# Patient Record
Sex: Female | Born: 1975 | Race: Black or African American | Hispanic: No | Marital: Married | Smoking: Never smoker
Health system: Southern US, Community
[De-identification: ages and names within clinical notes are randomized; demographics above are authoritative.]

## PROBLEM LIST (undated history)

## (undated) DIAGNOSIS — N289 Disorder of kidney and ureter, unspecified: Secondary | ICD-10-CM

## (undated) DIAGNOSIS — D649 Anemia, unspecified: Secondary | ICD-10-CM

## (undated) DIAGNOSIS — I10 Essential (primary) hypertension: Secondary | ICD-10-CM

## (undated) DIAGNOSIS — E785 Hyperlipidemia, unspecified: Secondary | ICD-10-CM

## (undated) DIAGNOSIS — N200 Calculus of kidney: Secondary | ICD-10-CM

## (undated) DIAGNOSIS — R7303 Prediabetes: Secondary | ICD-10-CM

## (undated) HISTORY — PX: EYE SURGERY: SHX253

## (undated) HISTORY — DX: Anemia, unspecified: D64.9

## (undated) HISTORY — PX: OTHER SURGICAL HISTORY: SHX169

## (undated) HISTORY — DX: Hyperlipidemia, unspecified: E78.5

---

## 1997-09-16 HISTORY — PX: BREAST REDUCTION SURGERY: SHX8

## 1998-06-12 ENCOUNTER — Ambulatory Visit (HOSPITAL_BASED_OUTPATIENT_CLINIC_OR_DEPARTMENT_OTHER): Admission: RE | Admit: 1998-06-12 | Discharge: 1998-06-12 | Payer: Self-pay | Admitting: Specialist

## 1998-10-30 ENCOUNTER — Emergency Department (HOSPITAL_COMMUNITY): Admission: EM | Admit: 1998-10-30 | Discharge: 1998-10-30 | Payer: Self-pay | Admitting: Emergency Medicine

## 2000-01-16 ENCOUNTER — Encounter: Admission: RE | Admit: 2000-01-16 | Discharge: 2000-01-16 | Payer: Self-pay | Admitting: Specialist

## 2000-01-16 ENCOUNTER — Encounter: Payer: Self-pay | Admitting: Specialist

## 2001-04-10 ENCOUNTER — Emergency Department (HOSPITAL_COMMUNITY): Admission: EM | Admit: 2001-04-10 | Discharge: 2001-04-10 | Payer: Self-pay | Admitting: *Deleted

## 2001-04-10 ENCOUNTER — Encounter: Payer: Self-pay | Admitting: Emergency Medicine

## 2002-12-14 ENCOUNTER — Other Ambulatory Visit: Admission: RE | Admit: 2002-12-14 | Discharge: 2002-12-14 | Payer: Self-pay | Admitting: *Deleted

## 2002-12-25 ENCOUNTER — Inpatient Hospital Stay (HOSPITAL_COMMUNITY): Admission: AD | Admit: 2002-12-25 | Discharge: 2002-12-25 | Payer: Self-pay | Admitting: *Deleted

## 2002-12-25 ENCOUNTER — Encounter: Payer: Self-pay | Admitting: *Deleted

## 2003-07-18 ENCOUNTER — Encounter: Admission: RE | Admit: 2003-07-18 | Discharge: 2003-07-18 | Payer: Self-pay | Admitting: Obstetrics and Gynecology

## 2003-07-18 ENCOUNTER — Inpatient Hospital Stay (HOSPITAL_COMMUNITY): Admission: AD | Admit: 2003-07-18 | Discharge: 2003-07-18 | Payer: Self-pay | Admitting: *Deleted

## 2003-08-05 ENCOUNTER — Encounter (INDEPENDENT_AMBULATORY_CARE_PROVIDER_SITE_OTHER): Payer: Self-pay | Admitting: Specialist

## 2003-08-05 ENCOUNTER — Inpatient Hospital Stay (HOSPITAL_COMMUNITY): Admission: RE | Admit: 2003-08-05 | Discharge: 2003-08-08 | Payer: Self-pay | Admitting: Obstetrics and Gynecology

## 2003-08-09 ENCOUNTER — Inpatient Hospital Stay (HOSPITAL_COMMUNITY): Admission: AD | Admit: 2003-08-09 | Discharge: 2003-08-09 | Payer: Self-pay | Admitting: Obstetrics and Gynecology

## 2003-08-13 ENCOUNTER — Inpatient Hospital Stay (HOSPITAL_COMMUNITY): Admission: AD | Admit: 2003-08-13 | Discharge: 2003-08-13 | Payer: Self-pay | Admitting: Obstetrics and Gynecology

## 2003-09-06 ENCOUNTER — Other Ambulatory Visit: Admission: RE | Admit: 2003-09-06 | Discharge: 2003-09-06 | Payer: Self-pay | Admitting: Obstetrics and Gynecology

## 2004-09-16 LAB — CONVERTED CEMR LAB: Pap Smear: NORMAL

## 2008-09-16 DIAGNOSIS — I10 Essential (primary) hypertension: Secondary | ICD-10-CM

## 2008-09-16 HISTORY — DX: Essential (primary) hypertension: I10

## 2009-09-04 ENCOUNTER — Emergency Department (HOSPITAL_BASED_OUTPATIENT_CLINIC_OR_DEPARTMENT_OTHER): Admission: EM | Admit: 2009-09-04 | Discharge: 2009-09-04 | Payer: Self-pay | Admitting: Emergency Medicine

## 2009-09-07 ENCOUNTER — Ambulatory Visit: Payer: Self-pay | Admitting: Internal Medicine

## 2009-09-07 DIAGNOSIS — I10 Essential (primary) hypertension: Secondary | ICD-10-CM

## 2009-09-07 HISTORY — DX: Essential (primary) hypertension: I10

## 2009-10-11 ENCOUNTER — Ambulatory Visit: Payer: Self-pay | Admitting: Family Medicine

## 2009-10-11 DIAGNOSIS — E785 Hyperlipidemia, unspecified: Secondary | ICD-10-CM | POA: Insufficient documentation

## 2009-10-11 DIAGNOSIS — E669 Obesity, unspecified: Secondary | ICD-10-CM

## 2009-10-11 HISTORY — DX: Obesity, unspecified: E66.9

## 2009-10-11 HISTORY — DX: Hyperlipidemia, unspecified: E78.5

## 2009-11-22 ENCOUNTER — Ambulatory Visit: Payer: Self-pay | Admitting: Family Medicine

## 2009-11-23 LAB — CONVERTED CEMR LAB
BUN: 12 mg/dL (ref 6–23)
CO2: 30 meq/L (ref 19–32)
Calcium: 9.2 mg/dL (ref 8.4–10.5)
Chloride: 106 meq/L (ref 96–112)
Cholesterol: 197 mg/dL (ref 0–200)
Creatinine, Ser: 0.8 mg/dL (ref 0.4–1.2)
GFR calc non Af Amer: 106.93 mL/min (ref >60)
Glucose, Bld: 88 mg/dL (ref 70–99)
HDL: 40.4 mg/dL (ref >39.00)
LDL Cholesterol: 146 mg/dL — ABNORMAL HIGH (ref 0–99)
Potassium: 4.2 meq/L (ref 3.5–5.1)
Sodium: 138 meq/L (ref 135–145)
Total CHOL/HDL Ratio: 5
Triglycerides: 51 mg/dL (ref 0.0–149.0)
VLDL: 10.2 mg/dL (ref 0.0–40.0)

## 2009-12-01 ENCOUNTER — Encounter (HOSPITAL_BASED_OUTPATIENT_CLINIC_OR_DEPARTMENT_OTHER): Admission: RE | Admit: 2009-12-01 | Discharge: 2010-01-10 | Payer: Self-pay | Admitting: Internal Medicine

## 2009-12-07 ENCOUNTER — Ambulatory Visit: Payer: Self-pay | Admitting: Family Medicine

## 2009-12-07 DIAGNOSIS — J019 Acute sinusitis, unspecified: Secondary | ICD-10-CM

## 2009-12-07 HISTORY — DX: Acute sinusitis, unspecified: J01.90

## 2010-01-02 ENCOUNTER — Encounter: Payer: Self-pay | Admitting: Family Medicine

## 2010-01-08 ENCOUNTER — Ambulatory Visit: Payer: Self-pay | Admitting: Family Medicine

## 2010-01-08 DIAGNOSIS — L03039 Cellulitis of unspecified toe: Secondary | ICD-10-CM

## 2010-01-08 DIAGNOSIS — L02619 Cutaneous abscess of unspecified foot: Secondary | ICD-10-CM

## 2010-01-08 HISTORY — DX: Cutaneous abscess of unspecified foot: L03.039

## 2010-01-08 HISTORY — DX: Cutaneous abscess of unspecified foot: L02.619

## 2010-01-18 ENCOUNTER — Ambulatory Visit: Payer: Self-pay | Admitting: Family Medicine

## 2010-01-18 LAB — CONVERTED CEMR LAB
Cholesterol, target level: 200 mg/dL
HDL goal, serum: 40 mg/dL
LDL Goal: 160 mg/dL

## 2010-02-08 ENCOUNTER — Ambulatory Visit: Payer: Self-pay | Admitting: Family Medicine

## 2010-02-08 DIAGNOSIS — L600 Ingrowing nail: Secondary | ICD-10-CM

## 2010-02-08 HISTORY — DX: Ingrowing nail: L60.0

## 2010-02-09 ENCOUNTER — Encounter: Payer: Self-pay | Admitting: Family Medicine

## 2010-02-14 ENCOUNTER — Ambulatory Visit: Payer: Self-pay | Admitting: Family Medicine

## 2010-02-14 DIAGNOSIS — J029 Acute pharyngitis, unspecified: Secondary | ICD-10-CM | POA: Insufficient documentation

## 2010-02-14 HISTORY — DX: Acute pharyngitis, unspecified: J02.9

## 2010-02-14 LAB — CONVERTED CEMR LAB: Rapid Strep: NEGATIVE

## 2010-07-16 ENCOUNTER — Ambulatory Visit: Payer: Self-pay | Admitting: Family Medicine

## 2010-07-16 DIAGNOSIS — J209 Acute bronchitis, unspecified: Secondary | ICD-10-CM

## 2010-07-16 HISTORY — DX: Acute bronchitis, unspecified: J20.9

## 2010-07-19 ENCOUNTER — Ambulatory Visit: Payer: Self-pay | Admitting: Family Medicine

## 2010-07-27 ENCOUNTER — Ambulatory Visit: Payer: Self-pay | Admitting: Family Medicine

## 2010-07-27 DIAGNOSIS — M549 Dorsalgia, unspecified: Secondary | ICD-10-CM

## 2010-07-27 HISTORY — DX: Dorsalgia, unspecified: M54.9

## 2010-08-03 ENCOUNTER — Ambulatory Visit: Payer: Self-pay | Admitting: Family Medicine

## 2010-08-03 DIAGNOSIS — R51 Headache: Secondary | ICD-10-CM

## 2010-08-03 DIAGNOSIS — R209 Unspecified disturbances of skin sensation: Secondary | ICD-10-CM

## 2010-08-03 DIAGNOSIS — R519 Headache, unspecified: Secondary | ICD-10-CM | POA: Insufficient documentation

## 2010-08-03 HISTORY — DX: Headache: R51

## 2010-08-03 HISTORY — DX: Unspecified disturbances of skin sensation: R20.9

## 2010-08-07 ENCOUNTER — Encounter: Admission: RE | Admit: 2010-08-07 | Discharge: 2010-08-07 | Payer: Self-pay | Admitting: Family Medicine

## 2010-08-08 ENCOUNTER — Telehealth: Payer: Self-pay | Admitting: Family Medicine

## 2010-08-27 ENCOUNTER — Encounter: Payer: Self-pay | Admitting: Family Medicine

## 2010-10-16 NOTE — Assessment & Plan Note (Signed)
Summary: consult re: head, neck and upper back pain for 1 wk/cjr   Vital Signs:  Patient profile:   35 year old female Menstrual status:  irregular Temp:     98.7 degrees F oral BP sitting:   110 / 80  (left arm) Cuff size:   large  Vitals Entered By: Sid Falcon LPN (July 27, 2010 11:02 AM)  History of Present Illness: Here with upper back and neck muscle tightness and soreness past few days. Started after falling asleep on couch. No headache.  No radiculopathy symptoms. Tried NSAIDs without relief.  Moist heat helps some. URI sxs have improved.  Allergies: 1)  Asa 2)  Santyl (Collagenase)  Past History:  Past Medical History: Last updated: 10/11/2009 Hypertension Frequent headaches Hyperlipidemia  Review of Systems  The patient denies fever, hoarseness, chest pain, syncope, dyspnea on exertion, headaches, and muscle weakness.    Physical Exam  General:  Well-developed,well-nourished,in no acute distress; alert,appropriate and cooperative throughout examination Head:  Normocephalic and atraumatic without obvious abnormalities. No apparent alopecia or balding. Mouth:  Oral mucosa and oropharynx without lesions or exudates.  Teeth in good repair. Neck:  full ROM.  Tender paracervical muscles bilateral and trapezius muscles. Lungs:  Normal respiratory effort, chest expands symmetrically. Lungs are clear to auscultation, no crackles or wheezes. Heart:  Normal rate and regular rhythm. S1 and S2 normal without gallop, murmur, click, rub or other extra sounds. Neurologic:  alert & oriented X3, cranial nerves II-XII intact, and strength normal in all extremities.     Impression & Recommendations:  Problem # 1:  BACK PAIN, UPPER (ICD-724.5) suspect muscular.  Muscle relaxer and she is scheduled for muscle massage. Her updated medication list for this problem includes:    Cyclobenzaprine Hcl 5 Mg Tabs (Cyclobenzaprine hcl) .Marland Kitchen... 1-2 by mouth q 8 hours as needed muscle  spasm  Complete Medication List: 1)  Bactroban 2 % Oint (Mupirocin) .... Apply to affected rash two times a day 2)  Allerx-d 120-2.5 Mg Xr12h-tab (Pseudoephedrine-methscopolamin) .... One by mouth two times a day 3)  Cyclobenzaprine Hcl 5 Mg Tabs (Cyclobenzaprine hcl) .Marland Kitchen.. 1-2 by mouth q 8 hours as needed muscle spasm  Patient Instructions: 1)  Continue with moist heat and muscle massage would likely be helpful. Prescriptions: CYCLOBENZAPRINE HCL 5 MG TABS (CYCLOBENZAPRINE HCL) 1-2 by mouth q 8 hours as needed muscle spasm  #30 x 1   Entered and Authorized by:   Evelena Peat MD   Signed by:   Evelena Peat MD on 07/27/2010   Method used:   Electronically to        [id number] ([facility]       [address]       [place], [place]  [address]       Ph: [phone] or [phone]       Fax: [phone]   RxID:   [id number]    Orders Added: 1)  Est. Patient Level III [[address]]

## 2010-10-16 NOTE — Assessment & Plan Note (Signed)
Summary: new to [facility] per dr Paublo Warshawsky//ccm   Vital Signs:  Patient profile:   35 year old female Menstrual status:  irregular LMP:     08/23/2009 Height:      64.75 inches Weight:      273 pounds Temp:     98.8 degrees F oral Pulse rate:   80 / minute Pulse rhythm:   regular Resp:     12 per minute BP sitting:   130 / 82  (left arm) Cuff size:   large  Vitals Entered By: Sid Falcon LPN (October 11, 2009 11:23 AM) CC: New pt to establish, BP concerns, Rt leg dog bite, Hypertension Management Is Patient Diabetic? No LMP (date): 08/23/2009     Menstrual Status irregular Enter LMP: 08/23/2009 Last PAP Result normal   History of Present Illness: Patient here to establish care.  Patient new to this clinic. Reported history of hypertension. At one point treated with Avapro but apparently still had high blood pressure readings. Currently on no medications. Not monitoring blood pressure regularly at home. No regular exercise but plans to start soon.  REported hx hyperlipidemia but  labs not checked in few years.  Patient has history of breast reduction surgery 1999 and C-section 2004. Reported history of hyperlipidemia. Also allergy to aspirin but can take Advil and Aleve without difficulty.  Family history signif for both parents with hyperlipidemia and hypertension. Mother with type 2 diabetes.  Pt reports possibly mildly elevated glucose in past but not dxed with diabetes.  No urine freq or thirst.  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 No history of alcohol use. Nonsmoker.  Hypertension History:      She denies headache, chest pain, palpitations, dyspnea with exertion, orthopnea, PND, peripheral edema, visual symptoms, and syncope.        Positive major cardiovascular risk factors include hyperlipidemia and hypertension.  Negative major cardiovascular risk factors include female age less than 39 years old, no history of diabetes, negative family history  for ischemic heart disease, and non-tobacco-user status.        Further assessment for target organ damage reveals no history of ASHD, cardiac end-organ damage (CHF/LVH), stroke/TIA, peripheral vascular disease, renal insufficiency, or hypertensive retinopathy.     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 Alcohol use-no Drug use-no Regular exercise-yes pregnancies 2 1 live birth 1 miscarriage  Risk Factors: Alcohol Use: 0 (09/07/2009) Exercise: yes (09/07/2009)  Risk Factors: Smoking Status: never (09/07/2009)  Past Medical History: Hypertension Frequent headaches Hyperlipidemia  Past Surgical History: Caesarean section 2004 Breast reduction 1999  Family History: Family History Diabetes 1st degree relative, mother, grandmother Family History High cholesterol, both parents Family History Hypertension, both parents, grandmother Family History of Arthritis, mother Colon cancer, uncle Stroke, grandmother  Social History: Occupation: Airline pilot Divorced Never Smoked Alcohol use-no Drug use-no Regular exercise-yes pregnancies 2 1 live birth 1 miscarriage  Review of Systems       The patient complains of weight gain.  The patient denies anorexia, fever, weight loss, chest pain, syncope, dyspnea on exertion, peripheral edema, headaches, and vision loss.    Physical Exam  General:  patient is alert pleasant obese in no distress Eyes:  No corneal or conjunctival  inflammation noted. EOMI. Perrla. Funduscopic exam benign, without hemorrhages, exudates or papilledema. Vision grossly  normal. Mouth:  Oral mucosa and oropharynx without lesions or exudates.  Teeth in good repair. Neck:  No deformities, masses, or tenderness noted. Lungs:  Normal respiratory effort, chest expands symmetrically. Lungs are clear to auscultation, no crackles or wheezes. Heart:  Normal rate and regular rhythm. S1 and S2 normal without gallop, murmur, click, rub or other extra sounds. Extremities:  no edema   Impression & Recommendations:  Problem # 1:  HYPERTENSION (ICD-401.9) BP actually stable today off meds. Discussed lifestyle interventions in detail.  Needs to lose weight and establish exercise. The following medications were removed from the medication list:    Azor 5-20 Mg Tabs (Amlodipine-olmesartan) ..... One by mouth once daily for high blood pressure  Problem # 2:  HYPERLIPIDEMIA (ICD-272.4) F/U within 2 months for fasting labs incl lipids.  Problem # 3:  OBESITY (ICD-278.00) At risk for type 2 dm and multiple other co-morbidities.  Pt to start exercise.  Extensive nutrition counseling in past.  Hypertension Assessment/Plan:      The patient's hypertensive risk group is category B: At least one risk factor (excluding diabetes) with no target organ damage.  Today's blood pressure is 130/82.  Her blood pressure goal is < 140/90.  Patient Instructions: 1)  Please schedule a follow-up appointment in 2 months.  2)  BMP prior to visit, ICD-9: 401.9 3)  Lipid panel prior to visit ICD-9 : 272.4 4)  Limit your Sodium(salt) .  5)  It is important that you exercise reguarly at least 20 minutes 5 times a week. If you develop chest pain, have severe difficulty breathing, or feel very tired, stop exercising immediately and seek medical attention.  6)  You need to lose weight. Consider a lower calorie diet and regular exercise.   Preventive Care Screening  Pap Smear:    Date:  09/16/2004    Results:  normal

## 2010-10-16 NOTE — Assessment & Plan Note (Signed)
Summary: SINUS ISSUES // RS   Vital Signs:  Patient profile:   35 year old female Menstrual status:  irregular Temp:     98.1 degrees F oral BP sitting:   130 / 94  (left arm) Cuff size:   large  Vitals Entered By: Sid Falcon LPN (July 16, 2010 1:53 PM)  History of Present Illness: Patient is seen with a one-week history of cough productive of brown sputum. No fever. Persistent postnasal sinus drainage. Ringing sensation in both ears but no hearing loss. Robitussin without relief. Nonsmoker.  No alleviating factors.  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  Otoscopic examination reveals clear canals, tympanic membranes are intact bilaterally without bulging, retraction, inflammation or discharge. Hearing is grossly normal bilaterally. Mouth:  Oral mucosa and oropharynx without lesions or exudates.   Teeth in good repair. Neck:  No deformities, masses, or tenderness noted. Lungs:  Normal respiratory effort, chest expands symmetrically. Lungs are clear to auscultation, no crackles or wheezes. Heart:  Normal rate and regular rhythm. S1 and S2 normal without gallop, murmur, click, rub or other extra sounds.   Impression & Recommendations:  Problem # 1:  BRONCHITIS, ACUTE (ICD-466.0)  The following medications were removed from the medication list:    Cephalexin 500 Mg Caps (Cephalexin) ..... One by mouth three times a day for 10 days Her updated medication list for this problem includes:    Azithromycin 250 Mg Tabs (Azithromycin) .Marland Kitchen... 2 by mouth today then one by mouth once daily for 4 days    Hydrocodone-homatropine 5-1.5 Mg/74ml Syrp (Hydrocodone-homatropine) ..... One tsp by mouth q 4-6 hours as needed cough  Complete Medication List: 1)  Bactroban 2 % Oint (Mupirocin) .... Apply to affected rash two times a day 2)  Azithromycin 250 Mg Tabs (Azithromycin) .... 2 by mouth today then one by mouth once daily for 4 days 3)  Hydrocodone-homatropine 5-1.5 Mg/40ml Syrp (Hydrocodone-homatropine) .... One tsp by mouth q 4-6 hours as needed cough  Patient Instructions: 1)  Acute Bronchitis symptoms for less then 10 days are not  helped by antibiotics. Take over the counter cough medications. Call if no improvement in 5-7 days, sooner if increasing cough, fever, or new symptoms ( shortness of breath, chest pain) .  Prescriptions: HYDROCODONE-HOMATROPINE 5-1.5 MG/5ML SYRP (HYDROCODONE-HOMATROPINE) one tsp by mouth q 4-6 hours as needed cough  #120 ml x 0   Entered and Authorized by:   Evelena Peat MD   Signed by:   Evelena Peat MD on 07/16/2010   Method used:   Print then Give to Patient   RxID:   [id number] AZITHROMYCIN 250 MG TABS (AZITHROMYCIN) 2  by mouth today then one by mouth once daily for 4 days  #6 x 0   Entered and Authorized by:   Evelena Peat MD   Signed by:   Evelena Peat MD on 07/16/2010   Method used:   Electronically to        [id number]       [address]       [place], [place]  [address]       Ph: [phone] or [phone]       Fax: [phone]   RxID:   [id number]    Orders Added: 1)  Est. Patient Level III [[address]]

## 2010-10-16 NOTE — Assessment & Plan Note (Signed)
Summary: neck,shoulder and head pain/cjr   Vital Signs:  Patient profile:   35 year old female Menstrual status:  irregular Temp:     98.3 degrees F oral BP sitting:   110 / 84  (left arm) Cuff size:   large  Vitals Entered By: Sid Falcon LPN (August 03, 2010 3:25 PM)  History of Present Illness: Patient seen with persistent neck shoulder upper back and occipital pain. She's had some progressive occipital headache. Pain seems to radiate occipital area toward the upper thoracic region. She's tried muscle relaxer without much improvement also massage. She denies nausea, vomiting, or visual changes.  Patient also relates some migratory intermittent numbness right upper extremity and bilateral lower extremities. No incontinence or weakness. Has also had neurologic symptoms of intermittent tinnitus without hearing changes.  over the past week she's had some intermittent episodes of involuntary movements involving clonus type contraction of the upper extremity mostly right upper extremity. Again no weakness. She denies any symptoms of ataxia. No visual changes.  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    Physical Exam  General:  Well-developed,well-nourished,in no acute distress; alert,appropriate and cooperative throughout examination Head:  Normocephalic and atraumatic without obvious abnormalities. No apparent alopecia or balding. Eyes:  No corneal or conjunctival inflammation noted. EOMI. Perrla. Funduscopic exam benign, without hemorrhages, exudates or papilledema. Vision grossly normal. Ears:  External ear exam shows no significant lesions or deformities.  Otoscopic examination reveals clear canals, tympanic membranes are intact bilaterally without bulging, retraction, inflammation or discharge. Hearing is grossly normal bilaterally. Mouth:  Oral mucosa and oropharynx without lesions or exudates.  Teeth in good repair. Neck:  No deformities, masses, or tenderness noted. Lungs:  Normal respiratory effort, chest expands symmetrically. Lungs are clear to auscultation, no crackles or wheezes. Heart:  Normal rate and regular rhythm. S1 and S2 normal without gallop, murmur, click, rub or other extra sounds. Neurologic:  alert & oriented X3, cranial nerves II-XII intact, strength normal in all extremities, gait normal, DTRs symmetrical and normal, and finger-to-nose normal.   Psych:  normally interactive, good eye contact, not anxious appearing, and not depressed appearing.     Impression & Recommendations:  Problem # 1:  HEADACHE (ICD-784.0) progressive atypical type headache. Orders: Radiology Referral (Radiology)  Her updated medication list for this problem includes:    Naproxen 500 Mg Tabs (Naproxen) ..... One by mouth two times a day with food as needed headache and back pain  Problem # 2:   PARESTHESIA (ICD-782.0) Assessment: New She has some migratory, intermittnent numbness.  In combination with above, will check MRI to further investigate. Orders: Radiology Referral (Radiology)  Problem # 3:  BACK  PAIN, UPPER (ICD-724.5)  Her updated medication list for this problem includes:    Cyclobenzaprine Hcl 5 Mg Tabs (Cyclobenzaprine hcl) .Marland Kitchen... 1-2 by mouth q 8 hours as needed muscle spasm    Naproxen 500 Mg Tabs (Naproxen) ..... One by mouth two times a day with food as needed headache and back pain  Complete Medication List: 1)  Bactroban 2 % Oint (Mupirocin) .... Apply to affected rash two times a day 2)  Allerx-d 120-2.5 Mg Xr12h-tab (Pseudoephedrine-methscopolamin) .... One by mouth two times a day 3)  Cyclobenzaprine Hcl 5 Mg Tabs (Cyclobenzaprine hcl) .Marland Kitchen.. 1-2 by mouth q 8 hours as needed muscle spasm 4)  Naproxen 500 Mg Tabs (Naproxen) .... One by mouth two times a day with food as needed headache and back pain  Patient Instructions: 1)  We will call you regarding MRI appt. Prescriptions: NAPROXEN 500 MG TABS (NAPROXEN) one by mouth two times a day with food as needed headache and back pain  #40 x 0   Entered and Authorized by:   Evelena Peat MD   Signed by:   Evelena Peat MD on 08/03/2010   Method used:   Electronically to        [id number]       [address]       [place], [place]  [address]       Ph: [phone] or [phone]       Fax: [phone]   RxID:   [id number]    Orders Added: 1)  Radiology Referral [Radiology] 2)  Est. Patient Level IV [14431]

## 2010-10-16 NOTE — Assessment & Plan Note (Signed)
Summary: not felling well//ccm   Vital Signs:  Patient profile:   35 year old female Menstrual status:  irregular Temp:     98.3 degrees F oral BP sitting:   110 / 80  (left arm) Cuff size:   large  Vitals Entered By: Sid Falcon LPN (July 19, 2010 12:03 PM)  History of Present Illness: Just seen with acute bronchitis. Cough is somewhat better but in today b/o some tinnitus which is intermittent.  No hearing loss, ear pain, or vertigo. Does have signif nasal cong and some difficulty sleeping sec to  ringing in ears.  No ASA use.  Allergies: 1)  Asa 2)  Santyl (Collagenase)  Past History:  Past Medical History: Last updated: 10/11/2009 Hypertension Frequent headaches Hyperlipidemia  Review of Systems  The patient denies fever, vision loss, decreased hearing, hoarseness, and chest pain.    Physical Exam  General:  Well-developed,well-nourished,in no acute distress; alert,appropriate and cooperative throughout examination Eyes:  No corneal or conjunctival inflammation noted. EOMI. Perrla. Funduscopic exam benign, without hemorrhages, exudates or papilledema. Vision grossly normal. Ears:  External ear exam shows no significant lesions or deformities.  Otoscopic examination reveals clear canals, tympanic membranes are intact bilaterally without bulging, retraction, inflammation or discharge. Hearing is grossly normal bilaterally. Nose:  External nasal examination shows no deformity or inflammation. Nasal mucosa are pink and moist without lesions or exudates. Mouth:  Oral mucosa and oropharynx without lesions or exudates.  Teeth in good repair. Lungs:  Normal respiratory effort, chest expands symmetrically. Lungs are clear to auscultation, no crackles or wheezes. Heart:  Normal rate and regular rhythm. S1 and S2 normal without gallop, murmur, click, rub or other extra sounds.   Impression & Recommendations:  Problem # 1:  TINNITUS (ICD-388.30) ?related to recent URI.   No hearing loss.  Try Allerx D for congestion  Complete Medication List: 1)  Bactroban 2 % Oint (Mupirocin) .... Apply to affected rash two times a day 2)  Azithromycin 250 Mg Tabs (Azithromycin) .... 2 by mouth today then one by mouth once daily for 4 days 3)  Hydrocodone-homatropine 5-1.5 Mg/64ml Syrp (Hydrocodone-homatropine) .... One tsp by mouth q 4-6 hours as needed cough 4)  Allerx-d 120-2.5 Mg Xr12h-tab (Pseudoephedrine-methscopolamin) .... One by mouth two times a day  Patient Instructions: 1)  Call if any hearing changes or any persistent tinnitus. Prescriptions: ALLERX-D 120-2.5 MG XR12H-TAB (PSEUDOEPHEDRINE-METHSCOPOLAMIN) one by mouth two times a day  #20 x 0   Entered and Authorized by:   Evelena Peat MD   Signed by:   Evelena Peat MD on 07/19/2010   Method used:   [phone][facility]ail[facility]       [address]       [place], [place]  [address]       Ph: [phone] or [phone]       Fax: [phone]   RxID:   [id number]    Orders Added: 1)  Est. Patient Level III [[address]644]

## 2010-10-16 NOTE — Assessment & Plan Note (Signed)
Summary: NEW [facility] PT--ER/MON--DOG BITE-R THIGH/FINGER WOUND-# PKG/OFF-STC   Vital Signs:  Patient profile:   35 year old female Height:      64 inches Weight:      265.50 pounds BMI:     45.74 O2 Sat:      97 % on Room air Temp:     98.6 degrees F oral Pulse rate:   80 / minute Pulse rhythm:   regular Resp:     16 per minute BP sitting:   140 / 100  (left arm)  Nutrition Counseling: Patient's BMI is greater than 25 and therefore counseled on weight management options.  O2 Flow:  Room air CC: NP est care/examine dog bite on right hand and right thigh. Pt was bitten on 09-04-09. Has hx of HTN/denies HTN symptoms./kb, Hypertension Management   Primary Care Provider:  Etta Grandchild MD  CC:  NP est care/examine dog bite on right hand and right thigh. Pt was bitten on 09-04-09. Has hx of HTN/denies HTN symptoms./kb and Hypertension Management.  History of Present Illness: New to me for treatment of Htn. and to check dog bite on right anerior thigh she sustained 3 days ago at a client's house. She was seen in the ER and treated. The area is healing well with no pain, redness, swelling, or drainage.  Hypertension History:      She denies headache, chest pain, palpitations, dyspnea with exertion, orthopnea, PND, peripheral edema, visual symptoms, neurologic problems, and syncope.        Positive major cardiovascular risk factors include hypertension.  Negative major cardiovascular risk factors include female age less than 61 years old, no history of diabetes or hyperlipidemia, negative family history for ischemic heart disease, and non-tobacco-user status.        Further assessment for target organ damage reveals no history of ASHD, cardiac end-organ damage (CHF/LVH), stroke/TIA, peripheral vascular disease, renal insufficiency, or hypertensive retinopathy.     Preventive Screening-Counseling & Management  Alcohol-Tobacco     Alcohol drinks/day: 0     Smoking Status: never   Caffeine-Diet-Exercise     Does Patient Exercise: yes  Hep-HIV-STD-Contraception     Hepatitis Risk: no risk noted     HIV Risk: no risk noted     STD Risk: no risk noted      Sexual History:  not sexually active.        Drug Use:  no.        Blood Transfusions:  no.    Current Medications (verified): 1)  Augmentin 875-125 Mg Tabs (Amoxicillin-Pot Clavulanate) .Marland Kitchen.. 1 By Mouth Bid  Allergies (verified): 1)  ! Asa  Past History:  Past Medical History: Hypertension  Past Surgical History: Caesarean section Breast reduction  Family History: Family History Diabetes 1st degree relative Family History High cholesterol Family History Hypertension  Social History: Occupation: Airline pilot Divorced Never Smoked Alcohol use-no Drug use-no Regular exercise-yes Smoking Status:  never Hepatitis Risk:  no risk noted HIV Risk:  no risk noted STD Risk:  no risk noted Sexual History:  not sexually active Blood Transfusions:  no Drug Use:  no Does Patient Exercise:  yes  Review of Systems  The patient denies anorexia, fever, abdominal pain, hematuria, difficulty walking, and enlarged lymph nodes.    Physical Exam  General:  alert, well-developed, well-nourished, well-hydrated, appropriate dress, cooperative to examination, good hygiene, and overweight-appearing.   Head:  normocephalic and atraumatic.   Eyes:  vision grossly intact, pupils equal, pupils round, and  a-v nicking.   Ears:  R ear normal and L ear normal.   Mouth:  Oral mucosa and oropharynx without lesions or exudates.  Teeth in good repair. Neck:  supple, full ROM, no masses, no thyromegaly, no thyroid nodules or tenderness, no JVD, no cervical lymphadenopathy, and no neck tenderness.   Lungs:  normal respiratory effort, no intercostal retractions, no accessory muscle use, normal breath sounds, no dullness, no fremitus, no crackles, and no wheezes.   Heart:  normal rate, regular rhythm, no murmur, no  gallop, no rub, and no JVD.   Abdomen:  soft, non-tender, normal bowel sounds, no hepatomegaly, and no splenomegaly.   Msk:  right anterior thigh has an area that shows abrasion and puncture woulds that are healing well with faint scab but no exudate, warmth, erythema, ttp, fluctuance, fb Pulses:  R and L carotid,radial,femoral,dorsalis pedis and posterior tibial pulses are full and equal bilaterally Extremities:  No clubbing, cyanosis, edema, or deformity noted with normal full range of motion of all joints.   Neurologic:  No cranial nerve deficits noted. Station and gait are normal. Plantar reflexes are down-going bilaterally. DTRs are symmetrical throughout. Sensory, motor and coordinative functions appear intact. Skin:  turgor normal, color normal, no rashes, no suspicious lesions, no ecchymoses, no ulcerations, and no edema.   Axillary Nodes:  no R axillary adenopathy and no L axillary adenopathy.   Psych:  Cognition and judgment appear intact. Alert and cooperative with normal attention span and concentration. No apparent delusions, illusions, hallucinations   Impression & Recommendations:  Problem # 1:  ANIMAL BITE (ICD-919.8) continue augmentin and neosporin and keep area clean  Problem # 2:  ESSENTIAL HYPERTENSION, BENIGN (ICD-401.1) Assessment: Deteriorated  Her updated medication list for this problem includes:    Azor 5-20 Mg Tabs (Amlodipine-olmesartan) ..... One by mouth once daily for high blood pressure  BP today: 140/100  10 Yr Risk Heart Disease: Not enough information  Complete Medication List: 1)  Augmentin 875-125 Mg Tabs (Amoxicillin-pot clavulanate) .Marland Kitchen.. 1 by mouth bid 2)  Azor 5-20 Mg Tabs (Amlodipine-olmesartan) .... One by mouth once daily for high blood pressure  Hypertension Assessment/Plan:      The patient's hypertensive risk group is category A: No risk factors and no target organ damage.  Today's blood pressure is 140/100.  Her blood pressure goal is <  140/90.  Patient Instructions: 1)  Please schedule a follow-up appointment in 2 months. 2)  It is important that you exercise regularly at least 20 minutes 5 times a week. If you develop chest pain, have severe difficulty breathing, or feel very tired , stop exercising immediately and seek medical attention. 3)  You need to lose weight. Consider a lower calorie diet and regular exercise.  4)  If you could be exposed to sexually transmitted diseases, you should use a condom. 5)  If you are having sex and you or your partner don't want a child, use contraception. 6)  Check your Blood Pressure regularly. If it is above 140/90: you should make an appointment. Prescriptions: AZOR 5-20 MG TABS (AMLODIPINE-OLMESARTAN) One by mouth once daily for high blood pressure  #70 x 0   Entered and Authorized by:   Etta Grandchild MD   Signed by:   Etta Grandchild MD on 09/07/2009   Method used:   Samples Given   RxID:   [id number]    Immunization History:  Tetanus/Td Immunization History:    Tetanus/Td:  historical (09/04/2009)

## 2010-10-16 NOTE — Assessment & Plan Note (Signed)
Summary: 2 month rov/njr/pt rescd//ccm/pt rescd//ccm/pt rsc from bmp/c...   Vital Signs:  Patient profile:   35 year old female Menstrual status:  irregular Weight:      270 pounds Temp:     98.0 degrees F oral BP sitting:   110 / 84  (left arm) Cuff size:   large  Vitals Entered By: Sid Falcon LPN (Jan 18, 1609 9:10 AM) CC: 2 month follow-up, Lipid Management   History of Present Illness: Patient here to followup recent elevated blood pressure and hyperlipidemia. HDL was 40 with total cholesterol 197 and LDL 146. No family history premature CAD. Patient not exercising regularly.  never treated for hypertension. Denies any headaches or dizziness.  Strong FH of hypertension and diabetes.  Lipid Management History:      Positive NCEP/ATP III risk factors include hypertension.  Negative NCEP/ATP III risk factors include female age less than 29 years old, non-diabetic, no family history for ischemic heart disease, non-tobacco-user status, no ASHD (atherosclerotic heart disease), no prior stroke/TIA, no peripheral vascular disease, and no history of aortic aneurysm.     Allergies: 1)  Asa 2)  Santyl (Collagenase)  Past History:  Past Medical History: Last updated: 10/11/2009 Hypertension Frequent headaches Hyperlipidemia  Past Surgical History: Last updated: 10/11/2009 Caesarean section 2004 Breast reduction 1999  Family History: Last updated: 10/11/2009 Family History Diabetes 1st degree relative, mother, grandmother Family History High cholesterol, both parents Family History Hypertension, both parents, grandmother Family History of Arthritis, mother Colon cancer, uncle Stroke, grandmother  Social History: Last updated: 10/11/2009 Occupation: Psychotherapist Divorced Never Smoked Alcohol use-no Drug use-no Regular exercise-yes pregnancies 2 1 live birth 1 miscarriage  Risk Factors: Alcohol Use: 0 (01/08/2010) Exercise: yes (09/07/2009)  Risk  Factors: Smoking Status: never (01/08/2010) PMH-FH-SH reviewed for relevance  Review of Systems  The patient denies weight loss, weight gain, chest pain, syncope, dyspnea on exertion, peripheral edema, prolonged cough, headaches, hemoptysis, abdominal pain, and muscle weakness.    Physical Exam  General:  Well-developed,well-nourished,in no acute distress; alert,appropriate and cooperative throughout examination Neck:  No deformities, masses, or tenderness noted. Lungs:  Normal respiratory effort, chest expands symmetrically. Lungs are clear to auscultation, no crackles or wheezes. Heart:  Normal rate and regular rhythm. S1 and S2 normal without gallop, murmur, click, rub or other extra sounds. Extremities:  No clubbing, cyanosis, edema, or deformity noted with normal full range of motion of all joints.   Cervical Nodes:  No lymphadenopathy noted Psych:  normally interactive, good eye contact, not anxious appearing, and not depressed appearing.     Impression & Recommendations:  Problem # 1:  HYPERLIPIDEMIA (ICD-272.4) information given regarding reduction of saturated/trans fats. Work on weight loss and exercise. Consider repeat one year.  Problem # 2:  HYPERTENSION (ICD-401.9) Assessment: Improved continue to monitor. Discussed sodium reduction.  Establish regular aerobic exercise.  Complete Medication List: 1)  Amoxicillin-pot Clavulanate 875-125 Mg Tabs (Amoxicillin-pot clavulanate) .... One by mouth two times a day for 10 days  Lipid Assessment/Plan:      Based on NCEP/ATP III, the patient's risk factor category is "0-1 risk factors".  The patient's lipid goals are as follows: Total cholesterol goal is 200; LDL cholesterol goal is 160; HDL cholesterol goal is 40; Triglyceride goal is 150.    Patient Instructions: 1)  It is important that you exercise reguarly at least 20 minutes 5 times a week. If you develop chest pain, have severe difficulty breathing, or feel very tired,  stop exercising  immediately and seek medical attention.  2)  You need to lose weight. Consider a lower calorie diet and regular exercise.  3)  Please schedule a follow-up appointment in 1 year.

## 2010-10-16 NOTE — Assessment & Plan Note (Signed)
Summary: SINUS DRAINAGE, COUGH, CONGESTION // RS   Vital Signs:  Patient profile:   35 year old female Menstrual status:  irregular Temp:     98.7 degrees F oral BP sitting:   160 / 110  (left arm) Cuff size:   large  Vitals Entered By: Sid Falcon LPN (December 07, 2009 12:19 PM)  Serial Vital Signs/Assessments:  Time      Position  BP       Pulse  Resp  Temp     By                     142/100                        Evelena Peat MD  CC: cough, congestion, laryngitis, URI symptoms, Hypertension Management   History of Present Illness:       This is a 35 year old woman who presents with URI symptoms.  The patient complains of nasal congestion, purulent nasal discharge, and productive cough, but denies sore throat and earache.  The patient denies fever, dyspnea, and wheezing.  The patient also reports sneezing.  The patient denies the following risk factors for Strep sinusitis: unilateral facial pain, unilateral nasal discharge, and tooth pain.    Hypertension History:      She denies headache, chest pain, palpitations, dyspnea with exertion, orthopnea, PND, peripheral edema, visual symptoms, neurologic problems, syncope, and side effects from treatment.  Further comments include: currently not on any BP meds.        Positive major cardiovascular risk factors include hyperlipidemia and hypertension.  Negative major cardiovascular risk factors include female age less than 62 years old, no history of diabetes, negative family history for ischemic heart disease, and non-tobacco-user status.        Further assessment for target organ damage reveals no history of ASHD, cardiac end-organ damage (CHF/LVH), stroke/TIA, peripheral vascular disease, renal insufficiency, or hypertensive retinopathy.     Allergies: 1)  Asa  Past History:  Past Medical History: Last updated: 10/11/2009 Hypertension Frequent headaches Hyperlipidemia PMH reviewed for relevance  Review of Systems  The  patient denies anorexia, fever, weight loss, chest pain, syncope, dyspnea on exertion, peripheral edema, prolonged cough, headaches, and hemoptysis.    Physical Exam  General:  Well-developed,well-nourished,in no acute distress; alert,appropriate and cooperative throughout examination Head:  Normocephalic and atraumatic without obvious abnormalities. No apparent alopecia or balding. Ears:  External ear exam shows no significant lesions or deformities.  Otoscopic examination reveals clear canals, tympanic membranes are intact bilaterally without bulging, retraction, inflammation or discharge. Hearing is grossly normal bilaterally. Nose:  External nasal examination shows no deformity or inflammation. Nasal mucosa are pink and moist without lesions or exudates. Mouth:  Oral mucosa and oropharynx without lesions or exudates.  Teeth in good repair. Neck:  No deformities, masses, or tenderness noted. Lungs:  Normal respiratory effort, chest expands symmetrically. Lungs are clear to auscultation, no crackles or wheezes. Heart:  normal rate, regular rhythm, and no murmur.   Extremities:  no edema. Cervical Nodes:  No lymphadenopathy noted Psych:  normally interactive, good eye contact, not anxious appearing, and not depressed appearing.     Impression & Recommendations:  Problem # 1:  SINUSITIS, ACUTE (ICD-461.9) Assessment New  Her updated medication list for this problem includes:    Amoxicillin 875 Mg Tabs (Amoxicillin) ..... One by mouth two times a day for 10 days  Problem # 2:  HYPERTENSION (ICD-401.9) Assessment: Deteriorated need to get back on treatment.  Start Benicar 20 mg by mouth once daily with samples given and reassess one month.  Complete Medication List: 1)  Amoxicillin 875 Mg Tabs (Amoxicillin) .... One by mouth two times a day for 10 days  Hypertension Assessment/Plan:      The patient's hypertensive risk group is category B: At least one risk factor (excluding diabetes)  with no target organ damage.  Her calculated 10 year risk of coronary heart disease is 1 %.  Today's blood pressure is 160/110.  Her blood pressure goal is < 140/90.  Patient Instructions: 1)  Start Benicar 20 mg one by mouth daily 2)  Please schedule a follow-up appointment in 1 month.  3)  It is important that you exercise reguarly at least 20 minutes 5 times a week. If you develop chest pain, have severe difficulty breathing, or feel very tired, stop exercising immediately and seek medical attention.  4)  You need to lose weight. Consider a lower calorie diet and regular exercise.  Prescriptions: AMOXICILLIN 875 MG TABS (AMOXICILLIN) one by mouth two times a day for 10 days  #20 x 0   Entered and Authorized by:   Evelena Peat MD   Signed by:   Evelena Peat MD on 12/07/2009   Method used:   Electronically to        [id number] (retail)       [address]       [place], [place]  [address]       Ph: [phone] or [phone]       Fax: [phone]   RxID:   [id number]

## 2010-10-16 NOTE — Progress Notes (Signed)
Summary: REQUEST FOR RESULTS  Phone Note Call from Patient   Caller: Patient Summary of Call: Pt called to obtain results of MRI........ Pt can be reached at  [phone].  Initial call taken by: Debbra Riding,  August 08, 2010 2:28 PM  Follow-up for Phone Call        pt notified.  Normal MRI.  If symptoms persist, consider neurology consult. Follow-up by: Evelena Peat MD,  August 08, 2010 4:44 PM

## 2010-10-16 NOTE — Assessment & Plan Note (Signed)
Summary: R FOOT (TOE) PAIN (POSSIBLE INFECTION) // RS---PT [facility] // RS   Vital Signs:  Patient profile:   35 year old female Menstrual status:  irregular Height:      64.75 inches Weight:      268 pounds BMI:     45.11 Temp:     98.6 degrees F oral Pulse rate:   76 / minute Resp:     14 per minute BP sitting:   138 / 80  (left arm) Cuff size:   large  Vitals Entered By: Willy Eddy, LPN (January 08, 2010 4:08 PM) CC: c/o rt great toe infection- has been soaking in epsom salt without relief   History of Present Illness: Patient seen with right great toe infection.  Ingrown nail and about 9 days ago tried to dig out nail herself. Few days ago developed some redness, warmth, and purulent drainage which now has spread to the dorsum of the toe as well. Denies any systemic fever. Has tried hydrogen peroxide and soaking in Epsom salts without much improvement.  No prior history of MRSA. Allergy to aspirin  Preventive Screening-Counseling & Management  Alcohol-Tobacco     Alcohol drinks/day: 0     Smoking Status: never  Current Problems (verified): 1)  Sinusitis, Acute  (ICD-461.9) 2)  Obesity  (ICD-278.00) 3)  Hyperlipidemia  (ICD-272.4) 4)  Family History Diabetes 1st Degree Relative  (ICD-V18.0) 5)  Hypertension  (ICD-401.9)  Current Medications (verified): 1)  None  Allergies (verified): 1)  Asa  Past History:  Past Medical History: Last updated: 10/11/2009 Hypertension Frequent headaches Hyperlipidemia  Review of Systems      See HPI  Physical Exam  General:  Well-developed,well-nourished,in no acute distress; alert,appropriate and cooperative throughout examination Extremities:  right great toe reveals some diffuse swelling. She has some purulent drainage along the border secondary to the second toe. There some redness and slight swelling but nonfluctuant over the dorsum of the toe near the base of the nail.   Impression & Recommendations:  Problem #  1:  CELLULITIS, GREAT TOE (ICD-681.10) ingrown nail with subsequent development of cellulitis great toe.  Start Abs and continue soaks and reassess one week and sooner as needed. The following medications were removed from the medication list:    Amoxicillin 875 Mg Tabs (Amoxicillin) ..... One by mouth two times a day for 10 days Her updated medication list for this problem includes:    Amoxicillin-pot Clavulanate 875-125 Mg Tabs (Amoxicillin-pot clavulanate) ..... One by mouth two times a day for 10 days  Complete Medication List: 1)  Amoxicillin-pot Clavulanate 875-125 Mg Tabs (Amoxicillin-pot clavulanate) .... One by mouth two times a day for 10 days  Patient Instructions: 1)  Elevate toe/foot frequently 2)  Continue warm salt soaks several times daily 3)  Follow up immediately if you develop any systemic fever or worsening redness or swelling Prescriptions: AMOXICILLIN-POT CLAVULANATE 875-125 MG TABS (AMOXICILLIN-POT CLAVULANATE) one by mouth two times a day for 10 days  #20 x 0   Entered and Authorized by:   Evelena Peat MD   Signed by:   Evelena Peat MD on 01/08/2010   Method used:   Electronically to        [id number] (retail)       [address]       [place], [place]  [address]       Ph: [phone] or [phone]       Fax: [phone]   RxID:  [id number]  

## 2010-10-16 NOTE — Assessment & Plan Note (Signed)
Summary: R TOE PAIN (F/U) // RS   Vital Signs:  Patient profile:   35 year old female Menstrual status:  irregular Temp:     98.0 degrees F oral BP sitting:   122 / 88  (left arm) Cuff size:   large  Vitals Entered By: Sid Falcon LPN (Feb 08, 2010 11:01 AM)  History of Present Illness: Patient seen with drainage and swelling right great toe.  initially seen here late April with ingrown nail and drainage. We placed on antibiotics. She subsequently went to podiatrist and had partial excision of nail with lateral borders removed. She's had some persistent drainage and swelling since then. Has been using Silvadene topically without much improvement. Has some pain with ambulation. No fevers or chills. Also placed on her current round of Augmentin per her podiatrist. No history of diabetes.  Allergies: 1)  Asa 2)  Santyl (Collagenase)  Past History:  Past Medical History: Last updated: 10/11/2009 Hypertension Frequent headaches Hyperlipidemia  Review of Systems      See HPI  Physical Exam  General:  Well-developed,well-nourished,in no acute distress; alert,appropriate and cooperative throughout examination Extremities:  right great toe reveals inflammation mostly on the base of the toe near the nail base. She has some purulent drainage with applying pressure near the base of the nail. Mild to moderate tenderness. Minimal erythema.   Impression & Recommendations:  Problem # 1:  INGROWN NAIL (ICD-703.0) She has some persistent cellulitis changes of toe and purulent secretions cultured.  I have expressed my concern that her entire nail might need to be removed to facilitate healing.  Pt prefers to try and avoid that.  Start Keflex pending cx results and f/u 1 week. The following medications were removed from the medication list:    Amoxicillin-pot Clavulanate 875-125 Mg Tabs (Amoxicillin-pot clavulanate) ..... One by mouth two times a day for 10 days Her updated medication list  for this problem includes:    Cephalexin 500 Mg Caps (Cephalexin) ..... One by mouth three times a day for 10 days  Complete Medication List: 1)  Bactroban 2 % Oint (Mupirocin) .... Apply to affected rash two times a day 2)  Cephalexin 500 Mg Caps (Cephalexin) .... One by mouth three times a day for 10 days  Other Orders: T-Culture, Wound ([id number])  Patient Instructions: 1)  continue soaks with Epsom salts twice daily 2)  Schedule followup appointment in one week Prescriptions: CEPHALEXIN 500 MG CAPS (CEPHALEXIN) one by mouth three times a day for 10 days  #30 x 0   Entered and Authorized by:   Evelena Peat MD   Signed by:   Evelena Peat MD on 02/08/2010   Method used:   Electronically to        [id number] (retail)       [address]       [place], [place]  [address]       Ph: [phone] or [phone]       Fax: [phone]   RxID:   [id number] BACTROBAN 2 % OINT (MUPIROCIN) apply to affected rash two times a day  #22 gms x 1   Entered and Authorized by:   Evelena Peat MD   Signed by:   Evelena Peat MD on 02/08/2010   Method used:   Electronically to        [id number] (retail)       [address]       [place], [place]  [address]  Ph: [phone] or [phone]       Fax: [phone]   RxID:   [id number]

## 2010-10-16 NOTE — Letter (Signed)
Summary: [facility]  [facility]   Imported By: Maryln Gottron 02/13/2010 13:27:47  _____________________________________________________________________  External Attachment:    Type:   Image     Comment:   External Document

## 2010-10-16 NOTE — Assessment & Plan Note (Signed)
Summary: SORE THROAT, EAR PAIN // RS   Vital Signs:  Patient profile:   35 year old female Menstrual status:  irregular Temp:     98.4 degrees F oral BP sitting:   120 / 88  (left arm) Cuff size:   large  Vitals Entered By: Sid Falcon LPN (February 14, 453 2:30 PM) CC: sore throat, ear pain X 5 days   History of Present Illness: here for sore throat past few days with some assoc laryngitis and bil ear pain. No fever.  Mild PND.  No cough.  Sore throat not relieved with Tylenol or Alleve.  R great toe infection following partial nail excision by podiatrist.  Pos cx staph aureus.  Pt on Keflex with bactroban and soaks and greatly improved.  Less pain and less drainage.  Allergies: 1)  Asa 2)  Santyl (Collagenase)  Past History:  Past Medical History: Last updated: 10/11/2009 Hypertension Frequent headaches Hyperlipidemia  Review of Systems  The patient denies fever, hoarseness, prolonged cough, and headaches.    Physical Exam  General:  Well-developed,well-nourished,in no acute distress; alert,appropriate and cooperative throughout examination Ears:  External ear exam shows no significant lesions or deformities.  Otoscopic examination reveals clear canals, tympanic membranes are intact bilaterally without bulging, retraction, inflammation or discharge. Hearing is grossly normal bilaterally. Nose:  External nasal examination shows no deformity or inflammation. Nasal mucosa are pink and moist without lesions or exudates. Mouth:  Oral mucosa and oropharynx without lesions or exudates.  Teeth in good repair. Neck:  No deformities, masses, or tenderness noted. Lungs:  Normal respiratory effort, chest expands symmetrically. Lungs are clear to auscultation, no crackles or wheezes. Heart:  Normal rate and regular rhythm. S1 and S2 normal without gallop, murmur, click, rub or other extra sounds. Msk:  R great toe reveals less edema and less drainage.  less tender.   Impression &  Recommendations:  Problem # 1:  SORE THROAT (ICD-462) Assessment New rapid strep neg.  Suspect viral. Vicodin as needed severe pain. Her updated medication list for this problem includes:    Cephalexin 500 Mg Caps (Cephalexin) ..... One by mouth three times a day for 10 days  Orders: Rapid Strep (09811)  Problem # 2:  CELLULITIS, GREAT TOE (ICD-681.10) Assessment: Improved finish out keflex and refill if needed. Her updated medication list for this problem includes:    Cephalexin 500 Mg Caps (Cephalexin) ..... One by mouth three times a day for 10 days  Complete Medication List: 1)  Bactroban 2 % Oint (Mupirocin) .... Apply to affected rash two times a day 2)  Cephalexin 500 Mg Caps (Cephalexin) .... One by mouth three times a day for 10 days 3)  Hydrocodone-acetaminophen 5-325 Mg Tabs (Hydrocodone-acetaminophen) .Marland Kitchen.. 1-2 by mouth q4-6 hours as needed pain.  Patient Instructions: 1)  Continue salt water soaks to foot twice daily. 2)  Get plenty of rest, drink lots of clear liquids, and use Tylenol or Ibuprofen for fever and comfort. Return in 7-10 days if you're not better: sooner if you'er feeling worse.  Prescriptions: HYDROCODONE-ACETAMINOPHEN 5-325 MG TABS (HYDROCODONE-ACETAMINOPHEN) 1-2 by mouth q4-6 hours as needed pain.  #20 x 0   Entered and Authorized by:   Evelena Peat MD   Signed by:   Evelena Peat MD on 02/14/2010   Method used:   Print then Give to Patient   RxID:   [id number] CEPHALEXIN 500 MG CAPS (CEPHALEXIN) one by mouth three times a day for 10 days  #30  x 0   Entered and Authorized by:   Evelena Peat MD   Signed by:   Evelena Peat MD on 02/14/2010   Method used:   Print then Give to Patient   RxID:   [id number]   Laboratory Results    Other Tests  Rapid Strep: negative Comments: Rita Ohara  February 14, 2010 2:34 PM   Kit Test Internal QC: Negative   (Normal Range: Negative)

## 2010-10-18 NOTE — Consult Note (Signed)
Summary: [facility]  [facility]   Imported By: Maryln Gottron 09/12/2010 13:15:28  _____________________________________________________________________  External Attachment:    Type:   Image     Comment:   External Document

## 2010-11-01 ENCOUNTER — Encounter: Payer: Self-pay | Admitting: Family Medicine

## 2010-11-01 ENCOUNTER — Ambulatory Visit (INDEPENDENT_AMBULATORY_CARE_PROVIDER_SITE_OTHER): Payer: 59 | Admitting: Family Medicine

## 2010-11-01 VITALS — BP 130/90 | Temp 98.4°F | Ht 64.0 in | Wt 350.0 lb

## 2010-11-01 DIAGNOSIS — J209 Acute bronchitis, unspecified: Secondary | ICD-10-CM

## 2010-11-01 MED ORDER — AZITHROMYCIN 250 MG PO TABS: ORAL_TABLET | ORAL | Status: AC

## 2010-11-01 MED ORDER — CHLORPHENIRAMINE-PSEUDOEPH 8-120 MG PO CPCR: 1.0000 | ORAL_CAPSULE | Freq: Two times a day (BID) | ORAL | Status: AC

## 2010-11-01 NOTE — Progress Notes (Signed)
  Subjective:    Patient ID: Ashley Henry, female    DOB: [birth year], 35 y.o.   MRN: [id number]  HPI   Patient is seen with one week history of illness. She has frequent sneezing, cough productive of brown sputum, maxillary sinus pressure. Denies sore throat. No chills but possible low-grade fever. She's tried decongestant medication without much improvement. Nonsmoker. No history of asthma.  Review of Systems As per HPI    Objective:   Physical Exam  patient is alert nontoxic appearance  Oropharynx is moist and clear Eardrums are normal Neck is supple no adenopathy Chest clear to auscultation Heart regular rhythm and rate       Assessment & Plan:   acute bronchitis. Continue with decongestant and chlorpheniramine Sudafed combination given. Cautioned about potential side effects. Push plenty of fluids. Start Zithromax if productive cough persist or she develops a fever

## 2010-11-01 NOTE — Patient Instructions (Signed)
Acute Bronchitis You have acute bronchitis. This means you have a chest cold. The airways in your lungs are inflamed (red and sore). Acute means it is sudden onset. Bronchitis is most often caused by a virus. In smokers, people with chronic lung problems, and elderly patients, treatment with antibiotics for bacterial infection may be needed. Exposure to cigarette smoke or irritating chemicals will make bronchitis worse. Allergies and asthma can also make bronchitis worse. Repeated episodes of bronchitis may cause long standing lung problems. Acute bronchitis is usually treated with rest, fluids, and medicines for relief of fever or cough. Bronchodilator medicines from metered inhalers or a nebulizer may be used to help open up the small airways. This reduces shortness of breath and helps control cough. Antibiotics can be prescribed if you are more seriously ill or at risk. A cool air vaporizer may help thin bronchial secretions and make it easier to clear your chest. Increased fluids may also help. You must avoid smoking, even second hand exposure. If you are a cigarette smoker, consider using nicotine gum or skin patches to help control withdrawal symptoms. Recovery from bronchitis is often slow, but you should start feeling better after 2-3 days. Cough from bronchitis frequently lasts for 3-4 weeks.  SEEK IMMEDIATE MEDICAL CARE IF YOU DEVELOP:  Increased fever, chills, or chest pain.   Severe shortness of breath or bloody sputum.   Dehydration, fainting, repeated vomiting, severe headache.   No improvement after one week of proper treatment.  MAKE SURE YOU:   Understand these instructions.   Will watch your condition.   Will get help right away if you are not doing well or get worse.  Document Released: 10/10/2004 Document Re-Released: 08/15/2008 [employer] Patient Information 2011 [employer]. 

## 2010-11-02 ENCOUNTER — Telehealth: Payer: Self-pay | Admitting: Family Medicine

## 2010-11-02 NOTE — Telephone Encounter (Signed)
Med we prescribed is equivalent to prescription.

## 2010-11-02 NOTE — Telephone Encounter (Signed)
Pt call back concerning sinus med. Pt is aware doc has not responded to message yet

## 2010-11-02 NOTE — Telephone Encounter (Signed)
Pt went to [facility] on [address] to pick up sinus med that was prescribed and pharmacist told pt that this was an otc med. Pt is req the Dr Caryl Never prescribed a sinus med that requires a prescription, because pt has been suffering with her sinuses over a weak and needs something stronger.

## 2010-11-05 NOTE — Telephone Encounter (Signed)
patient  Stated that the pharmacist said the medication was over the counter strength.  If she does not feel better by the end of the week she will schedule an office visit. FYI.

## 2010-11-28 ENCOUNTER — Ambulatory Visit (INDEPENDENT_AMBULATORY_CARE_PROVIDER_SITE_OTHER): Payer: 59 | Admitting: Family Medicine

## 2010-11-28 ENCOUNTER — Encounter: Payer: Self-pay | Admitting: Family Medicine

## 2010-11-28 VITALS — BP 130/88 | Temp 100.5°F

## 2010-11-28 DIAGNOSIS — R05 Cough: Secondary | ICD-10-CM

## 2010-11-28 DIAGNOSIS — R059 Cough, unspecified: Secondary | ICD-10-CM

## 2010-11-28 MED ORDER — AZITHROMYCIN 250 MG PO TABS: ORAL_TABLET | ORAL | Status: DC

## 2010-11-28 MED ORDER — HYDROCOD POLST-CHLORPHEN POLST 10-8 MG/5ML PO LQCR: 5.0000 mL | Freq: Two times a day (BID) | ORAL | Status: DC | PRN

## 2010-11-28 NOTE — Progress Notes (Signed)
  Subjective:    Patient ID: Ashley Henry, female    DOB: [birth year], 35 y.o.   MRN: [id number]  HPI  patient seen with acute illness. Onset last Thursday. Sinus drainage and cough. Symptoms have worsened since then with development last night a fever of 101 which is a new finding. Some nausea and vomiting yesterday but none today. No diarrhea. No earache or sore throat. Patient is a nonsmoker. No relief of cough with over-the-counter medications.   Review of Systems  Constitutional: Positive for fever, chills and fatigue.  HENT: Positive for congestion, postnasal drip and sinus pressure. Negative for ear pain, sore throat and neck stiffness.   Respiratory: Positive for cough. Negative for shortness of breath.   Cardiovascular: Negative for chest pain.  Skin: Negative for rash.       Objective:   Physical Exam  patient is alert and coughing off and on during exam. Temperature 100.5 Oropharynx moist and clear Air drums no acute changes Neck supple no adenopathy Chest few scattered rhonchi. No rales. No retractions Heart regular rhythm and rate  Skin exam no rash       Assessment & Plan:   Cough. No clear indicators of pneumonia a but concerning is the fact that she has developed fever several days into illness. Start Zithromax. Tussionex as needed for nighttime cough. Followup as needed

## 2010-11-30 ENCOUNTER — Ambulatory Visit (INDEPENDENT_AMBULATORY_CARE_PROVIDER_SITE_OTHER): Payer: 59 | Admitting: Family Medicine

## 2010-11-30 ENCOUNTER — Encounter: Payer: Self-pay | Admitting: Family Medicine

## 2010-11-30 VITALS — BP 120/90 | Temp 99.5°F

## 2010-11-30 DIAGNOSIS — R509 Fever, unspecified: Secondary | ICD-10-CM

## 2010-11-30 MED ORDER — CEFDINIR 300 MG PO CAPS: 300.0000 mg | ORAL_CAPSULE | Freq: Two times a day (BID) | ORAL | Status: AC

## 2010-11-30 NOTE — Patient Instructions (Signed)
Stop Zithromax.  Start Cefdinir one twice daily. Follow up next week if fever no better.

## 2010-11-30 NOTE — Progress Notes (Signed)
  Subjective:    Patient ID: Ashley Henry, female    DOB: 04/12/1976, 35 y.o.   MRN: [id number]  HPI  Patient seen for followup. Refer to last note. Respiratory illness and started on Zithromax. Has had some nausea and abdominal cramps about one hour after taking medication. Fever up to 102 last night. No vomiting or diarrhea. Cough productive of brown sputum. No dyspnea and no pleuritic pain.   Review of Systems  Constitutional: Positive for fever, chills and fatigue.  HENT: Negative for neck stiffness.   Respiratory: Positive for cough. Negative for shortness of breath and wheezing.   Cardiovascular: Negative for chest pain.  Genitourinary: Negative for dysuria.       Objective:   Physical Exam  patient is alert and nontoxic in appearance. Temperature 99.5 Oropharynx is moist and clear  eardrums normal appearance Neck supple no adenopathy Chest no wheezes rales or rhonchi. Symmetric breath sounds Heart regular rate       Assessment & Plan:   cough with associated fever. Discontinue Zithromax. Start Cefdinir 300 mg twice a day. Touch base on Monday if fever persists at that time consider chest x-ray

## 2010-12-02 ENCOUNTER — Encounter: Payer: Self-pay | Admitting: Family Medicine

## 2011-02-01 NOTE — Op Note (Signed)
NAME:  DEIRDRA, HEUMANN                            ACCOUNT NO.:  [id number]   MEDICAL RECORD NO.:  [id number]                   PATIENT TYPE:  INP   LOCATION:  9125                                 FACILITY:  [facility]   PHYSICIAN:  Juan H. Lily Peer, M.D.             DATE OF BIRTH:  Jun 02, 1976   DATE OF PROCEDURE:  08/05/2003  DATE OF DISCHARGE:                                 OPERATIVE REPORT   PREOPERATIVE DIAGNOSES:  1. Estimated gestational age 33-1/7 weeks.  2. Questionable prolonged rupture of membranes.  3. Oligohydramnios.  4. Request for elective primary cesarean section.  5. Narrow pelvis.   POSTOPERATIVE DIAGNOSES:  1. Estimated gestational age 33-1/7 weeks.  2. Questionable prolonged rupture of membranes.  3. Oligohydramnios.  4. Request for elective primary cesarean section.  5. Narrow pelvis.   ANESTHESIA:  Epidural.   PROCEDURE PERFORMED:  Primary lower uterine segment transverse cesarean  section.   SURGEON:  Juan H. Lily Peer, M.D.   INDICATION FOR OPERATION:  A 89 year old gravida 2, para 0, AB 1, at 40-1/7  weeks' gestation with question of prolonged rupture of membranes.  The  patient stated she had been leaking fluid since Tuesday.  Nitrazine was  negative in the emergency room.  Ultrasound had demonstrated evidence of  oligohydramnios and an estimated fetal weight of approximately 7 pounds.  Patient with a narrow pelvis.  She had opted to proceed with an elective  primary cesarean section when offered induction.  Reassuring fetal heart  rate tracing and was afebrile and was started on Unasyn 3 g IV.   FINDINGS:  Clear amniotic fluid.  A viable female infant, Apgars of 8 and 9,  with a weight of 6 pounds 10 ounces.  Arterial cord pH of 7.26 and normal  maternal pelvic anatomy.   DESCRIPTION OF OPERATION:  After the patient was adequately counseled, she  was taken to the operating room where she underwent a successful spinal  anesthesia.  Her abdomen was  prepped and draped in the usual sterile  fashion.  A Foley catheter had been inserted in an effort to monitor urine  output.  A Pfannenstiel skin incision was made 2 cm above the symphysis  pubis.  The incision was carried down through the skin and subcutaneous  tissue down to the rectus fascia, whereby a midline nick was made, the  fascia was incised in a transverse fashion.  The peritoneal cavity was  entered cautiously.  The bladder flap was established.  The lower uterine  segment was incised in a transverse fashion.  The newborn was delivered  immediately after having to penetrate through the placenta since there was  an anterior placenta present.  The newborn's nasopharyngeal area was bulb-  suctioned.  The newborn was delivered completely.  The cord was doubly  clamped and excised.  The baby gave an immediate cry, was shown off to the  parents and passed  off to the pediatricians who were in attendance, who gave  the above-mentioned parameters.  After cord blood was obtained, the placenta  was delivered from the intrauterine cavity and submitted for histologic  evaluation.  The uterus was then exteriorized, a Pitocin drip was started.  The patient had received 3 g of Unasyn prior to commencement of the cesarean  section.  The uterine cavity was swept clear of remaining products of  conception.  The uterus was closed in a single locking stitch manner with 0  Vicryl suture.  The uterus was placed back in the pelvic cavity.  The pelvic  cavity was copiously irrigated with normal saline solution.  After  ascertaining hemostasis and sponge count and needle count was correct,  closure was commenced.  The visceral peritoneum was not closed.  The rectus  fascia was closed with a 0 Vicryl suture.  The subcutaneous bleeders were  Bovie cauterized.  The skin was reapproximated by skin clips, followed by  placement of Xeroform gauze and 4 x 8 dressing.  The patient was transferred  to the  recovery room with stable vital signs.  Blood loss from the procedure  was 800 mL and IV fluids consisted of 2000 mL of lactated Ringer's, and  urine output was 200 mL and clear.                                               Juan H. Lily Peer, M.D.    JHF/MEDQ  D:  08/05/2003  T:  08/06/2003  Job:  [id number]   cc:   Fayrene Fearing A. Ashley Royalty, M.D.  [address]  [place], [place] [address]  Fax: [phone]

## 2011-02-01 NOTE — H&P (Signed)
NAME:  Ashley Henry, Ashley Henry                            ACCOUNT NO.:  [id number]   MEDICAL RECORD NO.:  [id number]                   PATIENT TYPE:  INP   LOCATION:  9198                                 [facility]ILITY:  [facility]   PHYSICIAN:  Juan H. Lily Peer, M.D.             DATE OF BIRTH:  Apr 21, 1976   DATE OF ADMISSION:  08/05/2003  DATE OF DISCHARGE:                                HISTORY & PHYSICAL   CHIEF COMPLAINT:  1. Rupture of membranes.  2. Estimated gestational age of 40-1/2 weeks.  3. Oligohydramnios.  4. Request for elective cesarean section.   HISTORY:  The patient is a 35 year old gravida 2, para 0, AB 1, with an  estimated date of confinement of August 04, 2003, the patient currently 51-  1/7 weeks' gestation.  Had been sent to [facility] for an ultrasound  per Fayrene Fearing A. Ashley Royalty, M.D., for estimated fetal weight and it was found at  that time that the estimated fetal weight was 3170 g in the 25th-50th  percentile but low AFI of 5.3 cm, less than the 5th percentile for 40 weeks'  gestation.  Anterior grade 2 placenta and on further questioning, the  patient stated that she has been leaking fluid on and off since Tuesday. On  examination in maternity admissions today Nitrazine was negative but before  we were starting to proceed with doing a fern test, she had stated that she  had talked to Dr. Ashley Royalty and that next week they were contemplating  inducing her if the baby was not large, if the baby had an average estimated  fetal weight, but that he was concerned because of her narrow pelvis, of  which I concur by my examination.  Her cervix was long and closed and I have  given her the option of bringing her into the hospital now and plan on  inducing her, and she had opted to proceed with a primary elective cesarean  section, for which the risks, benefits and pros and cons were discussed.  While waiting for a room in the operating room suite, she will be started on  penicillin  G 5,000,000 units IV.   A review of her prenatal history indicated that she has sickle cell trait  and that she has suffered from anemia during her pregnancy.  She was on iron  supplementation.  She had also seen a chiropractor for low back pains, and  he took her off of work and was doing manipulative therapy to her back.  Otherwise benign prenatal course with the exception of sometimes labile  elevated blood pressures.   PAST MEDICAL HISTORY:  She had a spontaneous AB in 1995.  She denies any  allergies.  She had a breast reduction in 1999.   FAMILY HISTORY:  See [facility] form and additional information on the  [facility] form as well.   PHYSICAL EXAMINATION:  VITAL SIGNS:  Blood pressure 158/82,  temperature  99.8, respirations 20, pulse 99.  HEENT:  Unremarkable.  NECK:  Supple, trachea midline.  No carotid bruits.  No thyromegaly.  CHEST:  Lungs clear to auscultation without rhonchi or wheezes.  CARDIAC:  Regular rate and rhythm, no murmurs or gallops.  BREASTS:  Exam not done.  ABDOMEN:  Gravid uterus, fundal height approximately 40+ week centimeters.  Vertex presentation by [facility] maneuver, confirmed by ultrasound today.  PELVIC:  Cervix long, closed, posterior, ballottable presentation.  EXTREMITIES:  Trace edema, DTR 1+, negative clonus.   PRENATAL LABORATORY DATA:  A positive blood type, negative antibody screen.  Sickle cell trait positive.  VDRL was nonreactive.  Hepatitis B surface  antigen was negative.  Rubella titer immune.  HIV result not in chart.  Pap  smear was normal.  GC and Chlamydia cultures have been normal.  Alpha-  fetoprotein has been reported to be normal.  GBS culture was positive.  Ultrasound today estimated fetal weight 3170 g in the 25th-50th percentile,  AFI 5.3, in approximately the 5th percentile for 40 weeks' gestation.  Grade  2 placenta, anterior, cephalic presentation.   ASSESSMENT:  A 35 year old gravida 2, para 0, AB 1, at 40-1/7 weeks'   gestation with apparent prolonged rupture of membranes for the past three to  four days.  Fern negative on examination but ultrasound demonstrating  oligohydramnios.  Patient with very narrow pelvis, had been given the option  to proceed with induction and she had been in communication with her primary  physician and stated that more than likely she would end up with a cesarean  section but if the ultrasound did not show any evidence of fetal macrosomia  it was worth a trial for a vaginal delivery.  She has electively requested  to proceed with a primary cesarean section.  The risks, benefits, and pros  and cons were discussed, including infection, bleeding, trauma to internal  organs, trauma to the fetus, the risk for deep venous thrombosis and  pulmonary embolism, the risk for hemorrhage, and risk for blood transfusion  and its potential risks, such as anaphylactic reaction, hepatitis, and AIDS.  All these issues were discussed with the patient and her husband and they  would like to proceed with an elective cesarean section.  Due to the fact  that she is GBS-positive, as we wait for a room to be available in the  operating room suite, she will be given 5,000,000 units of penicillin G  intravenously.   PLAN:  As per assessment above.                                               Juan H. Lily Peer, M.D.    JHF/MEDQ  D:  08/05/2003  T:  08/05/2003  Job:  [id number]   cc:   Fayrene Fearing A. Ashley Royalty, M.D.  [address]  [place], [place] [address]  Fax: [phone]

## 2011-02-01 NOTE — Discharge Summary (Signed)
NAME:  Ashley Henry, HELDER                            ACCOUNT NO.:  [id number]   MEDICAL RECORD NO.:  [id number]                   PATIENT TYPE:  INP   LOCATION:  9125                                 [facility]ILITY:  [facility]   PHYSICIAN:  Rudy Jew. Ashley Royalty, M.D.             DATE OF BIRTH:  [birth year]   DATE OF ADMISSION:  08/05/2003  DATE OF DISCHARGE:  08/08/2003                                 DISCHARGE SUMMARY   ADMISSION DIAGNOSES:  1. Rupture of membranes.  2. Gestational age of 40-1/2 weeks.  3. Oligohydramnios.  4. Request for elective cesarean section.   HOSPITAL COURSE:  For history and physical, please review the chart. On  August 05, 2003, a primary cesarean section was performed with delivery of  a viable infant female with Apgars of 8 and 9, weight was 6 pounds 10  ounces. The patient tolerated surgery well. The patient was in satisfactory  condition and was taken to the recovery room. On August 06, 2003, bowel  sounds were stable, the patient was afebrile, incision was dry and intact,  fundus was U-2 and firm. The patient was considered stable. On August 08, 2003, the patient requested discharge home. At this time she was in stable  condition and vital signs were stable. Incision was clean, dry, and intact.  Staples were removed and Steri-Strips were placed. A prescription for  Percocet, Motrin, and Chromagen was given with instructions for the patient  to return to the office in four to six weeks for her postpartum care.     Velora Mediate, NP                           Rudy Jew. Ashley Royalty, M.D.    TC/MEDQ  D:  09/26/2003  T:  09/26/2003  Job:  [id number]

## 2011-03-26 ENCOUNTER — Emergency Department (HOSPITAL_BASED_OUTPATIENT_CLINIC_OR_DEPARTMENT_OTHER)
Admission: EM | Admit: 2011-03-26 | Discharge: 2011-03-26 | Disposition: A | Discharge status: Home / Self Care | Payer: 59 | Attending: Emergency Medicine | Admitting: Emergency Medicine

## 2011-03-26 ENCOUNTER — Encounter (HOSPITAL_BASED_OUTPATIENT_CLINIC_OR_DEPARTMENT_OTHER): Payer: Self-pay | Admitting: *Deleted

## 2011-03-26 DIAGNOSIS — B3731 Acute candidiasis of vulva and vagina: Secondary | ICD-10-CM | POA: Insufficient documentation

## 2011-03-26 DIAGNOSIS — I10 Essential (primary) hypertension: Secondary | ICD-10-CM | POA: Insufficient documentation

## 2011-03-26 DIAGNOSIS — E785 Hyperlipidemia, unspecified: Secondary | ICD-10-CM | POA: Insufficient documentation

## 2011-03-26 DIAGNOSIS — B373 Candidiasis of vulva and vagina: Secondary | ICD-10-CM

## 2011-03-26 DIAGNOSIS — N898 Other specified noninflammatory disorders of vagina: Secondary | ICD-10-CM | POA: Insufficient documentation

## 2011-03-26 LAB — URINALYSIS, ROUTINE W REFLEX MICROSCOPIC
Bilirubin Urine: NEGATIVE
Glucose, UA: NEGATIVE mg/dL
Ketones, ur: NEGATIVE mg/dL
Nitrite: NEGATIVE
Protein, ur: NEGATIVE mg/dL
Specific Gravity, Urine: 1.025 (ref 1.005–1.030)
Urobilinogen, UA: 1 mg/dL (ref 0.0–1.0)
pH: 6.5 (ref 5.0–8.0)

## 2011-03-26 LAB — URINE MICROSCOPIC-ADD ON

## 2011-03-26 LAB — WET PREP, GENITAL: Trich, Wet Prep: NONE SEEN

## 2011-03-26 LAB — PREGNANCY, URINE: Preg Test, Ur: NEGATIVE

## 2011-03-26 MED ORDER — FLUCONAZOLE 50 MG PO TABS
ORAL_TABLET | ORAL | Status: AC
  Filled 2011-03-26: qty 1

## 2011-03-26 MED ORDER — FLUCONAZOLE 150 MG PO TABS
150.0000 mg | ORAL_TABLET | Freq: Once | ORAL | Status: AC
  Administered 2011-03-26: 150 mg via ORAL

## 2011-03-26 MED ORDER — FLUCONAZOLE 100 MG PO TABS
ORAL_TABLET | ORAL | Status: AC
  Administered 2011-03-26: 150 mg via ORAL
  Filled 2011-03-26: qty 1

## 2011-03-26 NOTE — ED Provider Notes (Signed)
History     Chief Complaint  Patient presents with  . Abdominal Pain   Patient is a 35 y.o. female presenting with vaginal discharge.  Vaginal Discharge This is a new problem. Episode onset: 2 weeks ago  The problem occurs constantly. The problem has been gradually worsening. Associated symptoms include abdominal pain. Associated symptoms comments: Vaginal itching but no dysuria.  New sexual partner over the last 2 months but always uses protection.. The symptoms are aggravated by nothing. The symptoms are relieved by nothing. Treatments tried: tried OTC itch creams but did not help. The treatment provided no relief.    Past Medical History  Diagnosis Date  . HYPERTENSION 09/07/2009  . Headache 08/03/2010  . HYPERLIPIDEMIA 10/11/2009    Past Surgical History  Procedure Date  . Cesarean section 2004  . Breast surgery 1999    No family history on file.  History  Substance Use Topics  . Smoking status: Never Smoker   . Smokeless tobacco: Never Used  . Alcohol Use: No    OB History    Grav Para Term Preterm Abortions TAB SAB Ect Mult Living                  Review of Systems  Gastrointestinal: Positive for abdominal pain.  Genitourinary: Positive for decreased urine volume, vaginal discharge and pelvic pain. Negative for urgency, vaginal bleeding and menstrual problem.  All other systems reviewed and are negative.    Physical Exam  BP 167/80  Pulse 92  Temp(Src) 98.6 F (37 C) (Oral)  Resp 22  SpO2 100%  LMP 02/24/2011  Physical Exam  Constitutional: She appears well-developed and well-nourished. No distress.  HENT:  Head: Normocephalic and atraumatic.  Eyes: EOM are normal. Pupils are equal, round, and reactive to light.  Cardiovascular: Normal rate, regular rhythm and normal heart sounds.   Pulmonary/Chest: Effort normal and breath sounds normal. No respiratory distress. She has no wheezes. She has no rales.  Abdominal: Soft. There is tenderness in the  suprapubic area. There is no rebound and no guarding.  Genitourinary: Cervix exhibits discharge and friability. Cervix exhibits no motion tenderness. Right adnexum displays no tenderness. Left adnexum displays no tenderness. Vaginal discharge found.       Curd like discharge in the entire vaginal vault and over the cervix    ED Course  Procedures  MDM Pt with vaginal itching, discharge adn abd pain worsening for the last 2 weeks.  Denies dysuria or systemic sx.  States always uses protection and on exam most consistent with yeast infection.  Wet prep/GC/chlamydia sent.  UA/UPT pending.  Wet prep showed yeast as suspected.  Will d/c home.   Gwyneth Sprout, MD 03/26/11 2132

## 2011-03-26 NOTE — ED Notes (Signed)
Lower abd. today.  Vaginal itching and burning x 2 weeks.

## 2011-03-28 LAB — GC/CHLAMYDIA PROBE AMP, GENITAL
Chlamydia, DNA Probe: NEGATIVE
GC Probe Amp, Genital: NEGATIVE

## 2011-05-07 ENCOUNTER — Other Ambulatory Visit (HOSPITAL_COMMUNITY)
Admission: RE | Admit: 2011-05-07 | Discharge: 2011-05-07 | Disposition: A | Discharge status: Home / Self Care | Payer: 59 | Source: Ambulatory Visit | Attending: Gynecology | Admitting: Gynecology

## 2011-05-07 ENCOUNTER — Telehealth: Payer: Self-pay | Admitting: *Deleted

## 2011-05-07 ENCOUNTER — Encounter: Payer: Self-pay | Admitting: Gynecology

## 2011-05-07 ENCOUNTER — Ambulatory Visit (INDEPENDENT_AMBULATORY_CARE_PROVIDER_SITE_OTHER): Payer: 59 | Admitting: Gynecology

## 2011-05-07 VITALS — BP 118/78 | Ht 64.0 in | Wt 258.0 lb

## 2011-05-07 DIAGNOSIS — N852 Hypertrophy of uterus: Secondary | ICD-10-CM

## 2011-05-07 DIAGNOSIS — Z01419 Encounter for gynecological examination (general) (routine) without abnormal findings: Secondary | ICD-10-CM

## 2011-05-07 DIAGNOSIS — N898 Other specified noninflammatory disorders of vagina: Secondary | ICD-10-CM

## 2011-05-07 DIAGNOSIS — Z3009 Encounter for other general counseling and advice on contraception: Secondary | ICD-10-CM

## 2011-05-07 DIAGNOSIS — B379 Candidiasis, unspecified: Secondary | ICD-10-CM

## 2011-05-07 DIAGNOSIS — Z30431 Encounter for routine checking of intrauterine contraceptive device: Secondary | ICD-10-CM

## 2011-05-07 NOTE — Telephone Encounter (Signed)
Message copied by Libby Maw on Tue May 07, 2011  3:42 PM ------      Message from: Ok Edwards      Created: Tue May 07, 2011  2:51 PM       Loa Idler, please check insurance coverage/verification for [employer] T380A IUD for this patient. Also would like to schedule the same day before insertion of the IUD an ultrasound. Reason for ultrasound enlarged uterus, vaginismus, overweight and complete exam. Patient expecting call to schedule the above. Thank you

## 2011-05-07 NOTE — Progress Notes (Signed)
Ashley Henry 03/06/1976 [id number]   History:    35 y.o.  for annual exam with questions on contraception. She is a gravida 2 para 1 AB 1 (one spontaneous AB) 1 prior cesarean section. Patient had been receiving her gynecological care at another facility does state that her last gynecological examination was normal in 2011. She states she does her monthly self breast examination. She is using condoms for contraception but also Plan B emergency contraception several times a week after intercourse because of fears of conceiving. Patient otherwise his been having normal menstrual cycles and is overweight with a BMI with a BMI of 44.29. Her primary physician is Dr.Burchette for which she is scheduled to see next month and labs will be drawn at that time. Patient has had trouble with different oral contraceptive pills in the past as well as a Depo-Provera shot which caused to get nauseated.  Past medical history,surgical history, family history and social history were all reviewed and documented in the EPIC chart. ROS:  Was performed and pertinent positives and negatives are included in the history.  Exam: A 44.29 chaperone present Filed Vitals:   05/07/11 1403  BP: 118/78   Body mass index is 44.29 kg/(m^2). Patient been receiving her gynecological care General appearance : Well developed well nourished female. Skin grossly normal HEENT: Neck supple, trachea midline Lungs: Clear to auscultation, no rhonchi or wheezes Heart: Regular rate and rhythm, no murmurs or gallops Breast:Examined in sitting and supine position were symmetrical in appearance, no palpable masses, to skin retraction, no nipple inversion, no nipple discharge and no axillary or supraclavicular lymphadenopathy Abdomen: no palpable masses or tenderness Pelvic  Ext/BUS/vagina  normal   Cervix  normal   Uterus  limited due to patient's abdominal girth,  Adnexa limited due to patient's abdominal girth Anus and perineum  normal    Rectovaginal  normal sphincter tone without palpated masses or tenderness             Hemoccult not done     Assessment/Plan:  35 y.o. female for annual exam we discussed different contraceptive options such as injectable contraception subdermal implants intravaginal rings as well as IUDs and permanent laparoscopic sterilization. She has decided to proceed with a nonhormonal IUD Para[employer]ard T380A we did do a wet prep today which demonstrated moniliasis and a prescription for Diflucan 150 mg was prescribed. Some menstrual blood was still present and Pap smear was done. Due to the limited pelvic examination due to patient's abdominal girth and a questionable fibroid uterus we'll have the patient return to the office next week for an ultrasound for better assessment of the pelvis and to insert the IUD at the same time. Literature information was provided risks benefits and pros and cons were discussed all questions were answered and we'll follow accordingly.    Ok Edwards MD, 2:53 PM 05/07/2011

## 2011-05-07 NOTE — Progress Notes (Signed)
Addended byCammie Mcgee T on: 05/07/2011 03:18 PM   Modules accepted: Orders

## 2011-05-07 NOTE — Telephone Encounter (Signed)
Called patient with benefits on IUD and will proceed. $20 copay. Appointments to set up u/s prior to insert.

## 2011-05-07 NOTE — Patient Instructions (Signed)
Please call Ashley Henry at  [phone] to check insurance coverage for [employer] IUD and to schedule you to come in to insert. I have e-prescribed to your pharmacy Diflucam to take one tablet today for a mild yeast infection detected today.

## 2011-05-08 ENCOUNTER — Telehealth: Payer: Self-pay | Admitting: *Deleted

## 2011-05-08 DIAGNOSIS — B373 Candidiasis of vulva and vagina: Secondary | ICD-10-CM

## 2011-05-08 MED ORDER — FLUCONAZOLE 150 MG PO TABS: 150.0000 mg | ORAL_TABLET | Freq: Once | ORAL | Status: AC

## 2011-05-08 NOTE — Telephone Encounter (Signed)
PT CALLED STATING THAT PHARMACY NEVER GOT THE RX FOR DIFLUCAN 150MG . RX SENT TO PHARMACY PER OFFICE VISIT NOTE 05/07/11.

## 2011-05-13 ENCOUNTER — Other Ambulatory Visit: Payer: 59

## 2011-05-13 ENCOUNTER — Ambulatory Visit (INDEPENDENT_AMBULATORY_CARE_PROVIDER_SITE_OTHER): Payer: 59 | Admitting: Gynecology

## 2011-05-13 DIAGNOSIS — L919 Hypertrophic disorder of the skin, unspecified: Secondary | ICD-10-CM

## 2011-05-13 DIAGNOSIS — N852 Hypertrophy of uterus: Secondary | ICD-10-CM

## 2011-05-13 DIAGNOSIS — E669 Obesity, unspecified: Secondary | ICD-10-CM

## 2011-05-13 DIAGNOSIS — L909 Atrophic disorder of skin, unspecified: Secondary | ICD-10-CM

## 2011-05-13 DIAGNOSIS — Z309 Encounter for contraceptive management, unspecified: Secondary | ICD-10-CM

## 2011-05-13 HISTORY — DX: Hypertrophy of uterus: N85.2

## 2011-05-13 NOTE — Progress Notes (Signed)
Patient is a 35 year old who was seen in the office for her annual gynecological examination on August 21. She stated that she was using condoms and Plan B. We had discussed different contraception options and she is here to discuss those options as well as an ultrasound that had been ordered due to the fact that she is morbidly obese with a BMI of 44.29. Her recent Pap smear was normal her GC and Chlamydia culture were negative her wet prep demonstrated moniliasis for which she is taking Diflucan 150 mg.  The ultrasound demonstrated a uterus that measured 8.9 x 6.8 x 4.7 cm with an endometrial stripe of 3.6 mm left ovary was normal with several follicles right ovary was normal with several follicles of all her was normal pelvic ultrasound.  Patient stated that she wanted to go on Ortho Tri-Cyclen low 28 day oral contraceptive pill. The risks benefits and pros and cons were discussed in detail. She denies any family history of any bleeding or clotting disorders or in herself personally. She will be prescribed Ortho Tri-Cyclen Lo 28 day oral contraceptive pill today to start. She was encouraged to do her monthly self breast examination. To exercise regularly and eat a well-balanced meals. She will followup with her primary physician next week for lab work. We will see her back in one year or when necessary.

## 2011-07-31 ENCOUNTER — Emergency Department (HOSPITAL_BASED_OUTPATIENT_CLINIC_OR_DEPARTMENT_OTHER)
Admission: EM | Admit: 2011-07-31 | Discharge: 2011-07-31 | Disposition: A | Discharge status: Home / Self Care | Payer: 59 | Attending: Emergency Medicine | Admitting: Emergency Medicine

## 2011-07-31 ENCOUNTER — Encounter (HOSPITAL_BASED_OUTPATIENT_CLINIC_OR_DEPARTMENT_OTHER): Payer: Self-pay

## 2011-07-31 DIAGNOSIS — J40 Bronchitis, not specified as acute or chronic: Secondary | ICD-10-CM | POA: Insufficient documentation

## 2011-07-31 DIAGNOSIS — J069 Acute upper respiratory infection, unspecified: Secondary | ICD-10-CM | POA: Insufficient documentation

## 2011-07-31 DIAGNOSIS — I10 Essential (primary) hypertension: Secondary | ICD-10-CM | POA: Insufficient documentation

## 2011-07-31 DIAGNOSIS — E785 Hyperlipidemia, unspecified: Secondary | ICD-10-CM | POA: Insufficient documentation

## 2011-07-31 DIAGNOSIS — Z79899 Other long term (current) drug therapy: Secondary | ICD-10-CM | POA: Insufficient documentation

## 2011-07-31 MED ORDER — HYDROCOD POLST-CHLORPHEN POLST 10-8 MG/5ML PO LQCR: 5.0000 mL | Freq: Every evening | ORAL | Status: DC | PRN

## 2011-07-31 MED ORDER — CEPHALEXIN 500 MG PO CAPS: 500.0000 mg | ORAL_CAPSULE | Freq: Four times a day (QID) | ORAL | Status: AC

## 2011-07-31 MED ORDER — CEPHALEXIN 250 MG PO CAPS: 500.0000 mg | ORAL_CAPSULE | Freq: Once | ORAL | Status: DC

## 2011-07-31 NOTE — ED Notes (Signed)
Pt reports cough, fever and chest congestion unrelieved after taking Benadryl and Robitussin.  Onset 2 weeks ago.

## 2011-08-01 NOTE — ED Provider Notes (Signed)
History     CSN: [id number] Arrival date & time: 07/31/2011 11:16 AM   First MD Initiated Contact with Patient 07/31/11 1123      Chief Complaint  Patient presents with  . URI  . Cough    (Consider location/radiation/quality/duration/timing/severity/associated sxs/prior treatment) HPI The patient is a 35 yo F who presents with 14 days of cough and congestion.  She gets this every fall and normally sees her PCP who gives her an antibiotic and cough medicine.  They are out of the office so she came here.  Pain is only moderate and associated with cough which is productive of yellow sputum.  Patient has no fevers.  She has no health problems and no other associated or modifying factors. Past Medical History  Diagnosis Date  . HYPERTENSION 09/07/2009  . Headache 08/03/2010  . HYPERLIPIDEMIA 10/11/2009    Past Surgical History  Procedure Date  . Cesarean section 2004  . Breast surgery 1999    BREAST REDUCTION    Family History  Problem Relation Age of Onset  . Hypertension Mother   . Diabetes Mother   . Hypertension Father   . Cancer Maternal Grandmother     LUNG  . Cancer Paternal Grandmother     STOMACH    History  Substance Use Topics  . Smoking status: Never Smoker   . Smokeless tobacco: Never Used  . Alcohol Use: No    OB History    Grav Para Term Preterm Abortions TAB SAB Ect Mult Living   2 1   1  1   1       Review of Systems  Constitutional: Negative.   HENT: Positive for congestion and sore throat.   Eyes: Negative.   Respiratory: Positive for cough.   Cardiovascular: Negative.   Gastrointestinal: Negative.   Genitourinary: Negative.   Musculoskeletal: Negative.   Skin: Negative.   Neurological: Negative.   Hematological: Negative.   Psychiatric/Behavioral: Negative.   All other systems reviewed and are negative.    Allergies  Aspirin and Collagenase  Home Medications   Current Outpatient Rx  Name Route Sig Dispense Refill  .  GUAIFENESIN 100 MG/5ML PO LIQD Oral Take 200 mg by mouth 3 (three) times daily as needed. For cough     . ORTHO TRI-CYCLEN LO PO Oral Take 1 tablet by mouth daily.      . CEPHALEXIN 500 MG PO CAPS Oral Take 1 capsule (500 mg total) by mouth 4 (four) times daily. 40 capsule 0  . HYDROCOD POLST-CHLORPHEN POLST 10-8 MG/5ML PO LQCR Oral Take 5 mLs by mouth at bedtime as needed. 140 mL 0  . HYDROCOD POLST-CHLORPHEN POLST 10-8 MG/5ML PO LQCR Oral Take 5 mLs by mouth every 12 (twelve) hours as needed. 120 mL 0  . CYCLOBENZAPRINE HCL 5 MG PO TABS Oral Take 5 mg by mouth 3 (three) times daily as needed. For muscle spasm     . MUPIROCIN 2 % EX OINT Topical Apply topically 2 (two) times daily.      Marland Kitchen NAPROXEN 500 MG PO TABS Oral Take 500 mg by mouth 2 (two) times daily with meals. As needed for headache and back pain     . PSEUDOEPHEDRINE-METHSCOPOLAMIN 120-2.5 MG PO TB12 Oral Take by mouth 2 (two) times daily.       BP 124/84  Pulse 89  Temp(Src) 98.5 F (36.9 C) (Oral)  Resp 16  SpO2 99%  LMP 06/30/2011  Physical Exam  Nursing note and vitals reviewed.  Constitutional: She is oriented to person, place, and time. She appears well-developed and well-nourished. No distress.  HENT:  Head: Normocephalic and atraumatic.  Nose: Mucosal edema present.  Mouth/Throat: Posterior oropharyngeal edema and posterior oropharyngeal erythema present. No oropharyngeal exudate.  Eyes: Conjunctivae and EOM are normal. Pupils are equal, round, and reactive to light.  Neck: Normal range of motion.  Cardiovascular: Normal rate, regular rhythm, normal heart sounds and intact distal pulses.  Exam reveals no gallop and no friction rub.   No murmur heard. Pulmonary/Chest: Effort normal and breath sounds normal. She has no wheezes. She has no rales.  Abdominal: Soft. Bowel sounds are normal. She exhibits no distension. There is no tenderness. There is no rebound.  Neurological: She is alert and oriented to person, place,  and time. No cranial nerve deficit. She exhibits normal muscle tone. Coordination normal.  Skin: Skin is warm and dry. No rash noted.  Psychiatric: She has a normal mood and affect.    ED Course  Procedures (including critical care time)  Labs Reviewed - No data to display No results found.   1. Bronchitis       MDM  Patient was evaluated by myself and was hemodynamically stable.  She has had symptoms for some time.  No CXR or other studies were warranted based on vitals and exam.  Patient and I discussed that the way her PCP normally manages her symptoms.  Given patient's history I was comfortable prescribing keflex for this as she has had this previously.  She was also given tussionex prescription.  Patient was discharged home in good condition and can follow-up with her regular doctor.  Patient was discharged in good condition.        Cyndra Numbers, MD 08/01/11 2101

## 2011-09-08 ENCOUNTER — Encounter (HOSPITAL_BASED_OUTPATIENT_CLINIC_OR_DEPARTMENT_OTHER): Payer: Self-pay | Admitting: *Deleted

## 2011-09-08 DIAGNOSIS — I10 Essential (primary) hypertension: Secondary | ICD-10-CM | POA: Insufficient documentation

## 2011-09-08 DIAGNOSIS — B3731 Acute candidiasis of vulva and vagina: Secondary | ICD-10-CM | POA: Insufficient documentation

## 2011-09-08 DIAGNOSIS — B373 Candidiasis of vulva and vagina: Secondary | ICD-10-CM | POA: Insufficient documentation

## 2011-09-08 DIAGNOSIS — E785 Hyperlipidemia, unspecified: Secondary | ICD-10-CM | POA: Insufficient documentation

## 2011-09-08 NOTE — ED Notes (Signed)
Pt states she has had vaginal burning and pain for 2 weeks. Tonight had "clr, slimy discharge"

## 2011-09-09 ENCOUNTER — Encounter (HOSPITAL_BASED_OUTPATIENT_CLINIC_OR_DEPARTMENT_OTHER): Payer: Self-pay

## 2011-09-09 ENCOUNTER — Emergency Department (HOSPITAL_BASED_OUTPATIENT_CLINIC_OR_DEPARTMENT_OTHER)
Admission: EM | Admit: 2011-09-09 | Discharge: 2011-09-09 | Disposition: A | Discharge status: Home / Self Care | Payer: 59 | Attending: Emergency Medicine | Admitting: Emergency Medicine

## 2011-09-09 DIAGNOSIS — B373 Candidiasis of vulva and vagina: Secondary | ICD-10-CM

## 2011-09-09 LAB — URINALYSIS, ROUTINE W REFLEX MICROSCOPIC
Bilirubin Urine: NEGATIVE
Glucose, UA: NEGATIVE mg/dL
Hgb urine dipstick: NEGATIVE
Ketones, ur: NEGATIVE mg/dL
Leukocytes, UA: NEGATIVE
Nitrite: NEGATIVE
Protein, ur: NEGATIVE mg/dL
Specific Gravity, Urine: 1.027 (ref 1.005–1.030)
Urobilinogen, UA: 1 mg/dL (ref 0.0–1.0)
pH: 6.5 (ref 5.0–8.0)

## 2011-09-09 LAB — WET PREP, GENITAL
Clue Cells Wet Prep HPF POC: NONE SEEN
Trich, Wet Prep: NONE SEEN
Yeast Wet Prep HPF POC: NONE SEEN

## 2011-09-09 LAB — PREGNANCY, URINE: Preg Test, Ur: NEGATIVE

## 2011-09-09 MED ORDER — METRONIDAZOLE 500 MG PO TABS: 500.0000 mg | ORAL_TABLET | Freq: Two times a day (BID) | ORAL | Status: AC

## 2011-09-09 MED ORDER — FLUCONAZOLE 150 MG PO TABS: 150.0000 mg | ORAL_TABLET | Freq: Once | ORAL | Status: AC

## 2011-09-09 NOTE — ED Provider Notes (Addendum)
History     CSN: [id number]  Arrival date & time 09/08/11  2330   First MD Initiated Contact with Patient 09/09/11 [phone]      Chief Complaint  Patient presents with  . Vaginal Discharge    (Consider location/radiation/quality/duration/timing/severity/associated sxs/prior treatment) HPI Comments: Patient complains of 2 weeks of itchy vaginal discharge.  She notes that is been a white discharge.  She's not sexually active even though she is married.  Patient denies any fevers, abdominal pain, nausea or vomiting.  No dysuria.  Patient's last menstrual period was normal for her the last month.  Patient states she did try an over-the-counter Monistat but feels that the discharge is actually worse and not improved. and that it is a more slimy discharge now.  Patient is a 35 y.o. female presenting with vaginal discharge. The history is provided by the patient. No language interpreter was used.  Vaginal Discharge This is a new problem. The current episode started more than 1 week ago. The problem occurs constantly. The problem has been gradually worsening. Pertinent negatives include no chest pain, no abdominal pain, no headaches and no shortness of breath.    Past Medical History  Diagnosis Date  . HYPERTENSION 09/07/2009  . Headache 08/03/2010  . HYPERLIPIDEMIA 10/11/2009    Past Surgical History  Procedure Date  . Cesarean section 2004  . Breast surgery 1999    BREAST REDUCTION    Family History  Problem Relation Age of Onset  . Hypertension Mother   . Diabetes Mother   . Hypertension Father   . Cancer Maternal Grandmother     LUNG  . Cancer Paternal Grandmother     STOMACH    History  Substance Use Topics  . Smoking status: Never Smoker   . Smokeless tobacco: Never Used  . Alcohol Use: No    OB History    Grav Para Term Preterm Abortions TAB SAB Ect Mult Living   2 1   1  1   1       Review of Systems  Constitutional: Negative.  Negative for fever and chills.    HENT: Negative.   Eyes: Negative.  Negative for discharge and redness.  Respiratory: Negative.  Negative for cough and shortness of breath.   Cardiovascular: Negative.  Negative for chest pain.  Gastrointestinal: Negative.  Negative for nausea, vomiting, abdominal pain and diarrhea.  Genitourinary: Positive for vaginal discharge. Negative for dysuria.  Musculoskeletal: Negative.  Negative for back pain.  Skin: Negative.  Negative for color change and rash.  Neurological: Negative.  Negative for syncope and headaches.  Hematological: Negative.  Negative for adenopathy.  Psychiatric/Behavioral: Negative.  Negative for confusion.  All other systems reviewed and are negative.    Allergies  Aspirin and Collagenase  Home Medications   Current Outpatient Rx  Name Route Sig Dispense Refill  . HYDROCOD POLST-CHLORPHEN POLST 10-8 MG/5ML PO LQCR Oral Take 5 mLs by mouth at bedtime as needed. 140 mL 0  . HYDROCOD POLST-CHLORPHEN POLST 10-8 MG/5ML PO LQCR Oral Take 5 mLs by mouth every 12 (twelve) hours as needed. 120 mL 0  . CYCLOBENZAPRINE HCL 5 MG PO TABS Oral Take 5 mg by mouth 3 (three) times daily as needed. For muscle spasm     . GUAIFENESIN 100 MG/5ML PO LIQD Oral Take 200 mg by mouth 3 (three) times daily as needed. For cough     . MUPIROCIN 2 % EX OINT Topical Apply topically 2 (two) times daily.      Marland Kitchen  NAPROXEN 500 MG PO TABS Oral Take 500 mg by mouth 2 (two) times daily with meals. As needed for headache and back pain     . ORTHO TRI-CYCLEN LO PO Oral Take 1 tablet by mouth daily.      Marland Kitchen PSEUDOEPHEDRINE-METHSCOPOLAMIN 120-2.5 MG PO TB12 Oral Take by mouth 2 (two) times daily.       BP 129/100  Pulse 84  Temp(Src) 98.7 F (37.1 C) (Oral)  Resp 20  Ht 5\' 4"  (1.626 m)  Wt 240 lb (108.863 kg)  BMI 41.20 kg/m2  SpO2 100%  LMP 09/01/2011  Physical Exam  Constitutional: She is oriented to person, place, and time. She appears well-developed and well-nourished.  HENT:  Head:  Normocephalic and atraumatic.  Eyes: Conjunctivae and EOM are normal. Pupils are equal, round, and reactive to light.  Neck: Normal range of motion. Neck supple.  Pulmonary/Chest: Effort normal.  Abdominal: Soft. There is no tenderness. There is no rebound and no guarding.  Genitourinary: Vaginal discharge found.       Examination chaperoned by Nettie Elm, RN.  Patient has moderate amount of white discharge present in the vaginal vault.  No blood present.  No cervical motion tenderness.  Musculoskeletal: Normal range of motion.  Neurological: She is alert and oriented to person, place, and time.  Skin: Skin is warm and dry. No erythema.  Psychiatric: She has a normal mood and affect. Her behavior is normal. Judgment and thought content normal.    ED Course  Procedures (including critical care time)  Results for orders placed during the hospital encounter of 09/09/11  PREGNANCY, URINE      Component Value Range   Preg Test, Ur NEGATIVE    URINALYSIS, ROUTINE W REFLEX MICROSCOPIC      Component Value Range   Color, Urine YELLOW  YELLOW    APPearance CLEAR  CLEAR    Specific Gravity, Urine 1.027  1.005 - 1.030    pH 6.5  5.0 - 8.0    Glucose, UA NEGATIVE  NEGATIVE (mg/dL)   Hgb urine dipstick NEGATIVE  NEGATIVE    Bilirubin Urine NEGATIVE  NEGATIVE    Ketones, ur NEGATIVE  NEGATIVE (mg/dL)   Protein, ur NEGATIVE  NEGATIVE (mg/dL)   Urobilinogen, UA 1.0  0.0 - 1.0 (mg/dL)   Nitrite NEGATIVE  NEGATIVE    Leukocytes, UA NEGATIVE  NEGATIVE   WET PREP, GENITAL      Component Value Range   Yeast, Wet Prep NONE SEEN  NONE SEEN    Trich, Wet Prep NONE SEEN  NONE SEEN    Clue Cells, Wet Prep NONE SEEN  NONE SEEN    WBC, Wet Prep HPF POC MANY (*) NONE SEEN        MDM  Patient's vaginal discharge did have a slight cottage cheesy appearance despite the wet prep results I will place the patient on Diflucan and have her followup with her primary care physician.        Nat Christen, MD 09/09/11 1610  Nat Christen, MD 09/09/11 [phone]

## 2011-09-11 LAB — GC/CHLAMYDIA PROBE AMP, GENITAL
Chlamydia, DNA Probe: NEGATIVE
GC Probe Amp, Genital: NEGATIVE

## 2011-11-04 ENCOUNTER — Encounter: Payer: Self-pay | Admitting: Family Medicine

## 2011-11-04 ENCOUNTER — Ambulatory Visit (INDEPENDENT_AMBULATORY_CARE_PROVIDER_SITE_OTHER): Payer: 59 | Admitting: Family Medicine

## 2011-11-04 DIAGNOSIS — J019 Acute sinusitis, unspecified: Secondary | ICD-10-CM

## 2011-11-04 MED ORDER — AMOXICILLIN 875 MG PO TABS: 875.0000 mg | ORAL_TABLET | Freq: Two times a day (BID) | ORAL | Status: AC

## 2011-11-04 NOTE — Progress Notes (Signed)
  Subjective:    Patient ID: Ashley Henry, female    DOB: 02-01-1976, 36 y.o.   MRN: [id number]  HPI  6 day history of left earache. Intermittent sore throat. Left maxillofacial pain. Nasal congestion. Pain with swallowing. Denies productive cough past few days. Intermittent chills but no documented fever. No relief with over-the-counter medications.   Review of Systems  Constitutional: Positive for chills. Negative for fever.  HENT: Positive for ear pain and sore throat.   Respiratory: Positive for cough.   Neurological: Negative for headaches.       Objective:   Physical Exam  Constitutional: She appears well-developed and well-nourished.  HENT:  Right Ear: External ear normal.  Left Ear: External ear normal.       Mild posterior pharynx erythema. No exudate  Cardiovascular: Normal rate and regular rhythm.   Pulmonary/Chest: Effort normal and breath sounds normal. No respiratory distress. She has no wheezes. She has no rales.  Lymphadenopathy:    She has no cervical adenopathy.          Assessment & Plan:  URI. Possible left maxillary sinusitis. Amoxicillin 875 mg twice a day for 10 days

## 2011-11-04 NOTE — Patient Instructions (Signed)

## 2012-04-20 ENCOUNTER — Encounter: Payer: Self-pay | Admitting: Family Medicine

## 2012-04-20 ENCOUNTER — Ambulatory Visit (INDEPENDENT_AMBULATORY_CARE_PROVIDER_SITE_OTHER): Payer: Managed Care, Other (non HMO) | Admitting: Family Medicine

## 2012-04-20 VITALS — BP 110/80 | Temp 98.7°F | Wt 265.0 lb

## 2012-04-20 DIAGNOSIS — J329 Chronic sinusitis, unspecified: Secondary | ICD-10-CM

## 2012-04-20 MED ORDER — PSEUDOEPHEDRINE-METHSCOPOLAMIN 120-2.5 MG PO TB12: 1.0000 | ORAL_TABLET | Freq: Two times a day (BID) | ORAL | Status: DC | PRN

## 2012-04-20 MED ORDER — AMOXICILLIN 875 MG PO TABS: 875.0000 mg | ORAL_TABLET | Freq: Two times a day (BID) | ORAL | Status: AC

## 2012-04-20 NOTE — Patient Instructions (Addendum)

## 2012-04-20 NOTE — Progress Notes (Signed)
  Subjective:    Patient ID: Ashley Henry, female    DOB: [birth year], 36 y.o.   MRN: [id number]  HPI  Patient seen with onset of upper respiratory symptoms about 8-10 days ago. Nasal congestion. Progressive maxillary facial pain, headaches, and some brownish discharge past couple days. No definite fevers. Increased malaise. Intermittent mild sore throat. Occasional dry cough. No nausea or vomiting. Has previously taken Allerx D. which helped. Requesting refills. No known antibiotic allergies   Review of Systems  Constitutional: Positive for fatigue. Negative for fever and chills.  HENT: Positive for congestion and sinus pressure.   Respiratory: Positive for cough.        Objective:   Physical Exam  Constitutional: She appears well-developed and well-nourished. No distress.  HENT:  Right Ear: External ear normal.  Left Ear: External ear normal.  Nose: Nose normal.  Mouth/Throat: Oropharynx is clear and moist.  Neck: Neck supple.  Cardiovascular: Normal rate and regular rhythm.   Pulmonary/Chest: Effort normal and breath sounds normal. No respiratory distress. She has no wheezes. She has no rales.  Lymphadenopathy:    She has no cervical adenopathy.          Assessment & Plan:  Recent URI. Probable bilateral maxillary sinusitis. Amoxicillin 875 mg twice daily for 10 days

## 2012-05-07 ENCOUNTER — Ambulatory Visit (INDEPENDENT_AMBULATORY_CARE_PROVIDER_SITE_OTHER): Payer: Managed Care, Other (non HMO) | Admitting: Gynecology

## 2012-05-07 ENCOUNTER — Encounter: Payer: Self-pay | Admitting: Gynecology

## 2012-05-07 VITALS — BP 148/88 | Ht 63.75 in | Wt 261.0 lb

## 2012-05-07 DIAGNOSIS — Z01419 Encounter for gynecological examination (general) (routine) without abnormal findings: Secondary | ICD-10-CM

## 2012-05-07 MED ORDER — NORGESTIM-ETH ESTRAD TRIPHASIC 0.18/0.215/0.25 MG-25 MCG PO TABS: 1.0000 | ORAL_TABLET | Freq: Every day | ORAL | Status: DC

## 2012-05-07 NOTE — Patient Instructions (Addendum)

## 2012-05-07 NOTE — Progress Notes (Signed)
Ashley Henry Mar 04, 1976 [id number]   History:    36 y.o.  for annual gyn exam with no complaints today only requesting refill for oral contraceptive pill. She is currently on Ortho Tri-Cyclen Lo and is having normal menstrual cycles. Patient is doing her monthly self breast examination. Patient with no prior history of abnormal Pap smear.  Past medical history,surgical history, family history and social history were all reviewed and documented in the EPIC chart.  Gynecologic History Patient's last menstrual period was 04/12/2012. Contraception: OCP (estrogen/progesterone) Last Pap: 2012. Results were: normal Last mammogram: Not indicated. Results were: Not indicated  Obstetric History OB History    Grav Para Term Preterm Abortions TAB SAB Ect Mult Living   2 1   1  1   1      # Outc Date GA Lbr Len/2nd Wgt Sex Del Anes PTL Lv   1 PAR            2 SAB                ROS: A ROS was performed and pertinent positives and negatives are included in the history.  GENERAL: No fevers or chills. HEENT: No change in vision, no earache, sore throat or sinus congestion. NECK: No pain or stiffness. CARDIOVASCULAR: No chest pain or pressure. No palpitations. PULMONARY: No shortness of breath, cough or wheeze. GASTROINTESTINAL: No abdominal pain, nausea, vomiting or diarrhea, melena or bright red blood per rectum. GENITOURINARY: No urinary frequency, urgency, hesitancy or dysuria. MUSCULOSKELETAL: No joint or muscle pain, no back pain, no recent trauma. DERMATOLOGIC: No rash, no itching, no lesions. ENDOCRINE: No polyuria, polydipsia, no heat or cold intolerance. No recent change in weight. HEMATOLOGICAL: No anemia or easy bruising or bleeding. NEUROLOGIC: No headache, seizures, numbness, tingling or weakness. PSYCHIATRIC: No depression, no loss of interest in normal activity or change in sleep pattern.     Exam: chaperone present  BP 148/88  Ht 5' 3.75" (1.619 m)  Wt 261 lb (118.389 kg)  BMI 45.15  kg/m2  LMP 04/12/2012  Body mass index is 45.15 kg/(m^2).  General appearance : Well developed well nourished female. No acute distress HEENT: Neck supple, trachea midline, no carotid bruits, no thyroidmegaly Lungs: Clear to auscultation, no rhonchi or wheezes, or rib retractions  Heart: Regular rate and rhythm, no murmurs or gallops Breast:Examined in sitting and supine position were symmetrical in appearance, no palpable masses or tenderness,  no skin retraction, no nipple inversion, no nipple discharge, no skin discoloration, no axillary or supraclavicular lymphadenopathy Abdomen: no palpable masses or tenderness, no rebound or guarding Extremities: no edema or skin discoloration or tenderness  Pelvic:  Bartholin, Urethra, Skene Glands: Within normal limits             Vagina: No gross lesions or discharge  Cervix: No gross lesions or discharge  Uterus  anteverted, normal size, shape and consistency, non-tender and mobile  Adnexa  Without masses or tenderness  Anus and perineum  normal   Rectovaginal  normal sphincter tone without palpated masses or tenderness             Hemoccult not done     Assessment/Plan:  36 y.o. female for annual exam with no abnormalities. Patient's primary physician does her annual blood work so no blood work was drawn today. We discussed importance of exercise on a regular basis and she is overweight. We discussed importance of monthly self breast examination. New Pap smear screening guidelines discussed. No Pap  smear done today. We'll see her back in one year or when necessary.    Ok Edwards MD, 2:05 PM 05/07/2012

## 2012-09-14 ENCOUNTER — Ambulatory Visit (INDEPENDENT_AMBULATORY_CARE_PROVIDER_SITE_OTHER): Payer: Managed Care, Other (non HMO) | Admitting: Family Medicine

## 2012-09-14 ENCOUNTER — Encounter: Payer: Self-pay | Admitting: Family Medicine

## 2012-09-14 VITALS — BP 110/72 | HR 100 | Temp 98.9°F | Resp 12 | Wt 260.0 lb

## 2012-09-14 DIAGNOSIS — J019 Acute sinusitis, unspecified: Secondary | ICD-10-CM

## 2012-09-14 MED ORDER — HYDROCOD POLST-CHLORPHEN POLST 10-8 MG/5ML PO LQCR: 5.0000 mL | Freq: Two times a day (BID) | ORAL | Status: DC | PRN

## 2012-09-14 MED ORDER — AMOXICILLIN 875 MG PO TABS: 875.0000 mg | ORAL_TABLET | Freq: Two times a day (BID) | ORAL | Status: DC

## 2012-09-14 NOTE — Progress Notes (Signed)
  Subjective:    Patient ID: Ashley Henry, female    DOB: 1976-04-02, 36 y.o.   MRN: [id number]  HPI 2 week history of progressive upper respirations symptoms. Patient relates nasal congestion with progressive bilateral maxillary facial pressure and cough productive of brown mucus past couple days. She developed fever 101.9 over the weekend but none today. She has mild headaches. Frequent postnasal drip symptoms. Not relieved with over-the-counter medications. She is nonsmoker. Denies any nausea or vomiting. Tried nasal saline irrigation without improvement  Review of Systems  Constitutional: Negative for fever and chills.  HENT: Positive for congestion, postnasal drip and sinus pressure.   Respiratory: Positive for cough. Negative for shortness of breath and wheezing.        Objective:   Physical Exam  Constitutional: She appears well-developed and well-nourished.  HENT:  Right Ear: External ear normal.  Left Ear: External ear normal.  Mouth/Throat: Oropharynx is clear and moist.  Neck: Neck supple.  Cardiovascular: Normal rate and regular rhythm.   Pulmonary/Chest: Effort normal and breath sounds normal. No respiratory distress. She has no wheezes. She has no rales.  Lymphadenopathy:    She has no cervical adenopathy.          Assessment & Plan:  Acute sinusitis. Given duration of symptoms, start amoxicillin 875 mg twice daily for 10 days Tussionex 1 teaspoon each bedtime as needed for cough

## 2012-09-14 NOTE — Patient Instructions (Addendum)

## 2012-09-21 ENCOUNTER — Other Ambulatory Visit: Payer: Self-pay | Admitting: Family Medicine

## 2012-09-21 NOTE — Telephone Encounter (Signed)
Pt DID develop yeast infection from antibiotics, and per Dr Caryl Never, he will call in something for that if needed. She needs!! Pharm, [facility] [address]

## 2012-09-22 MED ORDER — FLUCONAZOLE 150 MG PO TABS: 150.0000 mg | ORAL_TABLET | Freq: Once | ORAL | Status: DC

## 2012-09-22 NOTE — Telephone Encounter (Signed)
Fluconazole 150 mg one dose

## 2012-09-22 NOTE — Telephone Encounter (Signed)
Pt informed

## 2013-04-06 ENCOUNTER — Other Ambulatory Visit: Payer: Self-pay | Admitting: Gynecology

## 2013-05-11 ENCOUNTER — Encounter: Payer: Self-pay | Admitting: Gynecology

## 2013-05-11 ENCOUNTER — Ambulatory Visit (INDEPENDENT_AMBULATORY_CARE_PROVIDER_SITE_OTHER): Payer: Managed Care, Other (non HMO) | Admitting: Gynecology

## 2013-05-11 ENCOUNTER — Ambulatory Visit (INDEPENDENT_AMBULATORY_CARE_PROVIDER_SITE_OTHER): Payer: Managed Care, Other (non HMO)

## 2013-05-11 VITALS — BP 124/80 | Ht 64.0 in | Wt 270.0 lb

## 2013-05-11 DIAGNOSIS — N942 Vaginismus: Secondary | ICD-10-CM

## 2013-05-11 DIAGNOSIS — L909 Atrophic disorder of skin, unspecified: Secondary | ICD-10-CM

## 2013-05-11 DIAGNOSIS — L680 Hirsutism: Secondary | ICD-10-CM

## 2013-05-11 DIAGNOSIS — Z01419 Encounter for gynecological examination (general) (routine) without abnormal findings: Secondary | ICD-10-CM

## 2013-05-11 DIAGNOSIS — E65 Localized adiposity: Secondary | ICD-10-CM

## 2013-05-11 DIAGNOSIS — N8300 Follicular cyst of ovary, unspecified side: Secondary | ICD-10-CM

## 2013-05-11 LAB — CBC WITH DIFFERENTIAL/PLATELET
Basophils Absolute: 0 10*3/uL (ref 0.0–0.1)
Basophils Relative: 0 % (ref 0–1)
Eosinophils Absolute: 0.1 10*3/uL (ref 0.0–0.7)
Eosinophils Relative: 1 % (ref 0–5)
HCT: 30.8 % — ABNORMAL LOW (ref 36.0–46.0)
Hemoglobin: 9.9 g/dL — ABNORMAL LOW (ref 12.0–15.0)
Lymphocytes Relative: 25 % (ref 12–46)
Lymphs Abs: 2.7 10*3/uL (ref 0.7–4.0)
MCH: 21.6 pg — ABNORMAL LOW (ref 26.0–34.0)
MCHC: 32.1 g/dL (ref 30.0–36.0)
MCV: 67.2 fL — ABNORMAL LOW (ref 78.0–100.0)
Monocytes Absolute: 0.5 10*3/uL (ref 0.1–1.0)
Monocytes Relative: 5 % (ref 3–12)
Neutro Abs: 7.4 10*3/uL (ref 1.7–7.7)
Neutrophils Relative %: 69 % (ref 43–77)
Platelets: 438 10*3/uL — ABNORMAL HIGH (ref 150–400)
RBC: 4.58 MIL/uL (ref 3.87–5.11)
RDW: 18.6 % — ABNORMAL HIGH (ref 11.5–15.5)
WBC: 10.8 10*3/uL — ABNORMAL HIGH (ref 4.0–10.5)

## 2013-05-11 LAB — COMPREHENSIVE METABOLIC PANEL
ALT: 13 U/L (ref 0–35)
AST: 11 U/L (ref 0–37)
Albumin: 3.9 g/dL (ref 3.5–5.2)
Alkaline Phosphatase: 60 U/L (ref 39–117)
BUN: 14 mg/dL (ref 6–23)
CO2: 29 mEq/L (ref 19–32)
Calcium: 9.3 mg/dL (ref 8.4–10.5)
Chloride: 101 mEq/L (ref 96–112)
Creat: 0.9 mg/dL (ref 0.50–1.10)
Glucose, Bld: 109 mg/dL — ABNORMAL HIGH (ref 70–99)
Potassium: 4.1 mEq/L (ref 3.5–5.3)
Sodium: 139 mEq/L (ref 135–145)
Total Bilirubin: 0.2 mg/dL — ABNORMAL LOW (ref 0.3–1.2)
Total Protein: 7.8 g/dL (ref 6.0–8.3)

## 2013-05-11 LAB — TSH: TSH: 0.92 u[IU]/mL (ref 0.350–4.500)

## 2013-05-11 LAB — CHOLESTEROL, TOTAL: Cholesterol: 191 mg/dL (ref 0–200)

## 2013-05-11 MED ORDER — NORGESTIM-ETH ESTRAD TRIPHASIC 0.18/0.215/0.25 MG-25 MCG PO TABS: 1.0000 | ORAL_TABLET | Freq: Every day | ORAL | Status: DC

## 2013-05-11 NOTE — Progress Notes (Signed)
Ashley Henry Oct 18, 1975 [id number]   History:    37 y.o.  for annual gyn exam with no complaints today. Patient requesting prescription refill on her Ortho Tri-Cyclen low oral contraceptive pill. Patient is having normal menstrual cycles. Patient's Tdap vaccine is up to date. Patient does her monthly breast exam. Patient would know prior history of abnormal Pap smears. Patient with prior history of reduction mammoplasty.  Past medical history,surgical history, family history and social history were all reviewed and documented in the EPIC chart.  Gynecologic History Patient's last menstrual period was 05/06/2013. Contraception: OCP (estrogen/progesterone) Last Pap: 2012. Results were: normal Last mammogram: not indicated. Results were: normal  Obstetric History OB History  Gravida Para Term Preterm AB SAB TAB Ectopic Multiple Living  2 1   1 1    1     # Outcome Date GA Lbr Len/2nd Weight Sex Delivery Anes PTL Lv  2 SAB           1 PAR                ROS: A ROS was performed and pertinent positives and negatives are included in the history.  GENERAL: No fevers or chills. HEENT: No change in vision, no earache, sore throat or sinus congestion. NECK: No pain or stiffness. CARDIOVASCULAR: No chest pain or pressure. No palpitations. PULMONARY: No shortness of breath, cough or wheeze. GASTROINTESTINAL: No abdominal pain, nausea, vomiting or diarrhea, melena or bright red blood per rectum. GENITOURINARY: No urinary frequency, urgency, hesitancy or dysuria. MUSCULOSKELETAL: No joint or muscle pain, no back pain, no recent trauma. DERMATOLOGIC: No rash, no itching, no lesions. ENDOCRINE: No polyuria, polydipsia, no heat or cold intolerance. No recent change in weight. HEMATOLOGICAL: No anemia or easy bruising or bleeding. NEUROLOGIC: No headache, seizures, numbness, tingling or weakness. PSYCHIATRIC: No depression, no loss of interest in normal activity or change in sleep pattern.      Exam: chaperone present  BP 124/80  Ht 5\' 4"  (1.626 m)  Wt 270 lb (122.471 kg)  BMI 46.32 kg/m2  LMP 05/06/2013  Body mass index is 46.32 kg/(m^2).  General appearance : Well developed well nourished female. No acute distress HEENT: Neck supple, trachea midline, no carotid bruits, no thyroidmegaly Lungs: Clear to auscultation, no rhonchi or wheezes, or rib retractions  Heart: Regular rate and rhythm, no murmurs or gallops Breast:Examined in sitting and supine position were symmetrical in appearance, no palpable masses or tenderness,  no skin retraction, no nipple inversion, no nipple discharge, no skin discoloration, no axillary or supraclavicular lymphadenopathy Abdomen: no palpable masses or tenderness, no rebound or guarding Extremities: no edema or skin discoloration or tenderness  Pelvic:  Bartholin, Urethra, Skene Glands: Within normal limits             Vagina: No gross lesions or discharge  Cervix: No gross lesions or discharge  Uterus  Difficult to examine due to patient's abdominal girth and vaginismus  Adnexa  Difficult to examine due to patient's abdominal girth and vaginismus  Anus and perineum  normal   Rectovaginal  normal sphincter tone without palpated masses or tenderness             Hemoccult not indicated   Ultrasound today: Uterus measuring 9.1 x 8.0 x 5.0 cm with endometrial stripe a 6 mm. Endometrial tried layer. C-section scar was noted. Right and left ovary were normal. Left orifice several follicles from her of the ovary. No fluid in the cul-de-sac. Essentially normal ultrasound.  Assessment/Plan:  37 y.o. female for annual exam Was reminded to do her monthly breast exam. Pap smear not done today the new guidelines discussed. We discussed importance of calcium and vitamin D and regular exercise for osteoporosis prevention. The following labs were ordered: CBC, screening cholesterol, TSH, comprehensive metabolic panel, and  urinalysis.    Ok Edwards MD, 11:10 AM 05/11/2013

## 2013-05-12 LAB — URINALYSIS W MICROSCOPIC + REFLEX CULTURE
Bacteria, UA: NONE SEEN
Bilirubin Urine: NEGATIVE
Casts: NONE SEEN
Crystals: NONE SEEN
Glucose, UA: NEGATIVE mg/dL
Ketones, ur: NEGATIVE mg/dL
Leukocytes, UA: NEGATIVE
Nitrite: NEGATIVE
Protein, ur: NEGATIVE mg/dL
Specific Gravity, Urine: 1.018 (ref 1.005–1.030)
Squamous Epithelial / LPF: NONE SEEN
Urobilinogen, UA: 0.2 mg/dL (ref 0.0–1.0)
pH: 5.5 (ref 5.0–8.0)

## 2013-05-13 ENCOUNTER — Other Ambulatory Visit: Payer: Self-pay | Admitting: Gynecology

## 2013-05-13 DIAGNOSIS — D649 Anemia, unspecified: Secondary | ICD-10-CM

## 2013-05-14 ENCOUNTER — Telehealth: Payer: Self-pay

## 2013-05-14 ENCOUNTER — Encounter: Payer: Self-pay | Admitting: Internal Medicine

## 2013-05-14 NOTE — Telephone Encounter (Signed)
Yes I would like for her to follow with GI because of anemia and family history. Thanks

## 2013-05-14 NOTE — Telephone Encounter (Signed)
See note below. Patient did confirm that two days of her menses is moderate to heavy flow.  Do you want her to see GI for further work-up?

## 2013-05-14 NOTE — Telephone Encounter (Signed)
Message copied by Keenan Bachelor on Fri May 14, 2013 10:05 AM ------      Message from: Ok Edwards      Created: Wed May 12, 2013  8:29 AM       Please find out with the patient how many days her menstrual cycles are lasting on the oral contraceptive pills and how heavy is it. She appears to be anemic whereby her hemoglobin is 9.9 and normal is 12-15. I reviewed her family history and she had stated that she has 2 family members who have had stomach cancer. If her cycles are 3-4 days regular and not heavy she needs to be further evaluated by gastroenterologist to determine the cause for her severe anemia. I would recommend her to see Dr. Sheryn Bison at [facility]. Meanwhile I would like her to begin taking one iron tablet twice a day. I would like to repeat her CBC in 3 months after she starts the iron tablet. ------

## 2013-05-14 NOTE — Telephone Encounter (Signed)
Appointment scheduled with Dr. Stan Head at [facility] for 06/17/13 10:30am.  Patient advised.

## 2013-06-17 ENCOUNTER — Ambulatory Visit: Payer: Managed Care, Other (non HMO) | Admitting: Internal Medicine

## 2013-07-16 ENCOUNTER — Telehealth: Payer: Self-pay | Admitting: Family Medicine

## 2013-07-16 NOTE — Telephone Encounter (Signed)
You can put her in a same day slot. If fever gets to high she needs to go to the ED or UC.

## 2013-07-16 NOTE — Telephone Encounter (Signed)
Pt has congestion, has fever, brown mucus, and absolutely refused to see another provider!! Would like to see Dr Caryl Never on Mon. Pls advise

## 2013-07-16 NOTE — Telephone Encounter (Signed)
lmom for pt to come in at 9:45 due to cancellation

## 2013-07-16 NOTE — Telephone Encounter (Signed)
LM for pt to come in at 3:30 Monday. Also asked pt to call back for further instructions.

## 2013-07-19 ENCOUNTER — Ambulatory Visit (INDEPENDENT_AMBULATORY_CARE_PROVIDER_SITE_OTHER): Payer: Managed Care, Other (non HMO) | Admitting: Family Medicine

## 2013-07-19 ENCOUNTER — Ambulatory Visit: Payer: Managed Care, Other (non HMO) | Admitting: Family Medicine

## 2013-07-19 ENCOUNTER — Encounter: Payer: Self-pay | Admitting: Family Medicine

## 2013-07-19 VITALS — BP 134/80 | HR 100 | Temp 98.3°F | Wt 275.0 lb

## 2013-07-19 DIAGNOSIS — J209 Acute bronchitis, unspecified: Secondary | ICD-10-CM

## 2013-07-19 MED ORDER — AMOXICILLIN 875 MG PO TABS: 875.0000 mg | ORAL_TABLET | Freq: Two times a day (BID) | ORAL | Status: AC

## 2013-07-19 MED ORDER — HYDROCOD POLST-CHLORPHEN POLST 10-8 MG/5ML PO LQCR: 5.0000 mL | Freq: Two times a day (BID) | ORAL | Status: DC | PRN

## 2013-07-19 NOTE — Patient Instructions (Signed)

## 2013-07-19 NOTE — Progress Notes (Signed)
  Subjective:    Patient ID: Ashley Henry, female    DOB: 10-11-1975, 37 y.o.   MRN: [id number]  HPI Acute visit Over one week history of cough productive of brown to bloody mucus. Nonsmoker. No wheezing. Denies any dyspnea or pleuritic pain. Has had some nasal congestion. No nausea or vomiting. Has tried over-the-counter medications without much relief of cough. No sick contacts. No recent travels.  Past Medical History  Diagnosis Date  . HYPERTENSION 09/07/2009  . Headache(784.0) 08/03/2010  . HYPERLIPIDEMIA 10/11/2009   Past Surgical History  Procedure Laterality Date  . Cesarean section  2004  . Breast reduction surgery  1999    reports that she has never smoked. She has never used smokeless tobacco. She reports that she does not drink alcohol or use illicit drugs. family history includes Diabetes in her mother; Hypertension in her father and mother; Lung cancer in her maternal grandfather; Prostate cancer in her maternal uncle; Stomach cancer in her maternal uncle and paternal grandmother; Thyroid disease in her maternal uncle. Allergies  Allergen Reactions  . Aspirin     REACTION: rash  . Collagenase     REACTION: hives      Review of Systems  Constitutional: Positive for fatigue. Negative for fever and chills.  HENT: Positive for sinus pressure.   Respiratory: Positive for cough. Negative for shortness of breath and wheezing.   Cardiovascular: Negative for chest pain.       Objective:   Physical Exam  Constitutional: She appears well-developed and well-nourished.  HENT:  Right Ear: External ear normal.  Left Ear: External ear normal.  Mouth/Throat: Oropharynx is clear and moist.  Neck: Neck supple. No thyromegaly present.  Cardiovascular: Normal rate and regular rhythm.   Pulmonary/Chest: Effort normal and breath sounds normal. No respiratory distress. She has no wheezes. She has no rales.          Assessment & Plan:  Acute bronchial illness. Patient  describes coughing up rust-colored sputum but does not have any fever at this point. Reported fever 2 days ago. Start amoxicillin 8 or 75 mg twice daily for 10 days. Tussionex 1 teaspoon each bedtime when necessary cough

## 2013-07-28 ENCOUNTER — Telehealth: Payer: Self-pay | Admitting: Family Medicine

## 2013-07-28 MED ORDER — FLUCONAZOLE 150 MG PO TABS: 150.0000 mg | ORAL_TABLET | Freq: Once | ORAL | Status: DC

## 2013-07-28 NOTE — Telephone Encounter (Signed)
RX sent to pharmacy  

## 2013-07-28 NOTE — Telephone Encounter (Signed)
Call in fluconazole 150 mg x1 dose

## 2013-07-28 NOTE — Telephone Encounter (Signed)
Pt was seen on 07-19-13. Does she need to come in to be seen

## 2013-07-28 NOTE — Telephone Encounter (Signed)
Pt was on abx and now has yeast infection. Please call diflucan into [facility] [address]

## 2014-05-02 ENCOUNTER — Other Ambulatory Visit: Payer: Self-pay | Admitting: Gynecology

## 2014-05-26 ENCOUNTER — Ambulatory Visit (INDEPENDENT_AMBULATORY_CARE_PROVIDER_SITE_OTHER): Payer: Managed Care, Other (non HMO) | Admitting: Gynecology

## 2014-05-26 ENCOUNTER — Encounter: Payer: Self-pay | Admitting: Gynecology

## 2014-05-26 ENCOUNTER — Other Ambulatory Visit (HOSPITAL_COMMUNITY)
Admission: RE | Admit: 2014-05-26 | Discharge: 2014-05-26 | Disposition: A | Discharge status: Home / Self Care | Payer: Managed Care, Other (non HMO) | Source: Ambulatory Visit | Attending: Gynecology | Admitting: Gynecology

## 2014-05-26 VITALS — BP 132/86 | Ht 64.0 in | Wt 269.0 lb

## 2014-05-26 DIAGNOSIS — Z01419 Encounter for gynecological examination (general) (routine) without abnormal findings: Secondary | ICD-10-CM | POA: Diagnosis present

## 2014-05-26 DIAGNOSIS — Z1151 Encounter for screening for human papillomavirus (HPV): Secondary | ICD-10-CM | POA: Diagnosis present

## 2014-05-26 DIAGNOSIS — N942 Vaginismus: Secondary | ICD-10-CM

## 2014-05-26 LAB — CBC WITH DIFFERENTIAL/PLATELET
Basophils Absolute: 0 10*3/uL (ref 0.0–0.1)
Basophils Relative: 0 % (ref 0–1)
Eosinophils Absolute: 0.1 10*3/uL (ref 0.0–0.7)
Eosinophils Relative: 1 % (ref 0–5)
HCT: 31.5 % — ABNORMAL LOW (ref 36.0–46.0)
Hemoglobin: 9.7 g/dL — ABNORMAL LOW (ref 12.0–15.0)
Lymphocytes Relative: 27 % (ref 12–46)
Lymphs Abs: 3.4 10*3/uL (ref 0.7–4.0)
MCH: 20.6 pg — ABNORMAL LOW (ref 26.0–34.0)
MCHC: 30.8 g/dL (ref 30.0–36.0)
MCV: 67 fL — ABNORMAL LOW (ref 78.0–100.0)
Monocytes Absolute: 0.6 10*3/uL (ref 0.1–1.0)
Monocytes Relative: 5 % (ref 3–12)
Neutro Abs: 8.5 10*3/uL — ABNORMAL HIGH (ref 1.7–7.7)
Neutrophils Relative %: 67 % (ref 43–77)
Platelets: 427 10*3/uL — ABNORMAL HIGH (ref 150–400)
RBC: 4.7 MIL/uL (ref 3.87–5.11)
RDW: 19.9 % — ABNORMAL HIGH (ref 11.5–15.5)
WBC: 12.7 10*3/uL — ABNORMAL HIGH (ref 4.0–10.5)

## 2014-05-26 MED ORDER — NORGESTIM-ETH ESTRAD TRIPHASIC 0.18/0.215/0.25 MG-25 MCG PO TABS: 1.0000 | ORAL_TABLET | Freq: Every day | ORAL | Status: DC

## 2014-05-26 NOTE — Progress Notes (Signed)
Ashley Henry 1976/01/15 [id number]   History:    38 y.o.  for annual gyn exam with no complaints today.Patient requesting prescription refill on her Ortho Tri-Cyclen low oral contraceptive pill. Patient is having normal menstrual cycles. Patient's Tdap vaccine is up to date. Patient declined flu vaccine. No past history of abnormal Pap smears. Patient several years ago had reduction mammoplasty. Dictations vaginismus and vagina and morbidly obese an ultrasound had been ordered last year which demonstrated the following:   Uterus measuring 9.1 x 8.0 x 5.0 cm with endometrial stripe a 6 mm. Endometrial tried layer. C-section scar was noted. Right and left ovary were normal. Left orifice several follicles from her of the ovary. No fluid in the cul-de-sac. Essentially normal ultrasound   Past medical history,surgical history, family history and social history were all reviewed and documented in the EPIC chart.  Gynecologic History Patient's last menstrual period was 05/12/2014. Contraception: OCP (estrogen/progesterone) Last Pap: 2012. Results were: normal Last mammogram: None indicated. Results were: Not indicated  Obstetric History OB History  Gravida Para Term Preterm AB SAB TAB Ectopic Multiple Living  2 1   1 1    1     # Outcome Date GA Lbr Len/2nd Weight Sex Delivery Anes PTL Lv  2 SAB           1 PAR                ROS: A ROS was performed and pertinent positives and negatives are included in the history.  GENERAL: No fevers or chills. HEENT: No change in vision, no earache, sore throat or sinus congestion. NECK: No pain or stiffness. CARDIOVASCULAR: No chest pain or pressure. No palpitations. PULMONARY: No shortness of breath, cough or wheeze. GASTROINTESTINAL: No abdominal pain, nausea, vomiting or diarrhea, melena or bright red blood per rectum. GENITOURINARY: No urinary frequency, urgency, hesitancy or dysuria. MUSCULOSKELETAL: No joint or muscle pain, no back pain, no  recent trauma. DERMATOLOGIC: No rash, no itching, no lesions. ENDOCRINE: No polyuria, polydipsia, no heat or cold intolerance. No recent change in weight. HEMATOLOGICAL: No anemia or easy bruising or bleeding. NEUROLOGIC: No headache, seizures, numbness, tingling or weakness. PSYCHIATRIC: No depression, no loss of interest in normal activity or change in sleep pattern.     Exam: chaperone present  BP 132/86  Ht 5\' 4"  (1.626 m)  Wt 269 lb (122.018 kg)  BMI 46.15 kg/m2  LMP 05/12/2014  Body mass index is 46.15 kg/(m^2).  General appearance : Well developed well nourished female. No acute distress HEENT: Neck supple, trachea midline, no carotid bruits, no thyroidmegaly Lungs: Clear to auscultation, no rhonchi or wheezes, or rib retractions  Heart: Regular rate and rhythm, no murmurs or gallops Breast:Examined in sitting and supine position were symmetrical in appearance, no palpable masses or tenderness,  no skin retraction, no nipple inversion, no nipple discharge, no skin discoloration, no axillary or supraclavicular lymphadenopathy Abdomen: no palpable masses or tenderness, no rebound or guarding Extremities: no edema or skin discoloration or tenderness  Pelvic:  Bartholin, Urethra, Skene Glands: Within normal limits             Vagina: No gross lesions or discharge  Cervix: No gross lesions or discharge  Uterus limited to the patient's obesity vagina and vaginismus  Adnexa  same as above  Anus and perineum  normal   Rectovaginal  normal sphincter tone without palpated masses or tenderness  Hemoccult that indicated     Assessment/Plan:  38 y.o. female for annual exam who is morbidly obese with narrow vagina and vaginismus making it difficult to fully assess the uterus and ovaries. For this reason she will return back to 1-2 weeks for a pelvic ultrasound. Pap smear was done today. Prescription refill for Ortho Tri-Cyclen Lo 28 day oral contraceptive pill was provided.  Patient reports normal menstrual cycles. She was reminded of the importance of monthly self breast examination. Patient declined flu vaccine. The following laboratory: CBC, fasting lipid profile, comprehensive metabolic panel, TSH, and urinalysis.  Note: This dictation was prepared with  Dragon/digital dictation along withSmart phrase technology. Any transcriptional errors that result from this process are unintentional.   Terrance Mass MD, 1:53 PM 05/26/2014

## 2014-05-27 ENCOUNTER — Other Ambulatory Visit: Payer: Self-pay | Admitting: Gynecology

## 2014-05-27 DIAGNOSIS — D509 Iron deficiency anemia, unspecified: Secondary | ICD-10-CM

## 2014-05-27 LAB — COMPREHENSIVE METABOLIC PANEL
ALT: 9 U/L (ref 0–35)
AST: 11 U/L (ref 0–37)
Albumin: 3.7 g/dL (ref 3.5–5.2)
Alkaline Phosphatase: 69 U/L (ref 39–117)
BUN: 14 mg/dL (ref 6–23)
CO2: 23 mEq/L (ref 19–32)
Calcium: 9.1 mg/dL (ref 8.4–10.5)
Chloride: 100 mEq/L (ref 96–112)
Creat: 0.85 mg/dL (ref 0.50–1.10)
Glucose, Bld: 84 mg/dL (ref 70–99)
Potassium: 4.3 mEq/L (ref 3.5–5.3)
Sodium: 137 mEq/L (ref 135–145)
Total Bilirubin: 0.2 mg/dL (ref 0.2–1.2)
Total Protein: 7.6 g/dL (ref 6.0–8.3)

## 2014-05-27 LAB — URINALYSIS W MICROSCOPIC + REFLEX CULTURE
Bilirubin Urine: NEGATIVE
Casts: NONE SEEN
Crystals: NONE SEEN
Glucose, UA: NEGATIVE mg/dL
Hgb urine dipstick: NEGATIVE
Ketones, ur: NEGATIVE mg/dL
Leukocytes, UA: NEGATIVE
Nitrite: NEGATIVE
Protein, ur: NEGATIVE mg/dL
Specific Gravity, Urine: 1.021 (ref 1.005–1.030)
Squamous Epithelial / LPF: NONE SEEN
Urobilinogen, UA: 0.2 mg/dL (ref 0.0–1.0)
pH: 5.5 (ref 5.0–8.0)

## 2014-05-27 LAB — LIPID PANEL
Cholesterol: 167 mg/dL (ref 0–200)
HDL: 54 mg/dL (ref >39)
LDL Cholesterol: 99 mg/dL (ref 0–99)
Total CHOL/HDL Ratio: 3.1 Ratio
Triglycerides: 72 mg/dL (ref <150)
VLDL: 14 mg/dL (ref 0–40)

## 2014-05-27 LAB — TSH: TSH: 1.969 u[IU]/mL (ref 0.350–4.500)

## 2014-05-28 ENCOUNTER — Other Ambulatory Visit: Payer: Self-pay | Admitting: Gynecology

## 2014-05-30 LAB — CYTOLOGY - PAP

## 2014-06-06 ENCOUNTER — Encounter: Payer: Managed Care, Other (non HMO) | Admitting: Gynecology

## 2014-06-20 ENCOUNTER — Ambulatory Visit (INDEPENDENT_AMBULATORY_CARE_PROVIDER_SITE_OTHER): Payer: Managed Care, Other (non HMO) | Admitting: Gynecology

## 2014-06-20 ENCOUNTER — Encounter: Payer: Self-pay | Admitting: Gynecology

## 2014-06-20 ENCOUNTER — Ambulatory Visit (INDEPENDENT_AMBULATORY_CARE_PROVIDER_SITE_OTHER): Payer: Managed Care, Other (non HMO)

## 2014-06-20 ENCOUNTER — Other Ambulatory Visit: Payer: Self-pay | Admitting: Gynecology

## 2014-06-20 ENCOUNTER — Telehealth: Payer: Self-pay | Admitting: *Deleted

## 2014-06-20 DIAGNOSIS — L989 Disorder of the skin and subcutaneous tissue, unspecified: Secondary | ICD-10-CM

## 2014-06-20 DIAGNOSIS — N942 Vaginismus: Secondary | ICD-10-CM

## 2014-06-20 DIAGNOSIS — D638 Anemia in other chronic diseases classified elsewhere: Secondary | ICD-10-CM

## 2014-06-20 DIAGNOSIS — E65 Localized adiposity: Secondary | ICD-10-CM

## 2014-06-20 DIAGNOSIS — Z800 Family history of malignant neoplasm of digestive organs: Secondary | ICD-10-CM

## 2014-06-20 DIAGNOSIS — Z01419 Encounter for gynecological examination (general) (routine) without abnormal findings: Secondary | ICD-10-CM

## 2014-06-20 DIAGNOSIS — D500 Iron deficiency anemia secondary to blood loss (chronic): Secondary | ICD-10-CM

## 2014-06-20 HISTORY — DX: Iron deficiency anemia secondary to blood loss (chronic): D50.0

## 2014-06-20 LAB — IRON AND TIBC
%SAT: 6 % — ABNORMAL LOW (ref 20–55)
Iron: 25 ug/dL — ABNORMAL LOW (ref 42–145)
TIBC: 401 ug/dL (ref 250–470)
UIBC: 376 ug/dL (ref 125–400)

## 2014-06-20 LAB — FOLATE: Folate: 8.7 ng/mL

## 2014-06-20 LAB — VITAMIN B12: Vitamin B-12: 455 pg/mL (ref 211–911)

## 2014-06-20 LAB — FERRITIN: Ferritin: 12 ng/mL (ref 10–291)

## 2014-06-20 NOTE — Telephone Encounter (Signed)
Referral placed for GI MD they will contact pt to schedule.

## 2014-06-20 NOTE — Progress Notes (Signed)
   Patient is a 38 year old who presented today to discuss her ultrasound due to the limitations during her physical exam on September 10 because of her obesity. Patient is on Ortho Tri-Cyclen low 28 day oral contraceptive pill and is having normal light menstrual cycles. Her recent labs demonstrated that her hemoglobin was 9.7 and hematocrit 31.5 with hypochromic microcytic indices and platelet count 427,000. Patient has history of sickle cell trait. She informed me today since her mother was present that she has 2 uncles who have had colon cancer in her 75s.  Ultrasound today: Uterus measured 8.6 x 7.1 x 5.6 cm with endometrial stripe of 4.6 mm. Both right and left ovary were normal no adnexal masses and no fluid in the cul-de-sac.  Assessment/plan: Patient with sickle cell trait but has chronic anemia with a hemoglobin 9.7. In 2014 her hemoglobin was 9.9. Patient with 2 family members on the other side with colon cancer. For this reason I'm going to refer her to our GI colleagues for colonoscopy and further evaluation. She was provided with fecal Hemoccult cards today. We're also going to proceed with an anemia panel to check her vitamin B12 level total iron, total iron binding capacity, family and ferritin levels. Recent Pap smear was normal. Patient declined flu vaccine.

## 2014-06-20 NOTE — Telephone Encounter (Signed)
Message copied by Thamas Jaegers on Mon Jun 20, 2014 12:12 PM ------      Message from: Terrance Mass      Created: Mon Jun 20, 2014 10:49 AM       Scheduled patient appointment for this patient with [facility]. Patient with chronic anemia of hemoglobin 9.8 and 2 uncles on mother's side with colon cancer. Patient will need further evaluation such as screening colonoscopy. Anemia panel was ordered today.  ------

## 2014-06-20 NOTE — Patient Instructions (Signed)
Anemia, Nonspecific Anemia is a condition in which the concentration of red blood cells or hemoglobin in the blood is below normal. Hemoglobin is a substance in red blood cells that carries oxygen to the tissues of the body. Anemia results in not enough oxygen reaching these tissues.  CAUSES  Common causes of anemia include:   Excessive bleeding. Bleeding may be internal or external. This includes excessive bleeding from periods (in women) or from the intestine.   Poor nutrition.   Chronic kidney, thyroid, and liver disease.  Bone marrow disorders that decrease red blood cell production.  Cancer and treatments for cancer.  HIV, AIDS, and their treatments.  Spleen problems that increase red blood cell destruction.  Blood disorders.  Excess destruction of red blood cells due to infection, medicines, and autoimmune disorders. SIGNS AND SYMPTOMS   Minor weakness.   Dizziness.   Headache.  Palpitations.   Shortness of breath, especially with exercise.   Paleness.  Cold sensitivity.  Indigestion.  Nausea.  Difficulty sleeping.  Difficulty concentrating. Symptoms may occur suddenly or they may develop slowly.  DIAGNOSIS  Additional blood tests are often needed. These help your health care provider determine the best treatment. Your health care provider will check your stool for blood and look for other causes of blood loss.  TREATMENT  Treatment varies depending on the cause of the anemia. Treatment can include:   Supplements of iron, vitamin B12, or folic acid.   Hormone medicines.   A blood transfusion. This may be needed if blood loss is severe.   Hospitalization. This may be needed if there is significant continual blood loss.   Dietary changes.  Spleen removal. HOME CARE INSTRUCTIONS Keep all follow-up appointments. It often takes many weeks to correct anemia, and having your health care provider check on your condition and your response to  treatment is very important. SEEK IMMEDIATE MEDICAL CARE IF:   You develop extreme weakness, shortness of breath, or chest pain.   You become dizzy or have trouble concentrating.  You develop heavy vaginal bleeding.   You develop a rash.   You have bloody or black, tarry stools.   You faint.   You vomit up blood.   You vomit repeatedly.   You have abdominal pain.  You have a fever or persistent symptoms for more than 2-3 days.   You have a fever and your symptoms suddenly get worse.   You are dehydrated.  MAKE SURE YOU:  Understand these instructions.  Will watch your condition.  Will get help right away if you are not doing well or get worse. Document Released: 10/10/2004 Document Revised: 05/05/2013 Document Reviewed: 02/26/2013 Exit[employer] Patient Information 2015 [employer]. This information is not intended to replace advice given to you by your health care provider. Make sure you discuss any questions you have with your health care provider.  

## 2014-07-18 ENCOUNTER — Encounter: Payer: Self-pay | Admitting: Gynecology

## 2014-07-26 ENCOUNTER — Encounter: Payer: Self-pay | Admitting: Family Medicine

## 2014-07-27 NOTE — Telephone Encounter (Signed)
Pt declined to make appointment.

## 2014-10-25 ENCOUNTER — Encounter: Payer: Self-pay | Admitting: Family Medicine

## 2014-10-25 ENCOUNTER — Ambulatory Visit (INDEPENDENT_AMBULATORY_CARE_PROVIDER_SITE_OTHER): Payer: 59 | Admitting: Family Medicine

## 2014-10-25 VITALS — BP 130/70 | HR 91 | Temp 97.8°F | Wt 283.0 lb

## 2014-10-25 DIAGNOSIS — M5416 Radiculopathy, lumbar region: Secondary | ICD-10-CM

## 2014-10-25 MED ORDER — HYDROCODONE-ACETAMINOPHEN 5-325 MG PO TABS: 1.0000 | ORAL_TABLET | Freq: Four times a day (QID) | ORAL | Status: DC | PRN

## 2014-10-25 MED ORDER — PREDNISONE 10 MG PO TABS: ORAL_TABLET | ORAL | Status: DC

## 2014-10-25 NOTE — Progress Notes (Signed)
   Subjective:    Patient ID: Ashley Henry, female    DOB: [birth year], 39 y.o.   MRN: [id number]  HPI Patient here with left lumbar back pain. Intermittent pain since she gave birth several years ago. Current episode 5 weeks duration-and progressing in severity. She describes achy to throbbing quality pain up to 9 out of 10 severity lower left lumbar region with some radiation down below the knee at times. No weakness. Occasional tingling in the left thigh and leg. No loss of bladder or bowel control. No recent fevers or chills. No appetite or weight changes. No known injury. She notices that heat and sitting seem to exacerbate. She's tried Tylenol, aspirin, ibuprofen, and heat without relief. Also had muscle massage which did not help. No alleviating factors. No skin rashes. Her back pain is starting to negatively impact her sleep  Past Medical History  Diagnosis Date  . HYPERTENSION 09/07/2009  . Headache(784.0) 08/03/2010  . HYPERLIPIDEMIA 10/11/2009   Past Surgical History  Procedure Laterality Date  . Cesarean section  2004  . Breast reduction surgery  1999    reports that she has never smoked. She has never used smokeless tobacco. She reports that she does not drink alcohol or use illicit drugs. family history includes Diabetes in her mother; Hypertension in her father and mother; Lung cancer in her maternal grandfather; Prostate cancer in her maternal uncle; Stomach cancer in her maternal uncle and paternal grandmother; Thyroid disease in her maternal uncle. Allergies  Allergen Reactions  . Aspirin     REACTION: rash  . Collagenase     REACTION: hives      Review of Systems  Constitutional: Negative for fever, chills, appetite change and unexpected weight change.  Respiratory: Negative for shortness of breath.   Cardiovascular: Negative for chest pain.  Gastrointestinal: Negative for abdominal pain.  Genitourinary: Negative for dysuria.  Musculoskeletal: Positive for back  pain.  Skin: Negative for rash.  Neurological: Negative for weakness and numbness.  Hematological: Negative for adenopathy.       Objective:   Physical Exam  Constitutional: She appears well-developed and well-nourished.  Cardiovascular: Normal rate and regular rhythm.   Pulmonary/Chest: Effort normal and breath sounds normal. No respiratory distress. She has no wheezes. She has no rales.  Musculoskeletal: She exhibits no edema.  Straight leg raises are negative.  Neurological:  Full strength left lower extremity. She has trace to 1+ knee reflex bilaterally. 1+ ankle reflex bilaterally. Normal sensory function.  Skin: No rash noted.          Assessment & Plan:  Left lumbar radiculopathy and lumbar back pain. Intermittent for several years but worsening over the past several weeks. Set up physical therapy. Trial of prednisone taper and we reviewed possible side effects. Vicodin 5 mg 1-2 every 6 hours for severe pain #40 with no refill. Consider MRI scan if no improvement in 2-3 weeks. She has already tried multiple conservative things as above

## 2014-10-25 NOTE — Patient Instructions (Addendum)
Lumbosacral Radiculopathy Lumbosacral radiculopathy is a pinched nerve or nerves in the low back (lumbosacral area). When this happens you may have weakness in your legs and may not be able to stand on your toes. You may have pain going down into your legs. There may be difficulties with walking normally. There are many causes of this problem. Sometimes this may happen from an injury, or simply from arthritis or boney problems. It may also be caused by other illnesses such as diabetes. If there is no improvement after treatment, further studies may be done to find the exact cause. DIAGNOSIS  X-rays may be needed if the problems become long standing. Electromyograms may be done. This study is one in which the working of nerves and muscles is studied. HOME CARE INSTRUCTIONS   Applications of ice packs may be helpful. Ice can be used in a plastic bag with a towel around it to prevent frostbite to skin. This may be used every 2 hours for 20 to 30 minutes, or as needed, while awake, or as directed by your caregiver.  Only take over-the-counter or prescription medicines for pain, discomfort, or fever as directed by your caregiver.  If physical therapy was prescribed, follow your caregiver's directions. SEEK IMMEDIATE MEDICAL CARE IF:   You have pain not controlled with medications.  You seem to be getting worse rather than better.  You develop increasing weakness in your legs.  You develop loss of bowel or bladder control.  You have difficulty with walking or balance, or develop clumsiness in the use of your legs.  You have a fever. MAKE SURE YOU:   Understand these instructions.  Will watch your condition.  Will get help right away if you are not doing well or get worse. Document Released: 09/02/2005 Document Revised: 11/25/2011 Document Reviewed: 04/22/2008 [facility] Patient Information 2015 [place], [place]. This information is not intended to replace advice given to you by your health  care provider. Make sure you discuss any questions you have with your health care provider.  Touch base with me in 2-3 weeks if no better.

## 2014-10-25 NOTE — Progress Notes (Signed)
Pre visit review using our clinic review tool, if applicable. No additional management support is needed unless otherwise documented below in the visit note. 

## 2014-11-14 ENCOUNTER — Ambulatory Visit: Payer: 59 | Attending: Family Medicine

## 2014-11-14 DIAGNOSIS — R6889 Other general symptoms and signs: Secondary | ICD-10-CM | POA: Diagnosis not present

## 2014-11-14 DIAGNOSIS — M25659 Stiffness of unspecified hip, not elsewhere classified: Secondary | ICD-10-CM | POA: Diagnosis not present

## 2014-11-14 DIAGNOSIS — E669 Obesity, unspecified: Secondary | ICD-10-CM | POA: Insufficient documentation

## 2014-11-14 DIAGNOSIS — I10 Essential (primary) hypertension: Secondary | ICD-10-CM | POA: Diagnosis not present

## 2014-11-14 DIAGNOSIS — E785 Hyperlipidemia, unspecified: Secondary | ICD-10-CM | POA: Insufficient documentation

## 2014-11-14 DIAGNOSIS — M5442 Lumbago with sciatica, left side: Secondary | ICD-10-CM | POA: Diagnosis present

## 2014-11-14 NOTE — Therapy (Addendum)
[facility] [address], [place], [place], [address] Phone: [phone]   Fax:  [phone]  Physical Therapy Treatment  Patient Details  Name: Ashley Henry MRN: [id number] Date of Birth: 14-Jul-1976 Referring Provider:  Eulas Post, MD  Encounter Date: 11/14/2014      PT End of Session - 11/14/14 1254    Visit Number 1   Date for PT Re-Evaluation 01/06/15   PT Start Time 1217   PT Stop Time 1301   PT Time Calculation (min) 44 min   Activity Tolerance Patient tolerated treatment well   Behavior During Therapy [facility] for tasks assessed/performed      Past Medical History  Diagnosis Date  . HYPERTENSION 09/07/2009  . Headache(784.0) 08/03/2010  . HYPERLIPIDEMIA 10/11/2009    Past Surgical History  Procedure Laterality Date  . Cesarean section  2004  . Breast reduction surgery  1999    There were no vitals taken for this visit.  Visit Diagnosis:  Left-sided low back pain with left-sided sciatica - Plan: PT plan of care cert/re-cert  Activity intolerance - Plan: PT plan of care cert/re-cert  Stiffness of joint, pelvic region and thigh, unspecified laterality - Plan: PT plan of care cert/re-cert      Subjective Assessment - 11/14/14 1220    Symptoms Pt presents to PT wtih Lt sided lumbar pain with occasional radiculopathy to the Lt LE.     Pertinent History No known cause or incident that precipitated the pain.   Limitations Sitting;House hold activities  lifting groceries   How long can you sit comfortably? <5 minutes before pain begins, 15 minutes max with use of pillow and frequent rest breaks.     Diagnostic tests no x-ray or MRI   Patient Stated Goals sit longer, reduced pain  and ease with negotiating steps, less pain with lifting groceries   Currently in Pain? Yes   Pain Score 8   3-9/10 range over the past week   Pain Location Back   Pain Orientation Lower;Left   Pain Descriptors /  Indicators Sharp;Constant;Pressure   Pain Type Chronic pain   Pain Radiating Towards Lt LE-to the Lt knee and then to the ankle   Pain Onset More than a month ago   Pain Frequency Constant   Aggravating Factors  sitting, lifting groceries/housework, negotiaing steps   Pain Relieving Factors pillow behind the back, rub the back   Multiple Pain Sites No          [facility] PT Assessment - 11/14/14 0001    Assessment   Medical Diagnosis Lt lumbar radiculopathy-M54.16   Onset Date 09/16/14   Next MD Visit none scheduled   Precautions   Precautions None   Restrictions   Weight Bearing Restrictions No   Balance Screen   Has the patient fallen in the past 6 months No   Has the patient had a decrease in activity level because of a fear of falling?  No   Is the patient reluctant to leave their home because of a fear of falling?  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, phone, computer   Cognition   Overall Cognitive Status Within Functional Limits for tasks assessed   Observation/Other Assessments   Focus on Therapeutic Outcomes (FOTO)  67% limitation   Posture/Postural Control   Posture/Postural Control No significant limitations   ROM / Strength   AROM / PROM / Strength Strength;AROM;PROM   AROM   Overall AROM  Within functional limits for tasks performed   Overall AROM Comments Full lumbar AROM in all drections with pain at Lt SI joint with flexion and Lt sidebending   AROM Assessment Site Lumbar   PROM   Overall PROM  Deficits   Overall PROM Comments Pt with hamstring and IR limitations by 50% with Lt SI joint pain with end range on the  Lt.   PROM Assessment Site Hip   Strength   Overall Strength Within functional limits for tasks performed   Overall Strength Comments 5/5 LE strength Rt=Lt   Palpation   Palpation Pt with palpable tenderness over Lt SI joint and deep gluteals.  Pain with PA glides L3-5 and reduced mobility   Special Tests    Special Tests Lumbar   Slump test   Findings Negative   Side Left   Straight Leg Raise   Findings Negative   Side  Left   Ambulation/Gait   Ambulation/Gait Yes   Ambulation/Gait Assistance 7: Independent                  [facility] Adult PT Treatment/Exercise - 11/14/14 0001    Exercises   Exercises Lumbar;Knee/Hip   Lumbar Exercises: Stretches   Active Hamstring Stretch 3 reps;20 seconds   Single Knee to Chest Stretch 3 reps;20 seconds   Lower Trunk Rotation 3 reps;20 seconds   Piriformis Stretch 3 reps;20 seconds  performed sitting   Modalities   Modalities Moist Heat   Moist Heat Therapy   Number Minutes Moist Heat 12 Minutes   Moist Heat Location Other (comment)                PT Education - 11/14/14 1245    Education provided Yes   Education Details HEP   Person(s) Educated Patient   Methods Explanation;Verbal cues;Handout;Tactile cues;Demonstration   Comprehension Verbalized understanding;Returned demonstration          PT Short Term Goals - 11/14/14 1257    PT SHORT TERM GOAL #1   Title be independent in initial HEP   Time 4   Period Weeks   Status New   PT SHORT TERM GOAL #2   Title report < or = to 5/10 LBP and Lt LE pain with sitting at work   Time 4   Period Weeks   Status New   PT SHORT TERM GOAL #3   Title report a 25% reduction in Lt LE radiculopathy with home and work tasks4   Period Weeks   Status New           PT Long Term Goals - 11/14/14 1216    PT LONG TERM GOAL #1   Title be independent in advanced HEP   Time 8   Period Weeks   Status New   PT LONG TERM GOAL #2   Title reduce FOTO to < or = to 42%  limitation   Time 8   Period Weeks   Status New   PT LONG TERM GOAL #3   Title report < or = to 4/10 LBP and Lt LE pain with sitting at  work   Time 8   Period Weeks   Status New   PT LONG TERM GOAL #4   Title negotiate steps with 50% increased ease   Time 8   Period Weeks   Status New   PT LONG TERM GOAL #5   Title lift groceries without increased LBP or Lt LE pain   Time 8   Period Weeks   Status New               Plan - 11/14/14 1254    Clinical Impression Statement Pt is a 39 y.o. female who presents with Lt LBP and LE radiculopathy.  Pt is limited in sitting especially at work.  Pt demonstrates hip stiffness/tension.     Pt will benefit from skilled therapeutic intervention in order to improve on the following deficits Decreased range of motion;Improper body mechanics;Decreased endurance;Decreased activity tolerance;Pain   Rehab Potential Good   PT Frequency 2x / week   PT Duration 8 weeks   PT Treatment/Interventions ADLs/Self Care Home Management;Moist Heat;Therapeutic activities;Patient/family education;Therapeutic exercise;Manual techniques;Ultrasound;Cryotherapy;Electrical Stimulation;Neuromuscular re-education   PT Next Visit Plan Body mechanics education, hip and lumbar flexibility, core stability, modalities   PT Home Exercise Plan Core strength   Consulted and Agree with Plan of Care Patient        Problem List Patient Active Problem List   Diagnosis Date Noted  . Iron deficiency anemia due to chronic blood loss 06/20/2014  . Enlarged uterus 05/13/2011  . PARESTHESIA 08/03/2010  . HEADACHE 08/03/2010  . BACK PAIN, UPPER 07/27/2010  . BRONCHITIS, ACUTE 07/16/2010  . SORE THROAT 02/14/2010  . INGROWN NAIL 02/08/2010  . CELLULITIS, GREAT TOE 01/08/2010  . SINUSITIS, ACUTE 12/07/2009  . HYPERLIPIDEMIA 10/11/2009  . OBESITY 10/11/2009  . HYPERTENSION 09/07/2009    TAKACS,KELLY, PT 11/14/2014, 1:06 PM PHYSICAL THERAPY DISCHARGE SUMMARY  Visits  from Start of Care: 1  Current functional level related to goals / functional outcomes: See evaluation.  Pt cancelled all appointments after the evaluation and reported that she was feeling much better.     Remaining deficits: Unknown as pt didn't return.  Goals not assessed due to not returning to PT.     Education / Equipment: HEP Plan: Patient agrees to discharge.  Patient goals were not met. Patient is being discharged due to the patient's request.  ?????   Sigurd Sos, PT 11/30/2014 8:29 AM  [facility] [address], [place], [place], [address] Phone: [phone]   Fax:  [phone]

## 2014-11-14 NOTE — Patient Instructions (Signed)
Knee to Chest    Lying supine, bend involved knee to chest.  Hold 20 seconds and repeat _3__ times. Repeat with other leg. Do __3_ times per day.  Copyright  VHI. All rights reserved.  Double Knee to Chest (Flexion)  Lower Trunk Rotation  FEET ON THE BED OR FLOOR Bring both knees in to chest. Rotate from side to side, keeping knees together and feet off floor. Hold 20 seconds Repeat __3__ times per set. Do __1_ sets per session. Do _3___ sessions per day.  http://orth.exer.us/151   Copyright  VHI. All rights reserved.  Piriformis Stretch, Sitting   Sit, one ankle on opposite knee, same-side hand on crossed knee. Push down on knee, keeping spine straight. Lean torso forward, with flat back, until tension is felt in hamstrings and gluteals of crossed-leg side. Hold _20__ seconds.  Repeat _3__ times per session. Do _3__ sessions per day.  Copyright  VHI. All rights reserved.  HIP: Hamstrings - Short Sitting   Rest leg on raised surface. Keep knee straight. Lift chest. Hold _20__ seconds. _3__ reps per set, _3__ sets per day.  Copyright  VHI. All rights reserved.

## 2014-11-25 ENCOUNTER — Telehealth: Payer: Self-pay

## 2014-11-25 NOTE — Telephone Encounter (Signed)
Pt cancel remaining appointment, she states she's feeling better and dosen't  need PT any longer.

## 2014-11-28 ENCOUNTER — Ambulatory Visit: Payer: 59

## 2015-05-01 ENCOUNTER — Telehealth: Payer: Self-pay | Admitting: *Deleted

## 2015-05-01 MED ORDER — NORGESTIM-ETH ESTRAD TRIPHASIC 0.18/0.215/0.25 MG-25 MCG PO TABS: 1.0000 | ORAL_TABLET | Freq: Every day | ORAL | Status: DC

## 2015-05-01 NOTE — Telephone Encounter (Signed)
Pt called requesting refill on birth control pills, has annual scheduled on 05/31/15. Rx sent. Has new pharmacy.

## 2015-05-02 ENCOUNTER — Other Ambulatory Visit: Payer: Self-pay | Admitting: Gynecology

## 2015-05-31 ENCOUNTER — Ambulatory Visit (INDEPENDENT_AMBULATORY_CARE_PROVIDER_SITE_OTHER): Payer: 59 | Admitting: Gynecology

## 2015-05-31 ENCOUNTER — Encounter: Payer: Self-pay | Admitting: Gynecology

## 2015-05-31 VITALS — BP 140/90

## 2015-05-31 DIAGNOSIS — E663 Overweight: Secondary | ICD-10-CM

## 2015-05-31 DIAGNOSIS — Z01419 Encounter for gynecological examination (general) (routine) without abnormal findings: Secondary | ICD-10-CM | POA: Diagnosis not present

## 2015-05-31 DIAGNOSIS — N942 Vaginismus: Secondary | ICD-10-CM | POA: Diagnosis not present

## 2015-05-31 DIAGNOSIS — D500 Iron deficiency anemia secondary to blood loss (chronic): Secondary | ICD-10-CM

## 2015-05-31 MED ORDER — NORGESTIM-ETH ESTRAD TRIPHASIC 0.18/0.215/0.25 MG-25 MCG PO TABS: 1.0000 | ORAL_TABLET | Freq: Every day | ORAL | Status: DC

## 2015-05-31 NOTE — Progress Notes (Signed)
Ashley Henry 03/24/1976 [id number]   History:    39 y.o.  for annual gyn exam with no complaints today. Due to the fact the patient is morbidly obese last year we did do doing ultrasound to better assess her uterus and ovaries. 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 She is on Ortho Tri-Cyclen Lo 28 pill contraception pill having normal menstrual cycles. Patient declined flu vaccine again this year. Patient reports no change in sexual partner. Patient with no previous history of any abnormal Pap smears.  Past medical history,surgical history, family history and social history were all reviewed and documented in the EPIC chart.  Gynecologic History Patient's last menstrual period was 05/03/2015. Contraception: OCP (estrogen/progesterone) Last Pap: 2015normal. Results were: normal Last mammogram: 2005. Results were: normal  Obstetric History OB History  Gravida Para Term Preterm AB SAB TAB Ectopic Multiple Living  2 1   1 1    1     # Outcome Date GA Lbr Len/2nd Weight Sex Delivery Anes PTL Lv  2 SAB           1 Para                ROS: A ROS was performed and pertinent positives and negatives are included in the history.  GENERAL: No fevers or chills. HEENT: No change in vision, no earache, sore throat or sinus congestion. NECK: No pain or stiffness. CARDIOVASCULAR: No chest pain or pressure. No palpitations. PULMONARY: No shortness of breath, cough or wheeze. GASTROINTESTINAL: No abdominal pain, nausea, vomiting or diarrhea, melena or bright red blood per rectum. GENITOURINARY: No urinary frequency, urgency, hesitancy or dysuria. MUSCULOSKELETAL: No joint or muscle pain, no back pain, no recent trauma. DERMATOLOGIC: No rash, no itching, no lesions. ENDOCRINE: No polyuria, polydipsia, no heat or cold intolerance. No recent change in weight. HEMATOLOGICAL: No anemia or easy bruising or bleeding. NEUROLOGIC: No headache, seizures, numbness, tingling  or weakness. PSYCHIATRIC: No depression, no loss of interest in normal activity or change in sleep pattern.     Exam: chaperone present  BP 140/90 mmHg  LMP 05/03/2015  There is no weight on file to calculate BMI.  General appearance : Well developed well nourished female. No acute distress HEENT: Eyes: no retinal hemorrhage or exudates,  Neck supple, trachea midline, no carotid bruits, no thyroidmegaly Lungs: Clear to auscultation, no rhonchi or wheezes, or rib retractions  Heart: Regular rate and rhythm, no murmurs or gallops Breast:Examined in sitting and supine position were symmetrical in appearance, no palpable masses or tenderness,  no skin retraction, no nipple inversion, no nipple discharge, no skin discoloration, no axillary or supraclavicular lymphadenopathy Abdomen: no palpable masses or tenderness, no rebound or guarding Extremities: no edema or skin discoloration or tenderness  Pelvic:  Bartholin, Urethra, Skene Glands: Within normal limits             Vagina: No vagina no lesions seen  Cervix: No gross lesions or discharge  Uterus  difficult to evaluate due to patient's vaginismus and narrow vagina  Adnexa  same as above  Anus and perineum  normal   Rectovaginal  normal sphincter tone without palpated masses or tenderness             Hemoccult not indicated     Assessment/Plan:  39 y.o. female for annual exam who is morbidly obese will return to the office next week for an ultrasound to better assess her uterus and ovaries  and adnexa because it was difficult due to patient's size and vaginismus. Also rechecking her blood pressure again since it was 140 overnight a today. Pap smear not indicated. When she come for ultrasound which is a fasting lab work consisting of the following: Comprehensive metabolic panel, fasting lipid profile, TSH, CBC, and urinalysis.   Terrance Mass MD, 2:03 PM 05/31/2015

## 2015-06-16 ENCOUNTER — Other Ambulatory Visit: Payer: 59

## 2015-06-16 ENCOUNTER — Ambulatory Visit: Payer: 59 | Admitting: Gynecology

## 2015-07-19 ENCOUNTER — Encounter: Payer: Self-pay | Admitting: Gynecology

## 2015-07-19 ENCOUNTER — Ambulatory Visit (INDEPENDENT_AMBULATORY_CARE_PROVIDER_SITE_OTHER): Payer: 59 | Admitting: Gynecology

## 2015-07-19 ENCOUNTER — Ambulatory Visit (INDEPENDENT_AMBULATORY_CARE_PROVIDER_SITE_OTHER): Payer: 59

## 2015-07-19 VITALS — BP 124/82

## 2015-07-19 DIAGNOSIS — Z01419 Encounter for gynecological examination (general) (routine) without abnormal findings: Secondary | ICD-10-CM | POA: Diagnosis not present

## 2015-07-19 DIAGNOSIS — D500 Iron deficiency anemia secondary to blood loss (chronic): Secondary | ICD-10-CM

## 2015-07-19 DIAGNOSIS — N942 Vaginismus: Secondary | ICD-10-CM | POA: Diagnosis not present

## 2015-07-19 DIAGNOSIS — E663 Overweight: Secondary | ICD-10-CM

## 2015-07-19 LAB — CBC WITH DIFFERENTIAL/PLATELET
Basophils Absolute: 0 10*3/uL (ref 0.0–0.1)
Basophils Relative: 0 % (ref 0–1)
Eosinophils Absolute: 0.2 10*3/uL (ref 0.0–0.7)
Eosinophils Relative: 2 % (ref 0–5)
HCT: 30.6 % — ABNORMAL LOW (ref 36.0–46.0)
Hemoglobin: 10 g/dL — ABNORMAL LOW (ref 12.0–15.0)
Lymphocytes Relative: 32 % (ref 12–46)
Lymphs Abs: 3.2 10*3/uL (ref 0.7–4.0)
MCH: 21.1 pg — ABNORMAL LOW (ref 26.0–34.0)
MCHC: 32.7 g/dL (ref 30.0–36.0)
MCV: 64.6 fL — ABNORMAL LOW (ref 78.0–100.0)
Monocytes Absolute: 0.5 10*3/uL (ref 0.1–1.0)
Monocytes Relative: 5 % (ref 3–12)
Neutro Abs: 6.2 10*3/uL (ref 1.7–7.7)
Neutrophils Relative %: 61 % (ref 43–77)
Platelets: 432 10*3/uL — ABNORMAL HIGH (ref 150–400)
RBC: 4.74 MIL/uL (ref 3.87–5.11)
RDW: 20 % — ABNORMAL HIGH (ref 11.5–15.5)
WBC: 10.1 10*3/uL (ref 4.0–10.5)

## 2015-07-19 LAB — TSH: TSH: 3.63 u[IU]/mL (ref 0.350–4.500)

## 2015-07-19 LAB — LIPID PANEL
Cholesterol: 183 mg/dL (ref 125–200)
HDL: 49 mg/dL (ref >=46)
LDL Cholesterol: 118 mg/dL (ref <130)
Total CHOL/HDL Ratio: 3.7 Ratio (ref <=5.0)
Triglycerides: 79 mg/dL (ref <150)
VLDL: 16 mg/dL (ref <30)

## 2015-07-19 LAB — COMPREHENSIVE METABOLIC PANEL
ALT: 9 U/L (ref 6–29)
AST: 9 U/L — ABNORMAL LOW (ref 10–30)
Albumin: 3.6 g/dL (ref 3.6–5.1)
Alkaline Phosphatase: 73 U/L (ref 33–115)
BUN: 15 mg/dL (ref 7–25)
CO2: 28 mmol/L (ref 20–31)
Calcium: 8.9 mg/dL (ref 8.6–10.2)
Chloride: 102 mmol/L (ref 98–110)
Creat: 0.9 mg/dL (ref 0.50–1.10)
Glucose, Bld: 112 mg/dL — ABNORMAL HIGH (ref 65–99)
Potassium: 4.4 mmol/L (ref 3.5–5.3)
Sodium: 138 mmol/L (ref 135–146)
Total Bilirubin: 0.2 mg/dL (ref 0.2–1.2)
Total Protein: 7.5 g/dL (ref 6.1–8.1)

## 2015-07-19 NOTE — Progress Notes (Signed)
   She is a 39 year old patient was seen the office for her annual exam on September 14. She is here for an ultrasound as well as for her fasting blood work since she was not fasting at time of her last annual exam visit. Due to patient's vaginismus and being overweight. She is on oral contraceptive pills having normal menstrual cycles and is a systematic today.  Ultrasound: Uterus measured 9.6 x 7.9 x 5.2 cm with endometrial stripe of 6.3 mm. Right and left ovary were normal. No fluid in the cul-de-sac. No apparent adnexal masses.  Assessment/plan: Normal ultrasound. Fasting lab work done today. Patient otherwise scheduled to return to the office in one year for annual exam or when necessary.

## 2015-07-20 LAB — URINALYSIS W MICROSCOPIC + REFLEX CULTURE
Bacteria, UA: NONE SEEN [HPF]
Bilirubin Urine: NEGATIVE
Casts: NONE SEEN [LPF]
Crystals: NONE SEEN [HPF]
Glucose, UA: NEGATIVE
Hgb urine dipstick: NEGATIVE
Ketones, ur: NEGATIVE
Leukocytes, UA: NEGATIVE
Nitrite: NEGATIVE
RBC / HPF: NONE SEEN RBC/HPF (ref <=2)
Specific Gravity, Urine: 1.03 (ref 1.001–1.035)
WBC, UA: NONE SEEN WBC/HPF (ref <=5)
Yeast: NONE SEEN [HPF]
pH: 6 (ref 5.0–8.0)

## 2015-07-24 ENCOUNTER — Other Ambulatory Visit: Payer: Self-pay | Admitting: Gynecology

## 2015-07-24 DIAGNOSIS — R809 Proteinuria, unspecified: Secondary | ICD-10-CM

## 2015-07-24 DIAGNOSIS — D62 Acute posthemorrhagic anemia: Secondary | ICD-10-CM

## 2015-07-25 ENCOUNTER — Other Ambulatory Visit: Payer: Self-pay | Admitting: Gynecology

## 2015-07-25 ENCOUNTER — Encounter: Payer: Self-pay | Admitting: Gastroenterology

## 2015-07-25 ENCOUNTER — Telehealth: Payer: Self-pay | Admitting: *Deleted

## 2015-07-25 DIAGNOSIS — R7309 Other abnormal glucose: Secondary | ICD-10-CM

## 2015-07-25 DIAGNOSIS — D649 Anemia, unspecified: Secondary | ICD-10-CM

## 2015-07-25 NOTE — Telephone Encounter (Signed)
-----   Message from Ramond Craver, [place] sent at 07/24/2015  4:11 PM EST ----- Regarding: referral to GI Per Dr. Moshe Salisbury "Please inform patient that she continues to have chronic anemia her hemoglobin is 10.0 last year was 9.7. I understand that she is on the oral contraceptive pill and has reported normal menstrual cycles. I would like for her to see the gastroenterologist for further evaluation."  Patient knows you will be scheduling. Thanks!

## 2015-07-25 NOTE — Telephone Encounter (Signed)
Appointment on 08/25/15 @ 8:30am with Dr.Nandigam pt aware.

## 2015-07-27 ENCOUNTER — Other Ambulatory Visit: Payer: 59

## 2015-07-27 DIAGNOSIS — D62 Acute posthemorrhagic anemia: Secondary | ICD-10-CM

## 2015-07-27 DIAGNOSIS — R7309 Other abnormal glucose: Secondary | ICD-10-CM

## 2015-07-27 DIAGNOSIS — R809 Proteinuria, unspecified: Secondary | ICD-10-CM

## 2015-07-27 LAB — CBC WITH DIFFERENTIAL/PLATELET
Basophils Absolute: 0.1 10*3/uL (ref 0.0–0.1)
Basophils Relative: 1 % (ref 0–1)
Eosinophils Absolute: 0.3 10*3/uL (ref 0.0–0.7)
Eosinophils Relative: 3 % (ref 0–5)
HCT: 29.5 % — ABNORMAL LOW (ref 36.0–46.0)
Hemoglobin: 9.6 g/dL — ABNORMAL LOW (ref 12.0–15.0)
Lymphocytes Relative: 33 % (ref 12–46)
Lymphs Abs: 3 10*3/uL (ref 0.7–4.0)
MCH: 20.6 pg — ABNORMAL LOW (ref 26.0–34.0)
MCHC: 32.5 g/dL (ref 30.0–36.0)
MCV: 63.3 fL — ABNORMAL LOW (ref 78.0–100.0)
Monocytes Absolute: 0.5 10*3/uL (ref 0.1–1.0)
Monocytes Relative: 5 % (ref 3–12)
Neutro Abs: 5.2 10*3/uL (ref 1.7–7.7)
Neutrophils Relative %: 58 % (ref 43–77)
Platelets: 421 10*3/uL — ABNORMAL HIGH (ref 150–400)
RBC: 4.66 MIL/uL (ref 3.87–5.11)
RDW: 19.8 % — ABNORMAL HIGH (ref 11.5–15.5)
WBC: 9 10*3/uL (ref 4.0–10.5)

## 2015-07-27 LAB — HEMOGLOBIN A1C
Hgb A1c MFr Bld: 6.2 % — ABNORMAL HIGH (ref <5.7)
Mean Plasma Glucose: 131 mg/dL — ABNORMAL HIGH (ref <117)

## 2015-07-28 ENCOUNTER — Encounter: Payer: Self-pay | Admitting: Gynecology

## 2015-07-28 LAB — URINALYSIS W MICROSCOPIC + REFLEX CULTURE
Bacteria, UA: NONE SEEN [HPF]
Bilirubin Urine: NEGATIVE
Casts: NONE SEEN [LPF]
Crystals: NONE SEEN [HPF]
Glucose, UA: NEGATIVE
Hgb urine dipstick: NEGATIVE
Ketones, ur: NEGATIVE
Leukocytes, UA: NEGATIVE
Nitrite: NEGATIVE
RBC / HPF: NONE SEEN RBC/HPF (ref <=2)
Specific Gravity, Urine: 1.022 (ref 1.001–1.035)
Squamous Epithelial / LPF: NONE SEEN [HPF] (ref <=5)
WBC, UA: NONE SEEN WBC/HPF (ref <=5)
Yeast: NONE SEEN [HPF]
pH: 6 (ref 5.0–8.0)

## 2015-08-01 NOTE — Progress Notes (Signed)
I want a hemoglobin A1c fasting as I had requested

## 2015-08-03 ENCOUNTER — Other Ambulatory Visit: Payer: Self-pay | Admitting: Gynecology

## 2015-08-03 DIAGNOSIS — D500 Iron deficiency anemia secondary to blood loss (chronic): Secondary | ICD-10-CM

## 2015-08-25 ENCOUNTER — Ambulatory Visit: Payer: 59 | Admitting: Gastroenterology

## 2015-10-14 ENCOUNTER — Encounter (HOSPITAL_BASED_OUTPATIENT_CLINIC_OR_DEPARTMENT_OTHER): Payer: Self-pay | Admitting: *Deleted

## 2015-10-14 ENCOUNTER — Emergency Department (HOSPITAL_BASED_OUTPATIENT_CLINIC_OR_DEPARTMENT_OTHER)
Admission: EM | Admit: 2015-10-14 | Discharge: 2015-10-15 | Disposition: A | Discharge status: Home / Self Care | Payer: BLUE CROSS/BLUE SHIELD | Attending: Emergency Medicine | Admitting: Emergency Medicine

## 2015-10-14 DIAGNOSIS — N200 Calculus of kidney: Secondary | ICD-10-CM | POA: Diagnosis not present

## 2015-10-14 DIAGNOSIS — Z8639 Personal history of other endocrine, nutritional and metabolic disease: Secondary | ICD-10-CM | POA: Insufficient documentation

## 2015-10-14 DIAGNOSIS — Z79899 Other long term (current) drug therapy: Secondary | ICD-10-CM | POA: Insufficient documentation

## 2015-10-14 DIAGNOSIS — R109 Unspecified abdominal pain: Secondary | ICD-10-CM

## 2015-10-14 DIAGNOSIS — I10 Essential (primary) hypertension: Secondary | ICD-10-CM | POA: Diagnosis not present

## 2015-10-14 DIAGNOSIS — Z3202 Encounter for pregnancy test, result negative: Secondary | ICD-10-CM | POA: Diagnosis not present

## 2015-10-14 DIAGNOSIS — Z862 Personal history of diseases of the blood and blood-forming organs and certain disorders involving the immune mechanism: Secondary | ICD-10-CM | POA: Diagnosis not present

## 2015-10-14 NOTE — ED Notes (Signed)
Patient is alert and oriented x4.  She is complaining of right side flank pain that has been on going  Since Tuesday.  Patient denies any history of this issue before.  Currently she rates her pain 10 of 10  With nausea.

## 2015-10-15 ENCOUNTER — Encounter (HOSPITAL_BASED_OUTPATIENT_CLINIC_OR_DEPARTMENT_OTHER): Payer: Self-pay | Admitting: Emergency Medicine

## 2015-10-15 ENCOUNTER — Emergency Department (HOSPITAL_BASED_OUTPATIENT_CLINIC_OR_DEPARTMENT_OTHER): Payer: BLUE CROSS/BLUE SHIELD

## 2015-10-15 LAB — URINE MICROSCOPIC-ADD ON

## 2015-10-15 LAB — URINALYSIS, ROUTINE W REFLEX MICROSCOPIC
Bilirubin Urine: NEGATIVE
Glucose, UA: NEGATIVE mg/dL
Ketones, ur: NEGATIVE mg/dL
Leukocytes, UA: NEGATIVE
Nitrite: NEGATIVE
Protein, ur: 30 mg/dL — AB
Specific Gravity, Urine: 1.019 (ref 1.005–1.030)
pH: 6.5 (ref 5.0–8.0)

## 2015-10-15 LAB — CBC WITH DIFFERENTIAL/PLATELET
Basophils Absolute: 0 10*3/uL (ref 0.0–0.1)
Basophils Relative: 0 %
Eosinophils Absolute: 0.2 10*3/uL (ref 0.0–0.7)
Eosinophils Relative: 1 %
HCT: 29.3 % — ABNORMAL LOW (ref 36.0–46.0)
Hemoglobin: 9.7 g/dL — ABNORMAL LOW (ref 12.0–15.0)
Lymphocytes Relative: 18 %
Lymphs Abs: 3 10*3/uL (ref 0.7–4.0)
MCH: 20.6 pg — ABNORMAL LOW (ref 26.0–34.0)
MCHC: 33.1 g/dL (ref 30.0–36.0)
MCV: 62.3 fL — ABNORMAL LOW (ref 78.0–100.0)
Monocytes Absolute: 0.8 10*3/uL (ref 0.1–1.0)
Monocytes Relative: 5 %
Neutro Abs: 12.6 10*3/uL — ABNORMAL HIGH (ref 1.7–7.7)
Neutrophils Relative %: 76 %
Platelets: 441 10*3/uL — ABNORMAL HIGH (ref 150–400)
RBC: 4.7 MIL/uL (ref 3.87–5.11)
RDW: 18.8 % — ABNORMAL HIGH (ref 11.5–15.5)
WBC: 16.6 10*3/uL — ABNORMAL HIGH (ref 4.0–10.5)

## 2015-10-15 LAB — BASIC METABOLIC PANEL
Anion gap: 9 (ref 5–15)
BUN: 17 mg/dL (ref 6–20)
CO2: 28 mmol/L (ref 22–32)
Calcium: 8.9 mg/dL (ref 8.9–10.3)
Chloride: 102 mmol/L (ref 101–111)
Creatinine, Ser: 1.3 mg/dL — ABNORMAL HIGH (ref 0.44–1.00)
GFR calc Af Amer: 60 mL/min — ABNORMAL LOW (ref >60)
GFR calc non Af Amer: 52 mL/min — ABNORMAL LOW (ref >60)
Glucose, Bld: 132 mg/dL — ABNORMAL HIGH (ref 65–99)
Potassium: 3.9 mmol/L (ref 3.5–5.1)
Sodium: 139 mmol/L (ref 135–145)

## 2015-10-15 LAB — PREGNANCY, URINE: Preg Test, Ur: NEGATIVE

## 2015-10-15 MED ORDER — OXYCODONE-ACETAMINOPHEN 5-325 MG PO TABS
2.0000 | ORAL_TABLET | Freq: Once | ORAL | Status: AC
  Administered 2015-10-15: 2 via ORAL
  Filled 2015-10-15: qty 2

## 2015-10-15 MED ORDER — TAMSULOSIN HCL 0.4 MG PO CAPS: 0.4000 mg | ORAL_CAPSULE | Freq: Every day | ORAL | Status: DC

## 2015-10-15 MED ORDER — ONDANSETRON 8 MG PO TBDP: 8.0000 mg | ORAL_TABLET | Freq: Three times a day (TID) | ORAL | Status: DC | PRN

## 2015-10-15 MED ORDER — TAMSULOSIN HCL 0.4 MG PO CAPS
0.4000 mg | ORAL_CAPSULE | Freq: Every day | ORAL | Status: DC
  Administered 2015-10-15: 0.4 mg via ORAL
  Filled 2015-10-15: qty 1

## 2015-10-15 MED ORDER — ONDANSETRON HCL 4 MG/2ML IJ SOLN
4.0000 mg | Freq: Once | INTRAMUSCULAR | Status: AC
  Administered 2015-10-15: 4 mg via INTRAVENOUS
  Filled 2015-10-15: qty 2

## 2015-10-15 MED ORDER — KETOROLAC TROMETHAMINE 30 MG/ML IJ SOLN
30.0000 mg | Freq: Once | INTRAMUSCULAR | Status: AC
  Administered 2015-10-15: 30 mg via INTRAVENOUS
  Filled 2015-10-15: qty 1

## 2015-10-15 MED ORDER — HYDROCODONE-ACETAMINOPHEN 5-325 MG PO TABS: 1.0000 | ORAL_TABLET | Freq: Four times a day (QID) | ORAL | Status: DC | PRN

## 2015-10-15 NOTE — ED Provider Notes (Signed)
CSN: [id number]     Arrival date & time 10/14/15  2337 History  By signing my name below, I, [name], attest that this documentation has been prepared under the direction and in the presence of Shoichi Mielke, MD. Electronically Signed: [name], ED Scribe. 10/15/2015. 12:38 AM   Chief Complaint  Patient presents with  . Flank Pain    Patient is a 40 y.o. female presenting with flank pain. The history is provided by the patient. No language interpreter was used.  Flank Pain This is a new problem. The current episode started more than 2 days ago. The problem occurs constantly. The problem has been gradually worsening. Associated symptoms include abdominal pain. Pertinent negatives include no chest pain, no headaches and no shortness of breath. Nothing aggravates the symptoms. The symptoms are relieved by position. Treatments tried: An OTC pain reliever. The treatment provided no relief.   Ashley Henry is a 40 y.o. female who presents to the Emergency Department complaining of new, constant, pounding, 10/10 in severity, right flank pain with onset 4 days ago. The pain occasionally radiates to the right and mid abdomen. An OTC pain reliever, last dose near 8 PM, provided insufficient pain relief at home. The pain is mildly improved with with bending over a pillow on the knees.  Pt denies dysuria, increased frequency, stream difficulty, vaginal discharge or bleeding, vomiting, diarrhea, constipation. She denies trauma or recent heavy lifting.   Past Medical History  Diagnosis Date  . HYPERTENSION 09/07/2009  . Headache(784.0) 08/03/2010  . HYPERLIPIDEMIA 10/11/2009  . Anemia    Past Surgical History  Procedure Laterality Date  . Cesarean section  2004  . Breast reduction surgery  1999   Family History  Problem Relation Age of Onset  . Hypertension Mother   . Diabetes Mother   . Hypertension Father   . Stomach cancer Paternal Grandmother   . Stomach cancer Maternal Uncle    . Lung cancer Maternal Grandfather   . Prostate cancer Maternal Uncle   . Thyroid disease Maternal Uncle    Social History  Substance Use Topics  . Smoking status: Never Smoker   . Smokeless tobacco: Never Used  . Alcohol Use: No   OB History    Gravida Para Term Preterm AB TAB SAB Ectopic Multiple Living   2 1   1  1   1      Review of Systems  Respiratory: Negative for shortness of breath.   Cardiovascular: Negative for chest pain.  Gastrointestinal: Positive for abdominal pain. Negative for nausea, vomiting, diarrhea and constipation.  Genitourinary: Positive for flank pain and difficulty urinating. Negative for dysuria, frequency, vaginal bleeding and vaginal discharge.  Neurological: Negative for headaches.  All other systems reviewed and are negative.  Allergies  Aspirin and Collagenase  Home Medications   Prior to Admission medications   Medication Sig Start Date End Date Taking? Authorizing Provider  Norgestimate-Ethinyl Estradiol Triphasic (ORTHO TRI-CYCLEN LO) 0.18/0.215/0.25 MG-25 MCG tab Take 1 tablet by mouth daily. 05/31/15   Terrance Mass, MD   BP 185/96 mmHg  Pulse 95  Temp(Src) 99.2 F (37.3 C) (Oral)  Resp 20  SpO2 99%  LMP 09/13/2015 Physical Exam  Constitutional: She is oriented to person, place, and time. She appears well-developed and well-nourished. No distress.  HENT:  Head: Normocephalic.  Mouth/Throat: Oropharynx is clear and moist.  Moist mucous membranes No exudate  Eyes: EOM are normal. Pupils are equal, round, and reactive to light.  Neck: Normal  range of motion.  Trachea midline No bruit  Cardiovascular: Normal rate and regular rhythm.   Pulmonary/Chest: Effort normal and breath sounds normal. No stridor. She has no wheezes. She has no rales.  Abdominal: Soft. Bowel sounds are normal. She exhibits no mass. There is no tenderness. There is no rebound and no guarding.  Musculoskeletal: Normal range of motion.  Right paraspinal  spasm  Neurological: She is alert and oriented to person, place, and time.  Skin: Skin is warm and dry.  Psychiatric: She has a normal mood and affect. Her behavior is normal.  Nursing note and vitals reviewed.   ED Course  Procedures (including critical care time) DIAGNOSTIC STUDIES: Oxygen Saturation is 99% on RA,  normal by my interpretation.    COORDINATION OF CARE: 12:21 AM Discussed treatment plan which includes lab work, CT renal stone, and Toradol with pt at bedside and pt agreed to plan.  Labs Review Labs Reviewed  URINALYSIS, ROUTINE W REFLEX MICROSCOPIC (NOT AT [facility]) - Abnormal; Notable for the following:    APPearance CLOUDY (*)    Hgb urine dipstick SMALL (*)    Protein, ur 30 (*)    All other components within normal limits  URINE MICROSCOPIC-ADD ON - Abnormal; Notable for the following:    Squamous Epithelial / LPF 0-5 (*)    Bacteria, UA RARE (*)    All other components within normal limits  PREGNANCY, URINE  CBC WITH DIFFERENTIAL/PLATELET  BASIC METABOLIC PANEL    Imaging Review No results found. I have personally reviewed and evaluated these images and lab results as part of my medical decision-making.   EKG Interpretation None      MDM   Final diagnoses:  Flank pain   Medications  ketorolac (TORADOL) 30 MG/ML injection 30 mg (30 mg Intravenous Given 10/15/15 0045)  oxyCODONE-acetaminophen (PERCOCET/ROXICET) 5-325 MG per tablet 2 tablet (2 tablets Oral Given 10/15/15 0146)  ondansetron (ZOFRAN) injection 4 mg (4 mg Intravenous Given 10/15/15 0146)   Feels markedly improved post medication  Kidney stone: strain all urine.  Take zofran and then take pain medication.  Call to be seen by urology in 7 days.  Strict return given.  Patient verbalizes understanding and agrees to follow up  I personally performed the services described in this documentation, which was scribed in my presence. The recorded information has been reviewed and is accurate.       Veatrice Kells, MD 10/15/15 0730

## 2015-10-27 ENCOUNTER — Ambulatory Visit: Payer: BLUE CROSS/BLUE SHIELD | Admitting: Family Medicine

## 2015-10-30 ENCOUNTER — Ambulatory Visit (INDEPENDENT_AMBULATORY_CARE_PROVIDER_SITE_OTHER): Payer: BLUE CROSS/BLUE SHIELD | Admitting: Family Medicine

## 2015-10-30 ENCOUNTER — Encounter: Payer: Self-pay | Admitting: Family Medicine

## 2015-10-30 VITALS — BP 130/90 | HR 95 | Temp 99.1°F | Wt 280.0 lb

## 2015-10-30 DIAGNOSIS — N200 Calculus of kidney: Secondary | ICD-10-CM | POA: Diagnosis not present

## 2015-10-30 HISTORY — DX: Calculus of kidney: N20.0

## 2015-10-30 NOTE — Progress Notes (Signed)
Pre visit review using our clinic review tool, if applicable. No additional management support is needed unless otherwise documented below in the visit note. 

## 2015-10-30 NOTE — Progress Notes (Signed)
   Subjective:    Patient ID: Ashley Henry, female    DOB: 19-Jun-1976, 40 y.o.   MRN: [id number]  HPI Patient seen for ER follow-up. She developed severe right flank pain end of January and had had pain for about 4 days before presenting to the emergency department. She described 10 out of 10 severity pain right flank on presentation to emergency department. CT scan revealed 6 mm stone right distal ureter along with the stone left kidney without hydronephrosis. Patient was given pain medication and a strainer but she never did recover a stone. After a day or so her pain fully subsided and she's had no recurrence since then. She denied any burning with urination or any fevers or chills. Generally stays well-hydrated. She has follow-up with urology already scheduled for in March. No family history of stones.  ER notes are reviewed today Patient has never had prior hx of kidney stones.  Past Medical History  Diagnosis Date  . HYPERTENSION 09/07/2009  . Headache(784.0) 08/03/2010  . HYPERLIPIDEMIA 10/11/2009  . Anemia    Past Surgical History  Procedure Laterality Date  . Cesarean section  2004  . Breast reduction surgery  1999    reports that she has never smoked. She has never used smokeless tobacco. She reports that she does not drink alcohol or use illicit drugs. family history includes Diabetes in her mother; Hypertension in her father and mother; Lung cancer in her maternal grandfather; Prostate cancer in her maternal uncle; Stomach cancer in her maternal uncle and paternal grandmother; Thyroid disease in her maternal uncle. Allergies  Allergen Reactions  . Aspirin     REACTION: rash  . Collagenase     REACTION: hives      Review of Systems  Constitutional: Negative for fever and chills.  Respiratory: Negative for shortness of breath.   Cardiovascular: Negative for chest pain.  Gastrointestinal: Negative for nausea, vomiting and abdominal pain.  Genitourinary: Negative for  dysuria, frequency, hematuria and flank pain.  Neurological: Negative for dizziness.       Objective:   Physical Exam  Constitutional: She appears well-developed and well-nourished.  Cardiovascular: Normal rate and regular rhythm.   Pulmonary/Chest: Effort normal and breath sounds normal. No respiratory distress. She has no wheezes. She has no rales.  Musculoskeletal: She exhibits no edema.          Assessment & Plan:  Kidney stone. This is a first occurrence. Patient is totally asymptomatic and very likely passed 6 mm stone which was noted left distal ureter. We discussed stone prevention especially increased hydration and dietary factors. She already has follow-up with urology scheduled. Touch base for any recurrent symptoms

## 2015-10-30 NOTE — Patient Instructions (Signed)
Kidney Stones Kidney stones (urolithiasis) are deposits that form inside your kidneys. The intense pain is caused by the stone moving through the urinary tract. When the stone moves, the ureter goes into spasm around the stone. The stone is usually passed in the urine.  CAUSES   A disorder that makes certain neck glands produce too much parathyroid hormone (primary hyperparathyroidism).  A buildup of uric acid crystals, similar to gout in your joints.  Narrowing (stricture) of the ureter.  A kidney obstruction present at birth (congenital obstruction).  Previous surgery on the kidney or ureters.  Numerous kidney infections. SYMPTOMS   Feeling sick to your stomach (nauseous).  Throwing up (vomiting).  Blood in the urine (hematuria).  Pain that usually spreads (radiates) to the groin.  Frequency or urgency of urination. DIAGNOSIS   Taking a history and physical exam.  Blood or urine tests.  CT scan.  Occasionally, an examination of the inside of the urinary bladder (cystoscopy) is performed. TREATMENT   Observation.  Increasing your fluid intake.  Extracorporeal shock wave lithotripsy--This is a noninvasive procedure that uses shock waves to break up kidney stones.  Surgery may be needed if you have severe pain or persistent obstruction. There are various surgical procedures. Most of the procedures are performed with the use of small instruments. Only small incisions are needed to accommodate these instruments, so recovery time is minimized. The size, location, and chemical composition are all important variables that will determine the proper choice of action for you. Talk to your health care provider to better understand your situation so that you will minimize the risk of injury to yourself and your kidney.  HOME CARE INSTRUCTIONS   Drink enough water and fluids to keep your urine clear or pale yellow. This will help you to pass the stone or stone fragments.  Strain  all urine through the provided strainer. Keep all particulate matter and stones for your health care provider to see. The stone causing the pain may be as small as a grain of salt. It is very important to use the strainer each and every time you pass your urine. The collection of your stone will allow your health care provider to analyze it and verify that a stone has actually passed. The stone analysis will often identify what you can do to reduce the incidence of recurrences.  Only take over-the-counter or prescription medicines for pain, discomfort, or fever as directed by your health care provider.  Keep all follow-up visits as told by your health care provider. This is important.  Get follow-up X-rays if required. The absence of pain does not always mean that the stone has passed. It may have only stopped moving. If the urine remains completely obstructed, it can cause loss of kidney function or even complete destruction of the kidney. It is your responsibility to make sure X-rays and follow-ups are completed. Ultrasounds of the kidney can show blockages and the status of the kidney. Ultrasounds are not associated with any radiation and can be performed easily in a matter of minutes.  Make changes to your daily diet as told by your health care provider. You may be told to:  Limit the amount of salt that you eat.  Eat 5 or more servings of fruits and vegetables each day.  Limit the amount of meat, poultry, fish, and eggs that you eat.  Collect a 24-hour urine sample as told by your health care provider.You may need to collect another urine sample every 6-12   months. SEEK MEDICAL CARE IF:  You experience pain that is progressive and unresponsive to any pain medicine you have been prescribed. SEEK IMMEDIATE MEDICAL CARE IF:   Pain cannot be controlled with the prescribed medicine.  You have a fever or shaking chills.  The severity or intensity of pain increases over 18 hours and is not  relieved by pain medicine.  You develop a new onset of abdominal pain.  You feel faint or pass out.  You are unable to urinate.   This information is not intended to replace advice given to you by your health care provider. Make sure you discuss any questions you have with your health care provider.   Document Released: 09/02/2005 Document Revised: 05/24/2015 Document Reviewed: 02/03/2013 Elsevier Interactive Patient Education 2016 Else[employer] Dietary Guidelines to Help Prevent Kidney Stones Your risk of kidney stones can be decreased by adjusting the foods you eat. The most important thing you can do is drink enough fluid. You should drink enough fluid to keep your urine clear or pale yellow. The following guidelines provide specific information for the type of kidney stone you have had. GUIDELINES ACCORDING TO TYPE OF KIDNEY STONE Calcium Oxalate Kidney Stones  Reduce the amount of salt you eat. Foods that have a lot of salt cause your body to release excess calcium into your urine. The excess calcium can combine with a substance called oxalate to form kidney stones.  Reduce the amount of animal protein you eat if the amount you eat is excessive. Animal protein causes your body to release excess calcium into your urine. Ask your dietitian how much protein from animal sources you should be eating.  Avoid foods that are high in oxalates. If you take vitamins, they should have less than 500 mg of vitamin C. Your body turns vitamin C into oxalates. You do not need to avoid fruits and vegetables high in vitamin C. Calcium Phosphate Kidney Stones  Reduce the amount of salt you eat to help prevent the release of excess calcium into your urine.  Reduce the amount of animal protein you eat if the amount you eat is excessive. Animal protein causes your body to release excess calcium into your urine. Ask your dietitian how much protein from animal sources you should be eating.  Get enough  calcium from food or take a calcium supplement (ask your dietitian for recommendations). Food sources of calcium that do not increase your risk of kidney stones include:  Broccoli.  Dairy products, such as cheese and yogurt.  Pudding. Uric Acid Kidney Stones  Do not have more than 6 oz of animal protein per day. FOOD SOURCES Animal Protein Sources  Meat (all types).  Poultry.  Eggs.  Fish, seafood. Foods High in Salt  Salt seasonings.  Soy sauce.  Teriyaki sauce.  Cured and processed meats.  Salted crackers and snack foods.  Fast food.  Canned soups and most canned foods. Foods High in Oxalates  Grains:  Amaranth.  Barley.  Grits.  Wheat germ.  Bran.  Buckwheat flour.  All bran cereals.  Pretzels.  Whole wheat bread.  Vegetables:  Beans (wax).  Beets and beet greens.  Collard greens.  Eggplant.  Escarole.  Leeks.  Okra.  Parsley.  Rutabagas.  Spinach.  Swiss chard.  Tomato paste.  Fried potatoes.  Sweet potatoes.  Fruits:  Red currants.  Figs.  Kiwi.  Rhubarb.  Meat and Other Protein Sources:  Beans (dried).  Soy burgers and other soybean products.  Miso.    Nuts (peanuts, almonds, pecans, cashews, hazelnuts).  Nut butters.  Sesame seeds and tahini (paste made of sesame seeds).  Poppy seeds.  Beverages:  Chocolate drink mixes.  Soy milk.  Instant iced tea.  Juices made from high-oxalate fruits or vegetables.  Other:  Carob.  Chocolate.  Fruitcake.  Marmalades.   This information is not intended to replace advice given to you by your health care provider. Make sure you discuss any questions you have with your health care provider.   Document Released: 12/28/2010 Document Revised: 09/07/2013 Document Reviewed: 07/30/2013 Elsevier Interactive Patient Education 2016 Elsevier Inc.  

## 2015-12-26 ENCOUNTER — Emergency Department (HOSPITAL_BASED_OUTPATIENT_CLINIC_OR_DEPARTMENT_OTHER): Payer: BLUE CROSS/BLUE SHIELD

## 2015-12-26 ENCOUNTER — Encounter (HOSPITAL_BASED_OUTPATIENT_CLINIC_OR_DEPARTMENT_OTHER): Payer: Self-pay | Admitting: *Deleted

## 2015-12-26 ENCOUNTER — Emergency Department (HOSPITAL_BASED_OUTPATIENT_CLINIC_OR_DEPARTMENT_OTHER)
Admission: EM | Admit: 2015-12-26 | Discharge: 2015-12-26 | Disposition: A | Discharge status: Home / Self Care | Payer: BLUE CROSS/BLUE SHIELD | Attending: Emergency Medicine | Admitting: Emergency Medicine

## 2015-12-26 DIAGNOSIS — Z87448 Personal history of other diseases of urinary system: Secondary | ICD-10-CM | POA: Insufficient documentation

## 2015-12-26 DIAGNOSIS — Z8639 Personal history of other endocrine, nutritional and metabolic disease: Secondary | ICD-10-CM | POA: Insufficient documentation

## 2015-12-26 DIAGNOSIS — N23 Unspecified renal colic: Secondary | ICD-10-CM | POA: Diagnosis not present

## 2015-12-26 DIAGNOSIS — M545 Low back pain: Secondary | ICD-10-CM | POA: Diagnosis present

## 2015-12-26 DIAGNOSIS — I10 Essential (primary) hypertension: Secondary | ICD-10-CM | POA: Insufficient documentation

## 2015-12-26 DIAGNOSIS — Z3202 Encounter for pregnancy test, result negative: Secondary | ICD-10-CM | POA: Diagnosis not present

## 2015-12-26 DIAGNOSIS — N202 Calculus of kidney with calculus of ureter: Secondary | ICD-10-CM | POA: Diagnosis not present

## 2015-12-26 DIAGNOSIS — Z79818 Long term (current) use of other agents affecting estrogen receptors and estrogen levels: Secondary | ICD-10-CM | POA: Insufficient documentation

## 2015-12-26 DIAGNOSIS — Z862 Personal history of diseases of the blood and blood-forming organs and certain disorders involving the immune mechanism: Secondary | ICD-10-CM | POA: Insufficient documentation

## 2015-12-26 DIAGNOSIS — N200 Calculus of kidney: Secondary | ICD-10-CM

## 2015-12-26 HISTORY — DX: Disorder of kidney and ureter, unspecified: N28.9

## 2015-12-26 HISTORY — DX: Calculus of kidney: N20.0

## 2015-12-26 LAB — URINALYSIS, ROUTINE W REFLEX MICROSCOPIC
Bilirubin Urine: NEGATIVE
Bilirubin Urine: NEGATIVE
Glucose, UA: NEGATIVE mg/dL
Glucose, UA: NEGATIVE mg/dL
Hgb urine dipstick: NEGATIVE
Ketones, ur: NEGATIVE mg/dL
Ketones, ur: NEGATIVE mg/dL
Leukocytes, UA: NEGATIVE
Nitrite: NEGATIVE
Nitrite: NEGATIVE
Protein, ur: 100 mg/dL — AB
Protein, ur: NEGATIVE mg/dL
Specific Gravity, Urine: 1.022 (ref 1.005–1.030)
Specific Gravity, Urine: 1.018 (ref 1.005–1.030)
pH: 5.5 (ref 5.0–8.0)
pH: 6 (ref 5.0–8.0)

## 2015-12-26 LAB — CBC WITH DIFFERENTIAL/PLATELET
Basophils Absolute: 0 10*3/uL (ref 0.0–0.1)
Basophils Relative: 0 %
Eosinophils Absolute: 0.3 10*3/uL (ref 0.0–0.7)
Eosinophils Relative: 2 %
HCT: 27.7 % — ABNORMAL LOW (ref 36.0–46.0)
Hemoglobin: 9.2 g/dL — ABNORMAL LOW (ref 12.0–15.0)
Lymphocytes Relative: 19 %
Lymphs Abs: 2.7 10*3/uL (ref 0.7–4.0)
MCH: 20.7 pg — ABNORMAL LOW (ref 26.0–34.0)
MCHC: 33.2 g/dL (ref 30.0–36.0)
MCV: 62.4 fL — ABNORMAL LOW (ref 78.0–100.0)
Monocytes Absolute: 0.6 10*3/uL (ref 0.1–1.0)
Monocytes Relative: 4 %
Neutro Abs: 10.4 10*3/uL — ABNORMAL HIGH (ref 1.7–7.7)
Neutrophils Relative %: 75 %
Platelets: 414 10*3/uL — ABNORMAL HIGH (ref 150–400)
RBC: 4.44 MIL/uL (ref 3.87–5.11)
RDW: 18.4 % — ABNORMAL HIGH (ref 11.5–15.5)
WBC: 14 10*3/uL — ABNORMAL HIGH (ref 4.0–10.5)

## 2015-12-26 LAB — BASIC METABOLIC PANEL
Anion gap: 8 (ref 5–15)
BUN: 15 mg/dL (ref 6–20)
CO2: 27 mmol/L (ref 22–32)
Calcium: 9.1 mg/dL (ref 8.9–10.3)
Chloride: 102 mmol/L (ref 101–111)
Creatinine, Ser: 1.05 mg/dL — ABNORMAL HIGH (ref 0.44–1.00)
GFR calc Af Amer: >60 mL/min (ref >60)
GFR calc non Af Amer: >60 mL/min (ref >60)
Glucose, Bld: 128 mg/dL — ABNORMAL HIGH (ref 65–99)
Potassium: 3.8 mmol/L (ref 3.5–5.1)
Sodium: 137 mmol/L (ref 135–145)

## 2015-12-26 LAB — URINE MICROSCOPIC-ADD ON

## 2015-12-26 LAB — HCG, SERUM, QUALITATIVE: Preg, Serum: NEGATIVE

## 2015-12-26 MED ORDER — SODIUM CHLORIDE 0.9 % IV BOLUS (SEPSIS)
1000.0000 mL | Freq: Once | INTRAVENOUS | Status: AC
  Administered 2015-12-26: 1000 mL via INTRAVENOUS

## 2015-12-26 MED ORDER — ONDANSETRON HCL 4 MG/2ML IJ SOLN
4.0000 mg | Freq: Once | INTRAMUSCULAR | Status: AC
  Administered 2015-12-26: 4 mg via INTRAVENOUS
  Filled 2015-12-26: qty 2

## 2015-12-26 MED ORDER — KETOROLAC TROMETHAMINE 30 MG/ML IJ SOLN
30.0000 mg | Freq: Once | INTRAMUSCULAR | Status: AC
  Administered 2015-12-26: 30 mg via INTRAVENOUS
  Filled 2015-12-26: qty 1

## 2015-12-26 MED ORDER — ONDANSETRON HCL 4 MG PO TABS: 4.0000 mg | ORAL_TABLET | Freq: Four times a day (QID) | ORAL | Status: DC

## 2015-12-26 MED ORDER — HYDROMORPHONE HCL 1 MG/ML IJ SOLN
1.0000 mg | Freq: Once | INTRAMUSCULAR | Status: AC
  Administered 2015-12-26: 1 mg via INTRAVENOUS
  Filled 2015-12-26: qty 1

## 2015-12-26 MED ORDER — TAMSULOSIN HCL 0.4 MG PO CAPS: 0.4000 mg | ORAL_CAPSULE | Freq: Every day | ORAL | Status: DC

## 2015-12-26 MED ORDER — OXYCODONE-ACETAMINOPHEN 5-325 MG PO TABS: 1.0000 | ORAL_TABLET | Freq: Four times a day (QID) | ORAL | Status: DC | PRN

## 2015-12-26 MED ORDER — HYDROMORPHONE HCL 1 MG/ML IJ SOLN
1.0000 mg | INTRAMUSCULAR | Status: DC | PRN
  Administered 2015-12-26: 1 mg via INTRAVENOUS
  Filled 2015-12-26: qty 1

## 2015-12-26 MED FILL — TAMSULOSIN HCL 0.4 MG CAP: 0.4 | 30 days supply | Qty: 30 | Fill #0

## 2015-12-26 MED FILL — OXYCODONE/APAP 5-325: 5-325 | 2 days supply | Qty: 15 | Fill #0

## 2015-12-26 MED FILL — ONDANSETRON HCL 4 MG TABLET: 4 | 5 days supply | Qty: 20 | Fill #0

## 2015-12-26 NOTE — ED Provider Notes (Signed)
CSN: [id number]     Arrival date & time 12/26/15  [id number] History   First MD Initiated Contact with Patient 12/26/15 0729     No chief complaint on file.    (Consider location/radiation/quality/duration/timing/severity/associated sxs/prior Treatment) HPI Comments: 40 year old female with history of kidney stones presents for sudden onset of severe right-sided back pain pain. The patient reports pain woke her up out of sleep. She reports associated nausea. She feels like her urinary stream has been different. Patient denies fevers or chills. No pain with urination. No diarrhea or constipation.   Past Medical History  Diagnosis Date  . HYPERTENSION 09/07/2009  . Headache(784.0) 08/03/2010  . HYPERLIPIDEMIA 10/11/2009  . Anemia   . Renal disorder   . Kidney stones    Past Surgical History  Procedure Laterality Date  . Cesarean section  2004  . Breast reduction surgery  1999   Family History  Problem Relation Age of Onset  . Hypertension Mother   . Diabetes Mother   . Hypertension Father   . Stomach cancer Paternal Grandmother   . Stomach cancer Maternal Uncle   . Lung cancer Maternal Grandfather   . Prostate cancer Maternal Uncle   . Thyroid disease Maternal Uncle    Social History  Substance Use Topics  . Smoking status: Never Smoker   . Smokeless tobacco: Never Used  . Alcohol Use: No   OB History    Gravida Para Term Preterm AB TAB SAB Ectopic Multiple Living   2 1   1  1   1      Review of Systems  Constitutional: Negative for fever, chills, appetite change and fatigue.  HENT: Negative for congestion, postnasal drip, rhinorrhea and sore throat.   Respiratory: Negative for cough, chest tightness and shortness of breath.   Gastrointestinal: Negative for nausea, vomiting, abdominal pain, diarrhea and constipation.  Genitourinary: Positive for flank pain (right) and difficulty urinating. Negative for dysuria, urgency and frequency.  Musculoskeletal: Positive for back pain  (right lower back). Negative for myalgias.  Skin: Negative for rash.  Neurological: Negative for dizziness, weakness, numbness and headaches.  Hematological: Does not bruise/bleed easily.      Allergies  Aspirin and Collagenase  Home Medications   Prior to Admission medications   Medication Sig Start Date End Date Taking? Authorizing Provider  Norgestimate-Ethinyl Estradiol Triphasic (ORTHO TRI-CYCLEN LO) 0.18/0.215/0.25 MG-25 MCG tab Take 1 tablet by mouth daily. 05/31/15  Yes Terrance Mass, MD  ondansetron (ZOFRAN) 4 MG tablet Take 1 tablet (4 mg total) by mouth every 6 (six) hours. 12/26/15   Harvel Quale, MD  oxyCODONE-acetaminophen (PERCOCET/ROXICET) 5-325 MG tablet Take 1-2 tablets by mouth every 6 (six) hours as needed for moderate pain or severe pain. 12/26/15   Harvel Quale, MD  tamsulosin (FLOMAX) 0.4 MG CAPS capsule Take 1 capsule (0.4 mg total) by mouth daily. 12/26/15   Harvel Quale, MD   BP 151/92 mmHg  Pulse 89  Temp(Src) 98.1 F (36.7 C) (Oral)  Resp 18  SpO2 98%  LMP 12/14/2015 Physical Exam  Constitutional: She is oriented to person, place, and time. She appears well-developed and well-nourished. No distress.  In obvious pain and discomfort  HENT:  Head: Normocephalic and atraumatic.  Right Ear: External ear normal.  Left Ear: External ear normal.  Nose: Nose normal.  Mouth/Throat: Oropharynx is clear and moist. No oropharyngeal exudate.  Eyes: EOM are normal. Pupils are equal, round, and reactive to light.  Neck: Normal range of motion.  Neck supple.  Cardiovascular: Normal rate, regular rhythm, normal heart sounds and intact distal pulses.   No murmur heard. Pulmonary/Chest: Effort normal. No respiratory distress. She has no wheezes. She has no rales.  Abdominal: Soft. She exhibits no distension. There is no tenderness.  Musculoskeletal: Normal range of motion. She exhibits no edema or tenderness.  Neurological: She is alert and oriented to  person, place, and time.  Skin: Skin is warm and dry. No rash noted. She is not diaphoretic.  Vitals reviewed.   ED Course  Procedures (including critical care time) Labs Review Labs Reviewed  CBC WITH DIFFERENTIAL/PLATELET - Abnormal; Notable for the following:    WBC 14.0 (*)    Hemoglobin 9.2 (*)    HCT 27.7 (*)    MCV 62.4 (*)    MCH 20.7 (*)    RDW 18.4 (*)    Platelets 414 (*)    Neutro Abs 10.4 (*)    All other components within normal limits  BASIC METABOLIC PANEL - Abnormal; Notable for the following:    Glucose, Bld 128 (*)    Creatinine, Ser 1.05 (*)    All other components within normal limits  URINALYSIS, ROUTINE W REFLEX MICROSCOPIC (NOT AT [facility]) - Abnormal; Notable for the following:    APPearance CLOUDY (*)    Hgb urine dipstick TRACE (*)    Protein, ur 100 (*)    Leukocytes, UA TRACE (*)    All other components within normal limits  URINE MICROSCOPIC-ADD ON - Abnormal; Notable for the following:    Squamous Epithelial / LPF 6-30 (*)    Bacteria, UA MANY (*)    All other components within normal limits  URINALYSIS, ROUTINE W REFLEX MICROSCOPIC (NOT AT [facility]) - Abnormal; Notable for the following:    APPearance CLOUDY (*)    All other components within normal limits  HCG, SERUM, QUALITATIVE    Imaging Review Ct Renal Stone Study  12/26/2015  CLINICAL DATA:  Acute right-sided abdominal pain. EXAM: CT ABDOMEN AND PELVIS WITHOUT CONTRAST TECHNIQUE: Multidetector CT imaging of the abdomen and pelvis was performed following the standard protocol without IV contrast. COMPARISON:  CT scan of October 15, 2015. FINDINGS: Visualized lung bases are unremarkable. No significant osseous abnormality is noted. No gallstones are noted. No focal abnormality is noted in the liver, spleen or pancreas on these unenhanced images. Adrenal glands appear normal. Nonobstructive calculi are noted in lower pole collecting system of left kidney. Moderate right-sided hydroureteronephrosis  is noted. There is a 7 mm calculus seen posteriorly in the urinary bladder ; it is uncertain if it is within the bladder lumen or within the ureterovesical junction. The appendix appears normal. There is no evidence of bowel obstruction. No abnormal fluid collection is noted. Uterus and ovaries are unremarkable. No significant adenopathy is noted. Small fat containing periumbilical hernia is noted. IMPRESSION: Nonobstructive left nephrolithiasis is noted. Moderate right hydroureteronephrosis is noted. There is new 7 mm calculus seen along the posterior and inferior wall of the urinary bladder right of midline, and it is uncertain if this represents a renal calculus that has recently passed, or if it potentially may still be within the right ureterovesical junction. Electronically Signed   By: Marijo Conception, M.D.   On: 12/26/2015 08:37   I have personally reviewed and evaluated these images and lab results as part of my medical decision-making.   EKG Interpretation None      MDM  Patient was seen and evaluated in stable  condition. Patient was given IV fluids, Dilaudid, Zofran, Toradol. Laboratory results were unremarkable for patient. UA without sign of infection. CT was 7 mm moderately obstructing ureteral calculus at the UVJ almost in the bladder. Symptoms were controlled and patient was able to tolerate by mouth. She felt comfortable with plan for discharge. She was discharged with Percocet, Zofran, Flomax. She was instructed to follow up with urology in her PCP outpatient. Strict return precautions were given. Final diagnoses:  Kidney stone    1. Ureteral calculus  2. Renal colic    Harvel Quale, MD 12/26/15 [phone]

## 2015-12-26 NOTE — ED Notes (Signed)
Directed to pharmacy to pick up prescriptions 

## 2015-12-26 NOTE — ED Notes (Signed)
Pt states her allergy to aspirin is vomiting and she did it as a child. No other reaction. MD aware.

## 2015-12-26 NOTE — Discharge Instructions (Signed)
You were seen and evaluated today for your acute pain. This is secondary to a 7 mm kidney stone that was right at the edge of your bladder. Take the medications as prescribed for symptom control. If you are unable to tolerate her pain at home or if you develop fever or are unable to hold on your medications or eat or drink return to the emergency Department. Otherwise follow up with urology in your PCP outpatient.  Kidney Stones Kidney stones (urolithiasis) are deposits that form inside your kidneys. The intense pain is caused by the stone moving through the urinary tract. When the stone moves, the ureter goes into spasm around the stone. The stone is usually passed in the urine.  CAUSES   A disorder that makes certain neck glands produce too much parathyroid hormone (primary hyperparathyroidism).  A buildup of uric acid crystals, similar to gout in your joints.  Narrowing (stricture) of the ureter.  A kidney obstruction present at birth (congenital obstruction).  Previous surgery on the kidney or ureters.  Numerous kidney infections. SYMPTOMS   Feeling sick to your stomach (nauseous).  Throwing up (vomiting).  Blood in the urine (hematuria).  Pain that usually spreads (radiates) to the groin.  Frequency or urgency of urination. DIAGNOSIS   Taking a history and physical exam.  Blood or urine tests.  CT scan.  Occasionally, an examination of the inside of the urinary bladder (cystoscopy) is performed. TREATMENT   Observation.  Increasing your fluid intake.  Extracorporeal shock wave lithotripsy--This is a noninvasive procedure that uses shock waves to break up kidney stones.  Surgery may be needed if you have severe pain or persistent obstruction. There are various surgical procedures. Most of the procedures are performed with the use of small instruments. Only small incisions are needed to accommodate these instruments, so recovery time is minimized. The size, location,  and chemical composition are all important variables that will determine the proper choice of action for you. Talk to your health care provider to better understand your situation so that you will minimize the risk of injury to yourself and your kidney.  HOME CARE INSTRUCTIONS   Drink enough water and fluids to keep your urine clear or pale yellow. This will help you to pass the stone or stone fragments.  Strain all urine through the provided strainer. Keep all particulate matter and stones for your health care provider to see. The stone causing the pain may be as small as a grain of salt. It is very important to use the strainer each and every time you pass your urine. The collection of your stone will allow your health care provider to analyze it and verify that a stone has actually passed. The stone analysis will often identify what you can do to reduce the incidence of recurrences.  Only take over-the-counter or prescription medicines for pain, discomfort, or fever as directed by your health care provider.  Keep all follow-up visits as told by your health care provider. This is important.  Get follow-up X-rays if required. The absence of pain does not always mean that the stone has passed. It may have only stopped moving. If the urine remains completely obstructed, it can cause loss of kidney function or even complete destruction of the kidney. It is your responsibility to make sure X-rays and follow-ups are completed. Ultrasounds of the kidney can show blockages and the status of the kidney. Ultrasounds are not associated with any radiation and can be performed easily in a  matter of minutes.  Make changes to your daily diet as told by your health care provider. You may be told to:  Limit the amount of salt that you eat.  Eat 5 or more servings of fruits and vegetables each day.  Limit the amount of meat, poultry, fish, and eggs that you eat.  Collect a 24-hour urine sample as told by your  health care provider.You may need to collect another urine sample every 6-12 months. SEEK MEDICAL CARE IF:  You experience pain that is progressive and unresponsive to any pain medicine you have been prescribed. SEEK IMMEDIATE MEDICAL CARE IF:   Pain cannot be controlled with the prescribed medicine.  You have a fever or shaking chills.  The severity or intensity of pain increases over 18 hours and is not relieved by pain medicine.  You develop a new onset of abdominal pain.  You feel faint or pass out.  You are unable to urinate.   This information is not intended to replace advice given to you by your health care provider. Make sure you discuss any questions you have with your health care provider.   Document Released: 09/02/2005 Document Revised: 05/24/2015 Document Reviewed: 02/03/2013 Elsevier Interactive Patient Education [employer].

## 2015-12-26 NOTE — ED Notes (Signed)
C/o pain right lower right back around to right lower abd. States urine stream is different. Nausea no vomiting.

## 2016-01-09 ENCOUNTER — Other Ambulatory Visit: Payer: Self-pay | Admitting: Urology

## 2016-01-15 ENCOUNTER — Encounter (HOSPITAL_COMMUNITY): Payer: Self-pay | Admitting: General Practice

## 2016-01-17 NOTE — H&P (Signed)
Reason For Visit Management of right distal ureteral stone   History of Present Illness 40 year old female who was seen today in follow-up for management of her right distal ureteral stone. The patient was seen by Dr. Nicki Reaper MacDiarmid 10 days prior. She is seen today in follow-up for more definitive management, she has obtained a KUB prior. The patient's renal colic symptoms actually began in January. She was first seen for symptoms in the emergency department in March and a CT scan was obtained demonstrating a mid ureteral stone. She was discharged home and then re-presented to the emergency department on 12/25/15 with again severe flank pain. Repeat CT scan noted that the stone and migrated down to the UVJ region. Her pain was reasonably well controlled on oral pain medication and she had no symptoms or evidence of infection and she was discharged home. She was in [place] by Dr. Eula Flax continued medical therapy. The patient states that since she was last seen she is not having any ongoing flank pain. She does have some mild voiding symptoms including worsening frequency, urgency, but denies incontinence. She is not had any fevers or chills. She is minimal flank pain.    The patient is absolutely adamant that she does not want surgery for her stone, unwilling to accept the possibility of needing a stent. She is heard very detailed stories with friends and read a lot about this on the Internet. The patient has never had stone surgery before. She has had one child, and is not planning on having any additional children at this time.   Past Medical History Problems  1. History of hypertension (Z86.79)  Surgical History Problems  1. History of Cesarean Section  Current Meds 1. Hydrocodone-Acetaminophen 5-325 MG Oral Tablet; Hydrocodone-Acetaminophen  5/325mg  #30 q3-4hrs prn pain;  Therapy: 12Apr2017 to (Last Rx:12Apr2017) Ordered 2. Ortho Tri-Cyclen (28) TABS (Norgestim-Eth Estrad Triphasic);  Therapy: (Recorded:12Apr2017) to Recorded 3. Promethazine HCl - 25 MG Oral Tablet; TAKE 1 TABLET Every  6 hours PRN;  Therapy: 12Apr2017 to (Last Rx:12Apr2017) Ordered  Allergies Medication  1. Aspirin TABS  Family History Problems  1. Family history of diabetes mellitus (Z83.3) : Mother 2. Family history of malignant neoplasm of stomach (Z80.0) : Grandmother  Social History Problems  1. Denied: History of Alcohol use 2. Caffeine use (F15.90)   one drink daily 3. Divorced 4. Married 5. Never smoker 6. Occupation   Mental Health Counselor  Review of Systems No changes in pts bowel habits, neurological changes, or progressive lower urinary tract symptoms when compared to the patient's last visit.      Vitals Vital Signs [Data Includes: Last 1 Day]  Recorded: 21Apr2017 02:16PM  Blood Pressure: 154 / 84 Temperature: 98.2 F Heart Rate: 86  Physical Exam Constitutional: Well nourished and well developed . No acute distress.  ENT:. The ears and nose are normal in appearance.  Neck: The appearance of the neck is normal and no neck mass is present.  Pulmonary: No respiratory distress and normal respiratory rhythm and effort.  Abdomen: The abdomen is soft and nontender. No suprapubic tenderness. No CVA tenderness.    Results/Data Urine [Data Includes: Last 1 Day]   21Apr2017  COLOR YELLOW   APPEARANCE CLEAR   SPECIFIC GRAVITY 1.020   pH 6.0   GLUCOSE NEGATIVE   BILIRUBIN NEGATIVE   KETONE NEGATIVE   BLOOD NEGATIVE   PROTEIN TRACE   NITRITE NEGATIVE   LEUKOCYTE ESTERASE NEGATIVE    Patient's urinalysis today is normal with the  exception of proteinuria.  KUB:The patient's KUB is limited in the upper abdomen. The renal shadows are not clearly visible. I can see clearly below L2. A long expected trajectory of either ureter. No identified calcifications down into the pelvis. The patient does have phleboliths bilaterally. The previously seen stone on her CT scan from  12/25/15 correlates with a calcification that appears to be right at the right UVJ measuring 5 mm in size. Visualized portions of her bony structures are without significant abnormality. There are no abnormalities within the gas pattern.   Assessment Assessed  1. Right ureteral calculus (N20.1)  Plan Health Maintenance  1. UA With REFLEX; [Do Not Release]; Status:Complete;   Done: 21Apr2017 01:53PM Nephrolithiasis  2. Follow-up Schedule Surgery Office  Follow-up  Status: Complete  Done: 21Apr2017  Discussion/Summary The patient has a right UVJ stone. On the CT scan and this measured up to 7 mm. The density of the stone is between 3 and 600 Hounsfield units. It is clearly visualized on today's KUB. Fortunately, she is asymptomatic at this time. I spoke in detail with the patient about treatment options. She is now had the stone in her ureter for approximately 4 months. I recommended that she consider definitive management. We discussed the treatment options, primarily ureteroscopy and shockwave lithotripsy. Initially, we only spoke of ureteroscopy given how distal stone was and my reluctance to even recommend shockwave for this stone. However, after a long discussion about the procedure and the possibility for needing a stent postoperatively for a few days she was absolutely adamant that this was not what she wanted. We then went into detail about shockwave lithotripsy. I told her the risks associated with it in this area, especially in a young female. She understands that this may cause some vaginal bleeding and scarring within the uterus preventing her from obtaining pregnancy again, she was willing to accept this as she is no longer interested in have any additional children. I also told her that the fragmentation rate at this area of her ureter was likely to be reduced compared to more proximal ureteral stones. Further, her body habitus made the fragmentation rate even more challenging/lower. I also  explained the possibility of poor fragmentation requiring additional procedures with associated renal colic-like symptoms. Despite all my warnings and counseling that I did not think this was her best treatment option, the patient was willing to accept all these risks and wished to proceed with shockwave lithotripsy of her right UVJ stone.   cc: Dr. Carolann Littler, MD   Signatures Electronically signed by : Louis Meckel, M.D.; Jan 06 2016  7:49AM EST

## 2016-01-18 ENCOUNTER — Encounter (HOSPITAL_COMMUNITY)
Admission: RE | Disposition: A | Discharge status: Home / Self Care | Payer: Self-pay | Source: Ambulatory Visit | Attending: Urology

## 2016-01-18 ENCOUNTER — Ambulatory Visit (HOSPITAL_COMMUNITY): Payer: BLUE CROSS/BLUE SHIELD

## 2016-01-18 ENCOUNTER — Encounter (HOSPITAL_COMMUNITY): Payer: Self-pay | Admitting: *Deleted

## 2016-01-18 ENCOUNTER — Ambulatory Visit (HOSPITAL_COMMUNITY)
Admission: RE | Admit: 2016-01-18 | Discharge: 2016-01-18 | Disposition: A | Discharge status: Home / Self Care | Payer: BLUE CROSS/BLUE SHIELD | Source: Ambulatory Visit | Attending: Urology | Admitting: Urology

## 2016-01-18 DIAGNOSIS — Z79899 Other long term (current) drug therapy: Secondary | ICD-10-CM | POA: Diagnosis not present

## 2016-01-18 DIAGNOSIS — Z6841 Body Mass Index (BMI) 40.0 and over, adult: Secondary | ICD-10-CM | POA: Diagnosis not present

## 2016-01-18 DIAGNOSIS — I10 Essential (primary) hypertension: Secondary | ICD-10-CM | POA: Insufficient documentation

## 2016-01-18 DIAGNOSIS — Z79891 Long term (current) use of opiate analgesic: Secondary | ICD-10-CM | POA: Diagnosis not present

## 2016-01-18 DIAGNOSIS — Z793 Long term (current) use of hormonal contraceptives: Secondary | ICD-10-CM | POA: Diagnosis not present

## 2016-01-18 DIAGNOSIS — N201 Calculus of ureter: Secondary | ICD-10-CM | POA: Diagnosis not present

## 2016-01-18 DIAGNOSIS — E669 Obesity, unspecified: Secondary | ICD-10-CM | POA: Diagnosis not present

## 2016-01-18 LAB — PREGNANCY, URINE: Preg Test, Ur: NEGATIVE

## 2016-01-18 SURGERY — LITHOTRIPSY, ESWL
Anesthesia: LOCAL | Laterality: Right

## 2016-01-18 MED ORDER — DIAZEPAM 5 MG PO TABS
5.0000 mg | ORAL_TABLET | Freq: Once | ORAL | Status: AC
  Administered 2016-01-18: 5 mg via ORAL
  Filled 2016-01-18: qty 1

## 2016-01-18 MED ORDER — DIAZEPAM 2 MG PO TABS: 5.0000 mg | ORAL_TABLET | Freq: Once | ORAL | Status: DC

## 2016-01-18 MED ORDER — DIAZEPAM 5 MG PO TABS
10.0000 mg | ORAL_TABLET | ORAL | Status: AC
  Administered 2016-01-18: 5 mg via ORAL
  Filled 2016-01-18: qty 2

## 2016-01-18 MED ORDER — CIPROFLOXACIN HCL 500 MG PO TABS
500.0000 mg | ORAL_TABLET | ORAL | Status: AC
  Administered 2016-01-18: 500 mg via ORAL
  Filled 2016-01-18: qty 1

## 2016-01-18 MED ORDER — SODIUM CHLORIDE 0.9 % IV SOLN
INTRAVENOUS | Status: DC
Start: 2016-01-18 — End: 2016-01-18
  Administered 2016-01-18: 10:00:00 via INTRAVENOUS

## 2016-01-18 MED ORDER — DIPHENHYDRAMINE HCL 25 MG PO CAPS
25.0000 mg | ORAL_CAPSULE | ORAL | Status: AC
  Administered 2016-01-18: 25 mg via ORAL
  Filled 2016-01-18: qty 1

## 2016-01-18 MED ORDER — DIAZEPAM 2 MG PO TABS: 10.0000 mg | ORAL_TABLET | Freq: Once | ORAL | Status: DC

## 2016-01-18 NOTE — Discharge Instructions (Addendum)
Follow up with your primary care doctor regarding your elevated blood pressure readings.   Hypertension Hypertension, commonly called high blood pressure, is when the force of blood pumping through your arteries is too strong. Your arteries are the blood vessels that carry blood from your heart throughout your body. A blood pressure reading consists of a higher number over a lower number, such as 110/72. The higher number (systolic) is the pressure inside your arteries when your heart pumps. The lower number (diastolic) is the pressure inside your arteries when your heart relaxes. Ideally you want your blood pressure below 120/80. Hypertension forces your heart to work harder to pump blood. Your arteries may become narrow or stiff. Having untreated or uncontrolled hypertension can cause heart attack, stroke, kidney disease, and other problems. RISK FACTORS Some risk factors for high blood pressure are controllable. Others are not.  Risk factors you cannot control include:   Race. You may be at higher risk if you are African American.  Age. Risk increases with age.  Gender. Men are at higher risk than women before age 46 years. After age 9, women are at higher risk than men. Risk factors you can control include:  Not getting enough exercise or physical activity.  Being overweight.  Getting too much fat, sugar, calories, or salt in your diet.  Drinking too much alcohol. SIGNS AND SYMPTOMS Hypertension does not usually cause signs or symptoms. Extremely high blood pressure (hypertensive crisis) may cause headache, anxiety, shortness of breath, and nosebleed. DIAGNOSIS To check if you have hypertension, your health care provider will measure your blood pressure while you are seated, with your arm held at the level of your heart. It should be measured at least twice using the same arm. Certain conditions can cause a difference in blood pressure between your right and left arms. A blood pressure  reading that is higher than normal on one occasion does not mean that you need treatment. If it is not clear whether you have high blood pressure, you may be asked to return on a different day to have your blood pressure checked again. Or, you may be asked to monitor your blood pressure at home for 1 or more weeks. TREATMENT Treating high blood pressure includes making lifestyle changes and possibly taking medicine. Living a healthy lifestyle can help lower high blood pressure. You may need to change some of your habits. Lifestyle changes may include:  Following the DASH diet. This diet is high in fruits, vegetables, and whole grains. It is low in salt, red meat, and added sugars.  Keep your sodium intake below 2,300 mg per day.  Getting at least 30-45 minutes of aerobic exercise at least 4 times per week.  Losing weight if necessary.  Not smoking.  Limiting alcoholic beverages.  Learning ways to reduce stress. Your health care provider may prescribe medicine if lifestyle changes are not enough to get your blood pressure under control, and if one of the following is true:  You are 47-23 years of age and your systolic blood pressure is above 140.  You are 73 years of age or older, and your systolic blood pressure is above 150.  Your diastolic blood pressure is above 90.  You have diabetes, and your systolic blood pressure is over [id number] or your diastolic blood pressure is over 90.  You have kidney disease and your blood pressure is above 140/90.  You have heart disease and your blood pressure is above 140/90. Your personal target blood pressure  may vary depending on your medical conditions, your age, and other factors. HOME CARE INSTRUCTIONS  Have your blood pressure rechecked as directed by your health care provider.   Take medicines only as directed by your health care provider. Follow the directions carefully. Blood pressure medicines must be taken as prescribed. The medicine does  not work as well when you skip doses. Skipping doses also puts you at risk for problems.  Do not smoke.   Monitor your blood pressure at home as directed by your health care provider. SEEK MEDICAL CARE IF:   You think you are having a reaction to medicines taken.  You have recurrent headaches or feel dizzy.  You have swelling in your ankles.  You have trouble with your vision. SEEK IMMEDIATE MEDICAL CARE IF:  You develop a severe headache or confusion.  You have unusual weakness, numbness, or feel faint.  You have severe chest or abdominal pain.  You vomit repeatedly.  You have trouble breathing. MAKE SURE YOU:   Understand these instructions.  Will watch your condition.  Will get help right away if you are not doing well or get worse.   This information is not intended to replace advice given to you by your health care provider. Make sure you discuss any questions you have with your health care provider.   Document Released: 09/02/2005 Document Revised: 01/17/2015 Document Reviewed: 06/25/2013 Elsevier Interactive Patient Education 2016 Else[employer] Inc[employer]   Moderate Conscious Sedation, Adult, Care After Refer to this sheet in the next few weeks. These instructions provide you with information on caring for yourself after your procedure. Your health care provider may also give you more specific instructions. Your treatment has been planned according to current medical practices, but problems sometimes occur. Call your health care provider if you have any problems or questions after your procedure. WHAT TO EXPECT AFTER THE PROCEDURE  After your procedure:  You may feel sleepy, clumsy, and have poor balance for several hours.  Vomiting may occur if you eat too soon after the procedure. HOME CARE INSTRUCTIONS  Do not participate in any activities where you could become injured for at least 24 hours. Do not:  Drive.  Swim.  Ride a bicycle.  Operate heavy  machinery.  Cook.  Use power tools.  Climb ladders.  Work from a high place.  Do not make important decisions or sign legal documents until you are improved.  If you vomit, drink water, juice, or soup when you can drink without vomiting. Make sure you have little or no nausea before eating solid foods.  Only take over-the-counter or prescription medicines for pain, discomfort, or fever as directed by your health care provider.  Make sure you and your family fully understand everything about the medicines given to you, including what side effects may occur.  You should not drink alcohol, take sleeping pills, or take medicines that cause drowsiness for at least 24 hours.  If you smoke, do not smoke without supervision.  If you are feeling better, you may resume normal activities 24 hours after you were sedated.  Keep all appointments with your health care provider. SEEK MEDICAL CARE IF:  Your skin is pale or bluish in color.  You continue to feel nauseous or vomit.  Your pain is getting worse and is not helped by medicine.  You have bleeding or swelling.  You are still sleepy or feeling clumsy after 24 hours. SEEK IMMEDIATE MEDICAL CARE IF:  You develop a rash.  You  have difficulty breathing.  You develop any type of allergic problem.  You have a fever. MAKE SURE YOU:  Understand these instructions.  Will watch your condition.  Will get help right away if you are not doing well or get worse.   This information is not intended to replace advice given to you by your health care provider. Make sure you discuss any questions you have with your health care provider.   Document Released: 06/23/2013 Document Revised: 09/23/2014 Document Reviewed: 06/23/2013 Elsevier Interactive Patient Education [employer].

## 2016-01-18 NOTE — Interval H&P Note (Signed)
History and Physical Interval Note:  01/18/2016 10:36 AM  Ashley Henry  has presented today for surgery, with the diagnosis of RIGHT URETERAL VESICLE JUNCTION STONE  The various methods of treatment have been discussed with the patient and family. After consideration of risks, benefits and other options for treatment, the patient has consented to  Procedure(s): RIGHT EXTRACORPOREAL SHOCK WAVE LITHOTRIPSY (ESWL) (Right) as a surgical intervention .  The patient's history has been reviewed, patient examined, no change in status, stable for surgery.  I have reviewed the patient's chart and labs.  Questions were answered to the patient's satisfaction.     Neomia Herbel I Raejean Swinford

## 2016-01-18 NOTE — Final Progress Note (Signed)
Urology Lithotripsy Cancellation Note:   Pt is cancelled for the following reasons: positioni   1. Uncontrolled hypertention 185/104; 175/ 117.     2.  Unsuccessful positioning due to body habitus : 5\' 4" , 280 lbs.   Pt is counseled to see her medical physician for BP evaluation and Rx  Also, see Dr. Louis Meckel for stone disposition.

## 2016-01-18 NOTE — Progress Notes (Signed)
Pt recovered in short stay after receiving some sedation with aborted litho procedure.  Discharged via wheelchair in her mother's care to follow-up with hypertension.

## 2016-01-19 ENCOUNTER — Ambulatory Visit (INDEPENDENT_AMBULATORY_CARE_PROVIDER_SITE_OTHER): Payer: BLUE CROSS/BLUE SHIELD | Admitting: Family Medicine

## 2016-01-19 VITALS — BP 170/100 | HR 98 | Temp 98.2°F | Ht 64.0 in | Wt 276.0 lb

## 2016-01-19 DIAGNOSIS — I10 Essential (primary) hypertension: Secondary | ICD-10-CM

## 2016-01-19 MED ORDER — AMLODIPINE BESYLATE 5 MG PO TABS: 5.0000 mg | ORAL_TABLET | Freq: Every day | ORAL | Status: DC

## 2016-01-19 NOTE — Progress Notes (Signed)
   Subjective:    Patient ID: Ashley Henry, female    DOB: 1976-08-26, 40 y.o.   MRN: [id number]  HPI Patient here to discuss hypertension. She states she was treated about 10 years ago then taken off medication. Recent issues with kidney stone. She had several readings over the past couple days ranging between [id number] to [id number] systolic and [id number] diastolic. Went in for lithotripsy yesterday but they did not carry through because of her blood pressure No headaches. No dizziness. No chest pains. No alcohol use. No regular use of nonsteroidals. Currently denies any pain.  Past Medical History  Diagnosis Date  . HYPERTENSION 09/07/2009  . HYPERLIPIDEMIA 10/11/2009  . Anemia   . Renal disorder   . Kidney stones    Past Surgical History  Procedure Laterality Date  . Cesarean section  2004  . Breast reduction surgery  1999    reports that she has never smoked. She has never used smokeless tobacco. She reports that she does not drink alcohol or use illicit drugs. family history includes Diabetes in her mother; Hypertension in her father and mother; Lung cancer in her maternal grandfather; Prostate cancer in her maternal uncle; Stomach cancer in her maternal uncle and paternal grandmother; Thyroid disease in her maternal uncle. Allergies  Allergen Reactions  . Collagenase Hives  . Aspirin Rash       Review of Systems  Constitutional: Negative for fatigue.  Eyes: Negative for visual disturbance.  Respiratory: Negative for cough, chest tightness, shortness of breath and wheezing.   Cardiovascular: Negative for chest pain, palpitations and leg swelling.  Neurological: Negative for dizziness, seizures, syncope, weakness, light-headedness and headaches.       Objective:   Physical Exam  Constitutional: She appears well-developed and well-nourished.  Eyes: Pupils are equal, round, and reactive to light.  Neck: Neck supple. No JVD present. No thyromegaly present.  Cardiovascular:  Normal rate and regular rhythm.  Exam reveals no gallop.   Pulmonary/Chest: Effort normal and breath sounds normal. No respiratory distress. She has no wheezes. She has no rales.  Musculoskeletal: She exhibits no edema.  Neurological: She is alert.          Assessment & Plan:  Hypertension. Currently untreated. Repeat reading today 170/100. Start amlodipine 5 mg daily. Reassess 2 weeks. She is encouraged to lose some weight. Information on DASH diet given. Watch sodium intake.

## 2016-01-19 NOTE — Progress Notes (Signed)
Pre visit review using our clinic review tool, if applicable. No additional management support is needed unless otherwise documented below in the visit note. 

## 2016-01-19 NOTE — Patient Instructions (Signed)
DASH Eating Plan °DASH stands for "Dietary Approaches to Stop Hypertension." The DASH eating plan is a healthy eating plan that has been shown to reduce high blood pressure (hypertension). Additional health benefits may include reducing the risk of type 2 diabetes mellitus, heart disease, and stroke. The DASH eating plan may also help with weight loss. °WHAT DO I NEED TO KNOW ABOUT THE DASH EATING PLAN? °For the DASH eating plan, you will follow these general guidelines: °· Choose foods with a percent daily value for sodium of less than 5% (as listed on the food label). °· Use salt-free seasonings or herbs instead of table salt or sea salt. °· Check with your health care provider or pharmacist before using salt substitutes. °· Eat lower-sodium products, often labeled as "lower sodium" or "no salt added." °· Eat fresh foods. °· Eat more vegetables, fruits, and low-fat dairy products. °· Choose whole grains. Look for the word "whole" as the first word in the ingredient list. °· Choose fish and skinless chicken or turkey more often than red meat. Limit fish, poultry, and meat to 6 oz (170 g) each day. °· Limit sweets, desserts, sugars, and sugary drinks. °· Choose heart-healthy fats. °· Limit cheese to 1 oz (28 g) per day. °· Eat more home-cooked food and less restaurant, buffet, and fast food. °· Limit fried foods. °· Cook foods using methods other than frying. °· Limit canned vegetables. If you do use them, rinse them well to decrease the sodium. °· When eating at a restaurant, ask that your food be prepared with less salt, or no salt if possible. °WHAT FOODS CAN I EAT? °Seek help from a dietitian for individual calorie needs. °Grains °Whole grain or whole wheat bread. Brown rice. Whole grain or whole wheat pasta. Quinoa, bulgur, and whole grain cereals. Low-sodium cereals. Corn or whole wheat flour tortillas. Whole grain cornbread. Whole grain crackers. Low-sodium crackers. °Vegetables °Fresh or frozen vegetables  (raw, steamed, roasted, or grilled). Low-sodium or reduced-sodium tomato and vegetable juices. Low-sodium or reduced-sodium tomato sauce and paste. Low-sodium or reduced-sodium canned vegetables.  °Fruits °All fresh, canned (in natural juice), or frozen fruits. °Meat and Other Protein Products °Ground beef (85% or leaner), grass-fed beef, or beef trimmed of fat. Skinless chicken or turkey. Ground chicken or turkey. Pork trimmed of fat. All fish and seafood. Eggs. Dried beans, peas, or lentils. Unsalted nuts and seeds. Unsalted canned beans. °Dairy °Low-fat dairy products, such as skim or 1% milk, 2% or reduced-fat cheeses, low-fat ricotta or cottage cheese, or plain low-fat yogurt. Low-sodium or reduced-sodium cheeses. °Fats and Oils °Tub margarines without trans fats. Light or reduced-fat mayonnaise and salad dressings (reduced sodium). Avocado. Safflower, olive, or canola oils. Natural peanut or almond butter. °Other °Unsalted popcorn and pretzels. °The items listed above may not be a complete list of recommended foods or beverages. Contact your dietitian for more options. °WHAT FOODS ARE NOT RECOMMENDED? °Grains °White bread. White pasta. White rice. Refined cornbread. Bagels and croissants. Crackers that contain trans fat. °Vegetables °Creamed or fried vegetables. Vegetables in a cheese sauce. Regular canned vegetables. Regular canned tomato sauce and paste. Regular tomato and vegetable juices. °Fruits °Dried fruits. Canned fruit in light or heavy syrup. Fruit juice. °Meat and Other Protein Products °Fatty cuts of meat. Ribs, chicken wings, bacon, sausage, bologna, salami, chitterlings, fatback, hot dogs, bratwurst, and packaged luncheon meats. Salted nuts and seeds. Canned beans with salt. °Dairy °Whole or 2% milk, cream, half-and-half, and cream cheese. Whole-fat or sweetened yogurt. Full-fat   cheeses or blue cheese. Nondairy creamers and whipped toppings. Processed cheese, cheese spreads, or cheese  curds. °Condiments °Onion and garlic salt, seasoned salt, table salt, and sea salt. Canned and packaged gravies. Worcestershire sauce. Tartar sauce. Barbecue sauce. Teriyaki sauce. Soy sauce, including reduced sodium. Steak sauce. Fish sauce. Oyster sauce. Cocktail sauce. Horseradish. Ketchup and mustard. Meat flavorings and tenderizers. Bouillon cubes. Hot sauce. Tabasco sauce. Marinades. Taco seasonings. Relishes. °Fats and Oils °Butter, stick margarine, lard, shortening, ghee, and bacon fat. Coconut, palm kernel, or palm oils. Regular salad dressings. °Other °Pickles and olives. Salted popcorn and pretzels. °The items listed above may not be a complete list of foods and beverages to avoid. Contact your dietitian for more information. °WHERE CAN I FIND MORE INFORMATION? °National [employer], [employer], [employer]: www.nhlbi.nih.gov/health/health-topics/topics/dash/ °  °This information is not intended to replace advice given to you by your health care provider. Make sure you discuss any questions you have with your health care provider. °  °Document Released: 08/22/2011 Document Revised: 09/23/2014 Document Reviewed: 07/07/2013 °Elsevier Interactive Patient Education ©2016 Else[employer] Inc[employer] ° °Hypertension °Hypertension, commonly called high blood pressure, is when the force of blood pumping through your arteries is too strong. Your arteries are the blood vessels that carry blood from your heart throughout your body. A blood pressure reading consists of a higher number over a lower number, such as 110/72. The higher number (systolic) is the pressure inside your arteries when your heart pumps. The lower number (diastolic) is the pressure inside your arteries when your heart relaxes. Ideally you want your blood pressure below 120/80. °Hypertension forces your heart to work harder to pump blood. Your arteries may become narrow or stiff. Having untreated or uncontrolled hypertension can cause heart attack, stroke, kidney  disease, and other problems. °RISK FACTORS °Some risk factors for high blood pressure are controllable. Others are not.  °Risk factors you cannot control include:  °· Race. You may be at higher risk if you are African American. °· Age. Risk increases with age. °· Gender. Men are at higher risk than women before age 45 years. After age 65, women are at higher risk than men. °Risk factors you can control include: °· Not getting enough exercise or physical activity. °· Being overweight. °· Getting too much fat, sugar, calories, or salt in your diet. °· Drinking too much alcohol. °SIGNS AND SYMPTOMS °Hypertension does not usually cause signs or symptoms. Extremely high blood pressure (hypertensive crisis) may cause headache, anxiety, shortness of breath, and nosebleed. °DIAGNOSIS °To check if you have hypertension, your health care provider will measure your blood pressure while you are seated, with your arm held at the level of your heart. It should be measured at least twice using the same arm. Certain conditions can cause a difference in blood pressure between your right and left arms. A blood pressure reading that is higher than normal on one occasion does not mean that you need treatment. If it is not clear whether you have high blood pressure, you may be asked to return on a different day to have your blood pressure checked again. Or, you may be asked to monitor your blood pressure at home for 1 or more weeks. °TREATMENT °Treating high blood pressure includes making lifestyle changes and possibly taking medicine. Living a healthy lifestyle can help lower high blood pressure. You may need to change some of your habits. °Lifestyle changes may include: °· Following the DASH diet. This diet is high in fruits, vegetables, and whole   grains. It is low in salt, red meat, and added sugars. °· Keep your sodium intake below 2,300 mg per day. °· Getting at least 30-45 minutes of aerobic exercise at least 4 times per  week. °· Losing weight if necessary. °· Not smoking. °· Limiting alcoholic beverages. °· Learning ways to reduce stress. °Your health care provider may prescribe medicine if lifestyle changes are not enough to get your blood pressure under control, and if one of the following is true: °· You are 18-59 years of age and your systolic blood pressure is above 140. °· You are 60 years of age or older, and your systolic blood pressure is above 150. °· Your diastolic blood pressure is above 90. °· You have diabetes, and your systolic blood pressure is over 140 or your diastolic blood pressure is over 90. °· You have kidney disease and your blood pressure is above 140/90. °· You have heart disease and your blood pressure is above 140/90. °Your personal target blood pressure may vary depending on your medical conditions, your age, and other factors. °HOME CARE INSTRUCTIONS °· Have your blood pressure rechecked as directed by your health care provider.   °· Take medicines only as directed by your health care provider. Follow the directions carefully. Blood pressure medicines must be taken as prescribed. The medicine does not work as well when you skip doses. Skipping doses also puts you at risk for problems. °· Do not smoke.   °· Monitor your blood pressure at home as directed by your health care provider.  °SEEK MEDICAL CARE IF:  °· You think you are having a reaction to medicines taken. °· You have recurrent headaches or feel dizzy. °· You have swelling in your ankles. °· You have trouble with your vision. °SEEK IMMEDIATE MEDICAL CARE IF: °· You develop a severe headache or confusion. °· You have unusual weakness, numbness, or feel faint. °· You have severe chest or abdominal pain. °· You vomit repeatedly. °· You have trouble breathing. °MAKE SURE YOU:  °· Understand these instructions. °· Will watch your condition. °· Will get help right away if you are not doing well or get worse. °  °This information is not intended to  replace advice given to you by your health care provider. Make sure you discuss any questions you have with your health care provider. °  °Document Released: 09/02/2005 Document Revised: 01/17/2015 Document Reviewed: 06/25/2013 °Elsevier Interactive Patient Education ©2016 Elsevier Inc. ° °

## 2016-02-05 ENCOUNTER — Ambulatory Visit (INDEPENDENT_AMBULATORY_CARE_PROVIDER_SITE_OTHER): Payer: BLUE CROSS/BLUE SHIELD | Admitting: Family Medicine

## 2016-02-05 VITALS — BP 158/96 | HR 103 | Temp 98.3°F | Ht 64.0 in | Wt 277.3 lb

## 2016-02-05 DIAGNOSIS — I10 Essential (primary) hypertension: Secondary | ICD-10-CM | POA: Diagnosis not present

## 2016-02-05 MED ORDER — VALSARTAN 160 MG PO TABS: 160.0000 mg | ORAL_TABLET | Freq: Every day | ORAL | Status: DC

## 2016-02-05 NOTE — Progress Notes (Signed)
Pre visit review using our clinic review tool, if applicable. No additional management support is needed unless otherwise documented below in the visit note. 

## 2016-02-05 NOTE — Progress Notes (Signed)
   Subjective:    Patient ID: Ashley Henry, female    DOB: 1976-08-01, 40 y.o.   MRN: [id number]  HPI Patient here for follow-up hypertension. Recent severe readings which occurred in several of her settings. We initiated amlodipine 5 mg daily. She only took this for 1 week and she states she developed consistent symptoms of "upset stomach" each time after he took medication. She has been off now for 2 weeks. She did not monitor blood pressure when she was taking medication Still no exercise. No headaches. No dizziness. No alcohol use. Nonsmoker.  Past Medical History  Diagnosis Date  . HYPERTENSION 09/07/2009  . HYPERLIPIDEMIA 10/11/2009  . Anemia   . Renal disorder   . Kidney stones    Past Surgical History  Procedure Laterality Date  . Cesarean section  2004  . Breast reduction surgery  1999    reports that she has never smoked. She has never used smokeless tobacco. She reports that she does not drink alcohol or use illicit drugs. family history includes Diabetes in her mother; Hypertension in her father and mother; Lung cancer in her maternal grandfather; Prostate cancer in her maternal uncle; Stomach cancer in her maternal uncle and paternal grandmother; Thyroid disease in her maternal uncle. Allergies  Allergen Reactions  . Amlodipine Nausea Only  . Collagenase Hives  . Aspirin Rash      Review of Systems  Constitutional: Negative for fatigue and unexpected weight change.  Eyes: Negative for visual disturbance.  Respiratory: Negative for cough, chest tightness, shortness of breath and wheezing.   Cardiovascular: Negative for chest pain, palpitations and leg swelling.  Neurological: Negative for dizziness, seizures, syncope, weakness, light-headedness and headaches.       Objective:   Physical Exam  Constitutional: She appears well-developed and well-nourished. No distress.  Neck: Neck supple. No JVD present.  Cardiovascular: Normal rate and regular rhythm.     Pulmonary/Chest: Effort normal and breath sounds normal. No respiratory distress. She has no wheezes. She has no rales.  Musculoskeletal: She exhibits no edema.          Assessment & Plan:  Hypertension. Possible side effects from amlodipine though we explained her symptoms are not common. We reviewed other options. Discussed angiotensin receptor blocker group to minimize side effects though not always as efficacious in blacks. We also explained that this is not advisable for pregnancy but she takes oral contraception daily and has no plans whatsoever of ever getting pregnant. She is aware that if she changes her mind regarding this we would need to change blood pressure medication. Start valsartan 160 mg once daily. Reassess 3 weeks and obtain basic metabolic panel then  Eulas Post MD [facility]

## 2016-02-19 ENCOUNTER — Other Ambulatory Visit: Payer: Self-pay | Admitting: Urology

## 2016-02-26 ENCOUNTER — Ambulatory Visit (INDEPENDENT_AMBULATORY_CARE_PROVIDER_SITE_OTHER): Payer: BLUE CROSS/BLUE SHIELD | Admitting: Family Medicine

## 2016-02-26 VITALS — BP 122/90 | HR 104 | Temp 98.3°F | Ht 64.0 in | Wt 280.0 lb

## 2016-02-26 DIAGNOSIS — I10 Essential (primary) hypertension: Secondary | ICD-10-CM

## 2016-02-26 MED ORDER — FELODIPINE ER 5 MG PO TB24: 5.0000 mg | ORAL_TABLET | Freq: Every day | ORAL | Status: DC

## 2016-02-26 NOTE — Progress Notes (Signed)
Pre visit review using our clinic review tool, if applicable. No additional management support is needed unless otherwise documented below in the visit note. 

## 2016-02-26 NOTE — Progress Notes (Signed)
   Subjective:    Patient ID: Ashley Henry, female    DOB: 11-Dec-1975, 40 y.o.   MRN: [id number]  HPI Patient seen for follow-up hypertension Recently started valsartan and that she started having loose stools about 4 times per day which is totally new symptom. No bloody stools. She's never had any the symptoms still she started valsartan and. No nausea or vomiting.  Previously took amlodipine but had some nausea and discontinued after 2 days. She did not have any allergic type reactions with amlodipine previously. No headache. No peripheral edema.  Recent kidney stones. She is staying well-hydrated. Watching her sodium intake. No regular alcohol use.  Past Medical History  Diagnosis Date  . HYPERTENSION 09/07/2009  . HYPERLIPIDEMIA 10/11/2009  . Anemia   . Renal disorder   . Kidney stones    Past Surgical History  Procedure Laterality Date  . Cesarean section  2004  . Breast reduction surgery  1999    reports that she has never smoked. She has never used smokeless tobacco. She reports that she does not drink alcohol or use illicit drugs. family history includes Diabetes in her mother; Hypertension in her father and mother; Lung cancer in her maternal grandfather; Prostate cancer in her maternal uncle; Stomach cancer in her maternal uncle and paternal grandmother; Thyroid disease in her maternal uncle. Allergies  Allergen Reactions  . Amlodipine Nausea Only  . Collagenase Hives  . Aspirin Rash      Review of Systems  Constitutional: Negative for fatigue and unexpected weight change.  Eyes: Negative for visual disturbance.  Respiratory: Negative for cough, chest tightness, shortness of breath and wheezing.   Cardiovascular: Negative for chest pain, palpitations and leg swelling.  Neurological: Negative for dizziness, seizures, syncope, weakness, light-headedness and headaches.       Objective:   Physical Exam  Constitutional: She appears well-developed and  well-nourished.  Cardiovascular: Normal rate and regular rhythm.   Pulmonary/Chest: Effort normal and breath sounds normal. No respiratory distress. She has no wheezes. She has no rales.  Musculoskeletal: She exhibits no edema.          Assessment & Plan:  Hypertension. Possible adverse side effect with valsartan -though blood pressure improved. Discontinue valsartan. Since she did not have a true allergic reaction with amlodipine and we've recommended trial of felodipine 5 mg once daily. Reassess in one month. She is encouraged to lose some weight.  Eulas Post MD [facility] at [facility]

## 2016-03-07 ENCOUNTER — Other Ambulatory Visit: Payer: Self-pay | Admitting: Family Medicine

## 2016-03-29 ENCOUNTER — Ambulatory Visit: Payer: BLUE CROSS/BLUE SHIELD | Admitting: Family Medicine

## 2016-04-01 ENCOUNTER — Ambulatory Visit (INDEPENDENT_AMBULATORY_CARE_PROVIDER_SITE_OTHER): Payer: BLUE CROSS/BLUE SHIELD | Admitting: Family Medicine

## 2016-04-01 VITALS — BP 140/88 | HR 100 | Temp 98.3°F | Ht 64.0 in | Wt 278.0 lb

## 2016-04-01 DIAGNOSIS — I10 Essential (primary) hypertension: Secondary | ICD-10-CM | POA: Diagnosis not present

## 2016-04-01 NOTE — Progress Notes (Signed)
Subjective:     Patient ID: Ashley Henry, female   DOB: 02-Feb-1976, 40 y.o.   MRN: [id number]  HPI Follow-up hypertension. Patient previously took losartan but had diarrhea. She had nausea previously with amlodipine. We tried Plendil but she states she had nausea with that as well. She took this for a full week but had daily nausea and symptoms went away when she discontinued. No vomiting. Not currently exercising. She tries to watch sodium intake closely. No alcohol use. Nonsmoker. No recent headaches or peripheral edema.  Past Medical History  Diagnosis Date  . HYPERTENSION 09/07/2009  . HYPERLIPIDEMIA 10/11/2009  . Anemia   . Renal disorder   . Kidney stones    Past Surgical History  Procedure Laterality Date  . Cesarean section  2004  . Breast reduction surgery  1999    reports that she has never smoked. She has never used smokeless tobacco. She reports that she does not drink alcohol or use illicit drugs. family history includes Diabetes in her mother; Hypertension in her father and mother; Lung cancer in her maternal grandfather; Prostate cancer in her maternal uncle; Stomach cancer in her maternal uncle and paternal grandmother; Thyroid disease in her maternal uncle. Allergies  Allergen Reactions  . Amlodipine Nausea Only  . Collagenase Hives  . Aspirin Rash     Review of Systems  Constitutional: Negative for fatigue and unexpected weight change.  Eyes: Negative for visual disturbance.  Respiratory: Negative for cough, chest tightness, shortness of breath and wheezing.   Cardiovascular: Negative for chest pain, palpitations and leg swelling.  Neurological: Negative for dizziness, seizures, syncope, weakness, light-headedness and headaches.       Objective:   Physical Exam  Constitutional: She appears well-developed and well-nourished.  Eyes: Pupils are equal, round, and reactive to light.  Neck: Neck supple. No JVD present. No thyromegaly present.   Cardiovascular: Normal rate and regular rhythm.  Exam reveals no gallop.   Pulmonary/Chest: Effort normal and breath sounds normal. No respiratory distress. She has no wheezes. She has no rales.  Musculoskeletal: She exhibits no edema.  Neurological: She is alert.       Assessment:     Hypertension. Patient intolerant of multiple medications including amlodipine, Plendil, losartan.    Plan:     Discussed options with patient. We decided on trying to modify lifestyle and especially lose weight and establish more aerobic exercise and reassess blood pressure in 3 months. If not better controlled at that point consider either ACE inhibitor or low-dose HCTZ.  Eulas Post MD [facility] at [facility]

## 2016-04-01 NOTE — Progress Notes (Signed)
Pre visit review using our clinic review tool, if applicable. No additional management support is needed unless otherwise documented below in the visit note. 

## 2016-04-01 NOTE — Patient Instructions (Signed)
Lose some weight Watch sodium intake Let's plan follow up in 4 months

## 2016-04-05 ENCOUNTER — Encounter (HOSPITAL_BASED_OUTPATIENT_CLINIC_OR_DEPARTMENT_OTHER): Payer: Self-pay

## 2016-04-05 ENCOUNTER — Ambulatory Visit (HOSPITAL_BASED_OUTPATIENT_CLINIC_OR_DEPARTMENT_OTHER): Admit: 2016-04-05 | Payer: BLUE CROSS/BLUE SHIELD | Admitting: Urology

## 2016-04-05 SURGERY — CYSTOURETEROSCOPY, WITH RETROGRADE PYELOGRAM AND STENT INSERTION
Anesthesia: General | Laterality: Right

## 2016-04-29 ENCOUNTER — Other Ambulatory Visit: Payer: Self-pay | Admitting: Gynecology

## 2016-05-31 ENCOUNTER — Other Ambulatory Visit: Payer: Self-pay | Admitting: Gynecology

## 2016-05-31 ENCOUNTER — Encounter: Payer: Self-pay | Admitting: Gynecology

## 2016-06-03 ENCOUNTER — Encounter: Payer: Self-pay | Admitting: Gynecology

## 2016-06-17 ENCOUNTER — Encounter: Payer: Self-pay | Admitting: Gynecology

## 2016-06-17 ENCOUNTER — Ambulatory Visit (INDEPENDENT_AMBULATORY_CARE_PROVIDER_SITE_OTHER): Payer: BLUE CROSS/BLUE SHIELD | Admitting: Gynecology

## 2016-06-17 VITALS — BP 125/89 | Ht 63.0 in | Wt 286.8 lb

## 2016-06-17 DIAGNOSIS — Z01419 Encounter for gynecological examination (general) (routine) without abnormal findings: Secondary | ICD-10-CM

## 2016-06-17 DIAGNOSIS — N942 Vaginismus: Secondary | ICD-10-CM

## 2016-06-17 LAB — LIPID PANEL
Cholesterol: 171 mg/dL (ref 125–200)
HDL: 46 mg/dL (ref >=46)
LDL Cholesterol: 109 mg/dL (ref <130)
Total CHOL/HDL Ratio: 3.7 Ratio (ref <=5.0)
Triglycerides: 78 mg/dL (ref <150)
VLDL: 16 mg/dL (ref <30)

## 2016-06-17 LAB — COMPREHENSIVE METABOLIC PANEL
ALT: 9 U/L (ref 6–29)
AST: 12 U/L (ref 10–30)
Albumin: 3.6 g/dL (ref 3.6–5.1)
Alkaline Phosphatase: 68 U/L (ref 33–115)
BUN: 12 mg/dL (ref 7–25)
CO2: 24 mmol/L (ref 20–31)
Calcium: 8.7 mg/dL (ref 8.6–10.2)
Chloride: 103 mmol/L (ref 98–110)
Creat: 0.91 mg/dL (ref 0.50–1.10)
Glucose, Bld: 93 mg/dL (ref 65–99)
Potassium: 4.2 mmol/L (ref 3.5–5.3)
Sodium: 139 mmol/L (ref 135–146)
Total Bilirubin: 0.2 mg/dL (ref 0.2–1.2)
Total Protein: 7.3 g/dL (ref 6.1–8.1)

## 2016-06-17 LAB — TSH: TSH: 3.41 mIU/L

## 2016-06-17 NOTE — Progress Notes (Signed)
Ashley Henry 01-26-1976 [id number]   History:    40 y.o.  for annual gyn exam with no complaints today. Patient is fasting for her blood work today. Due to the fact the patient is morbidly obese and her vaginismus [place] difficult to do an adequate pelvic exam so she has annual ultrasounds. Her ultrasound 2016 was normal. Patient on oral contraceptive pill consisting of Tri-Cyclen Lo 28 days and is having normal menstrual cycles. Patient declined flu vaccine today. Patient with no change in sexual partner. Patient with no previous history of any abnormal Pap smear.  Past medical history,surgical history, family history and social history were all reviewed and documented in the EPIC chart.  Gynecologic History Patient's last menstrual period was 06/06/2016. Contraception: OCP (estrogen/progesterone) Last Pap: 2015. Results were: normal Last mammogram: Not indicated. Results were: Not indicated  Obstetric History OB History  Gravida Para Term Preterm AB Living  2 1     1 1   SAB TAB Ectopic Multiple Live Births  1            # Outcome Date GA Lbr Len/2nd Weight Sex Delivery Anes PTL Lv  2 SAB           1 Para                ROS: A ROS was performed and pertinent positives and negatives are included in the history.  GENERAL: No fevers or chills. HEENT: No change in vision, no earache, sore throat or sinus congestion. NECK: No pain or stiffness. CARDIOVASCULAR: No chest pain or pressure. No palpitations. PULMONARY: No shortness of breath, cough or wheeze. GASTROINTESTINAL: No abdominal pain, nausea, vomiting or diarrhea, melena or bright red blood per rectum. GENITOURINARY: No urinary frequency, urgency, hesitancy or dysuria. MUSCULOSKELETAL: No joint or muscle pain, no back pain, no recent trauma. DERMATOLOGIC: No rash, no itching, no lesions. ENDOCRINE: No polyuria, polydipsia, no heat or cold intolerance. No recent change in weight. HEMATOLOGICAL: No anemia or easy bruising or bleeding.  NEUROLOGIC: No headache, seizures, numbness, tingling or weakness. PSYCHIATRIC: No depression, no loss of interest in normal activity or change in sleep pattern.     Exam: chaperone present  BP 125/89   Ht 5\' 3"  (1.6 m)   Wt 286 lb 12.8 oz (130.1 kg)   LMP 06/06/2016   BMI 50.80 kg/m   Body mass index is 50.8 kg/m.  General appearance : Well developed well nourished female. No acute distress HEENT: Eyes: no retinal hemorrhage or exudates,  Neck supple, trachea midline, no carotid bruits, no thyroidmegaly Lungs: Clear to auscultation, no rhonchi or wheezes, or rib retractions  Heart: Regular rate and rhythm, no murmurs or gallops Breast:Examined in sitting and supine position were symmetrical in appearance, no palpable masses or tenderness,  no skin retraction, no nipple inversion, no nipple discharge, no skin discoloration, no axillary or supraclavicular lymphadenopathy Abdomen: no palpable masses or tenderness, no rebound or guarding Extremities: no edema or skin discoloration or tenderness  Pelvic:  Bartholin, Urethra, Skene Glands: Within normal limits             Vagina: No gross lesions or discharge  Cervix: No gross lesions or discharge  Uterus  very 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 female for annual exam will return back next week for a ultrasound due to the fact the patient is morbidly obese and due to her vaginismus restricting adequate pelvic exam. We are doing this every year. Patient has a BMI of 50. The following screening blood work was ordered today: Comprehensive metabolic panel, fasting lipid profile, TSH, CBC, and urinalysis. Pap smear not indicated this year. Patient declined flu vaccine. Prescription refill for oral contraceptive pill  provided.   Terrance Mass MD, 12:42 PM 06/17/2016

## 2016-06-18 ENCOUNTER — Encounter: Payer: Self-pay | Admitting: Gynecology

## 2016-06-18 LAB — CBC WITH DIFFERENTIAL/PLATELET
Basophils Absolute: 0 cells/uL (ref 0–200)
Basophils Relative: 0 %
Eosinophils Absolute: 242 cells/uL (ref 15–500)
Eosinophils Relative: 2 %
HCT: 29.1 % — ABNORMAL LOW (ref 35.0–45.0)
Hemoglobin: 9.2 g/dL — ABNORMAL LOW (ref 11.7–15.5)
Lymphocytes Relative: 28 %
Lymphs Abs: 3388 cells/uL (ref 850–3900)
MCH: 20.4 pg — ABNORMAL LOW (ref 27.0–33.0)
MCHC: 31.6 g/dL — ABNORMAL LOW (ref 32.0–36.0)
MCV: 64.7 fL — ABNORMAL LOW (ref 80.0–100.0)
Monocytes Absolute: 605 cells/uL (ref 200–950)
Monocytes Relative: 5 %
Neutro Abs: 7865 cells/uL — ABNORMAL HIGH (ref 1500–7800)
Neutrophils Relative %: 65 %
Platelets: 431 10*3/uL — ABNORMAL HIGH (ref 140–400)
RBC: 4.5 MIL/uL (ref 3.80–5.10)
RDW: 20 % — ABNORMAL HIGH (ref 11.0–15.0)
WBC: 12.1 10*3/uL — ABNORMAL HIGH (ref 3.8–10.8)

## 2016-06-18 LAB — URINALYSIS W MICROSCOPIC + REFLEX CULTURE
Bacteria, UA: NONE SEEN [HPF]
Bilirubin Urine: NEGATIVE
Casts: NONE SEEN [LPF]
Crystals: NONE SEEN [HPF]
Glucose, UA: NEGATIVE
Hgb urine dipstick: NEGATIVE
Ketones, ur: NEGATIVE
Leukocytes, UA: NEGATIVE
Nitrite: NEGATIVE
Specific Gravity, Urine: 1.016 (ref 1.001–1.035)
Squamous Epithelial / LPF: NONE SEEN [HPF] (ref <=5)
WBC, UA: NONE SEEN WBC/HPF (ref <=5)
Yeast: NONE SEEN [HPF]
pH: 5.5 (ref 5.0–8.0)

## 2016-06-19 ENCOUNTER — Telehealth: Payer: Self-pay | Admitting: *Deleted

## 2016-06-19 DIAGNOSIS — D649 Anemia, unspecified: Secondary | ICD-10-CM

## 2016-06-19 LAB — URINE CULTURE

## 2016-06-19 NOTE — Telephone Encounter (Signed)
Referral placed a [facility] they will call pt to schedule.

## 2016-06-19 NOTE — Telephone Encounter (Signed)
-----   Message from Ramond Craver, [place] sent at 06/18/2016  3:48 PM EDT ----- Regarding: hematology referrral Notes Recorded by Terrance Mass, MD on 06/18/2016 at 9:06 AM EDT Please inform patient that she continues to be anemic with a hemoglobin 9.2 normal is 11.7-15.5 g/DL. When she was here in the office she was stating she was having normal menstrual cycles on her oral contraceptive pill. I would like to refer her to the hematologist for further evaluation. Please make an appointment for her to see the hematologist oncologist for further evaluation.   Patient informed. She knows you will handle referral. Thanks!!!!

## 2016-06-21 ENCOUNTER — Telehealth: Payer: Self-pay | Admitting: Hematology

## 2016-06-21 ENCOUNTER — Encounter: Payer: Self-pay | Admitting: Hematology

## 2016-06-21 NOTE — Telephone Encounter (Signed)
Appt scheduled with Kale for 11/6 at 11am. Pt aware to arrive 30 minutes early. Demographics verified. Letter mailed to the patient.

## 2016-06-24 NOTE — Telephone Encounter (Signed)
Appointment on 07/22/16

## 2016-06-25 ENCOUNTER — Telehealth: Payer: Self-pay | Admitting: *Deleted

## 2016-06-25 MED ORDER — NORGESTIM-ETH ESTRAD TRIPHASIC 0.18/0.215/0.25 MG-25 MCG PO TABS: 1.0000 | ORAL_TABLET | Freq: Every day | ORAL | 11 refills | Status: DC

## 2016-06-25 NOTE — Telephone Encounter (Signed)
Pt birth control pills were never sent from office visit on 06/17/16. tinessa lo will be sent.

## 2016-07-02 ENCOUNTER — Ambulatory Visit (INDEPENDENT_AMBULATORY_CARE_PROVIDER_SITE_OTHER): Payer: BLUE CROSS/BLUE SHIELD | Admitting: Family Medicine

## 2016-07-02 VITALS — BP 132/80 | HR 83 | Temp 98.3°F | Ht 63.0 in | Wt 283.0 lb

## 2016-07-02 DIAGNOSIS — R030 Elevated blood-pressure reading, without diagnosis of hypertension: Secondary | ICD-10-CM | POA: Diagnosis not present

## 2016-07-02 NOTE — Patient Instructions (Signed)
Try to lose some weight Low sodium diet.   Regular aerobic exercise such as walking.

## 2016-07-02 NOTE — Progress Notes (Signed)
Subjective:     Patient ID: KC PALMS, female   DOB: 1976/03/18, 40 y.o.   MRN: [id number]  HPI  Patient seen for follow-up elevated blood pressure. Refer to previous note. She basically had intolerance with 3 different drugs with amlodipine, valsartan and, and Plendil. We agreed to monitoring her blood pressure for now and recommended weight loss. Unfortunately she has gained 5 pounds from last visit. No consistent exercise. Tries to watch her salt intake. No alcohol use. No headaches or dizziness. No peripheral edema.  Past Medical History:  Diagnosis Date  . Anemia   . HYPERLIPIDEMIA 10/11/2009  . HYPERTENSION 09/07/2009  . Kidney stones   . Renal disorder    Past Surgical History:  Procedure Laterality Date  . BREAST REDUCTION SURGERY  1999  . CESAREAN SECTION  2004    reports that she has never smoked. She has never used smokeless tobacco. She reports that she does not drink alcohol or use drugs. family history includes Diabetes in her mother; Hypertension in her father and mother; Lung cancer in her maternal grandfather; Prostate cancer in her maternal uncle; Stomach cancer in her maternal uncle and paternal grandmother; Thyroid disease in her maternal uncle. Allergies  Allergen Reactions  . Amlodipine Nausea Only  . Collagenase Hives  . Aspirin Rash    Review of Systems  Constitutional: Negative for fatigue and unexpected weight change.  Eyes: Negative for visual disturbance.  Respiratory: Negative for cough, chest tightness, shortness of breath and wheezing.   Cardiovascular: Negative for chest pain, palpitations and leg swelling.  Endocrine: Negative for polydipsia and polyuria.  Neurological: Negative for dizziness, seizures, syncope, weakness, light-headedness and headaches.       Objective:   Physical Exam  Constitutional: She appears well-developed and well-nourished.  Cardiovascular: Normal rate and regular rhythm.   Pulmonary/Chest: Effort normal and  breath sounds normal. No respiratory distress. She has no wheezes. She has no rales.  Musculoskeletal: She exhibits no edema.       Assessment:     Elevated blood pressure. Repeat right arm seated large cuff 132/80    Plan:     -strongly advise weight loss -Establish more consistent exercise -Watch sodium intake-no more than 3 g daily -Follow-up in 6 months and reassess then. She is encouraged to consider home blood pressure monitor  Eulas Post MD [facility] at [facility]

## 2016-07-02 NOTE — Progress Notes (Signed)
Pre visit review using our clinic review tool, if applicable. No additional management support is needed unless otherwise documented below in the visit note. 

## 2016-07-10 ENCOUNTER — Ambulatory Visit (INDEPENDENT_AMBULATORY_CARE_PROVIDER_SITE_OTHER): Payer: BLUE CROSS/BLUE SHIELD | Admitting: Gynecology

## 2016-07-10 ENCOUNTER — Encounter: Payer: Self-pay | Admitting: Gynecology

## 2016-07-10 ENCOUNTER — Ambulatory Visit (INDEPENDENT_AMBULATORY_CARE_PROVIDER_SITE_OTHER): Payer: BLUE CROSS/BLUE SHIELD

## 2016-07-10 ENCOUNTER — Other Ambulatory Visit: Payer: Self-pay | Admitting: Gynecology

## 2016-07-10 DIAGNOSIS — D500 Iron deficiency anemia secondary to blood loss (chronic): Secondary | ICD-10-CM | POA: Diagnosis not present

## 2016-07-10 DIAGNOSIS — D251 Intramural leiomyoma of uterus: Secondary | ICD-10-CM | POA: Diagnosis not present

## 2016-07-10 DIAGNOSIS — D252 Subserosal leiomyoma of uterus: Secondary | ICD-10-CM

## 2016-07-10 DIAGNOSIS — N942 Vaginismus: Secondary | ICD-10-CM

## 2016-07-10 LAB — IRON AND TIBC
%SAT: 7 % — ABNORMAL LOW (ref 11–50)
Iron: 28 ug/dL — ABNORMAL LOW (ref 40–190)
TIBC: 428 ug/dL (ref 250–450)
UIBC: 400 ug/dL (ref 125–400)

## 2016-07-10 LAB — FOLATE: Folate: 10.7 ng/mL (ref >5.4)

## 2016-07-10 LAB — FERRITIN: Ferritin: 13 ng/mL (ref 10–154)

## 2016-07-10 LAB — VITAMIN B12: Vitamin B-12: 416 pg/mL (ref 200–1100)

## 2016-07-10 NOTE — Addendum Note (Signed)
Addended by: Terrance Mass on: 07/10/2016 01:12 PM   Modules accepted: Orders

## 2016-07-10 NOTE — Patient Instructions (Signed)
Anemia, Nonspecific Anemia is a condition in which the concentration of red blood cells or hemoglobin in the blood is below normal. Hemoglobin is a substance in red blood cells that carries oxygen to the tissues of the body. Anemia results in not enough oxygen reaching these tissues.  CAUSES  Common causes of anemia include:   Excessive bleeding. Bleeding may be internal or external. This includes excessive bleeding from periods (in women) or from the intestine.   Poor nutrition.   Chronic kidney, thyroid, and liver disease.  Bone marrow disorders that decrease red blood cell production.  Cancer and treatments for cancer.  HIV, AIDS, and their treatments.  Spleen problems that increase red blood cell destruction.  Blood disorders.  Excess destruction of red blood cells due to infection, medicines, and autoimmune disorders. SIGNS AND SYMPTOMS   Minor weakness.   Dizziness.   Headache.  Palpitations.   Shortness of breath, especially with exercise.   Paleness.  Cold sensitivity.  Indigestion.  Nausea.  Difficulty sleeping.  Difficulty concentrating. Symptoms may occur suddenly or they may develop slowly.  DIAGNOSIS  Additional blood tests are often needed. These help your health care provider determine the best treatment. Your health care provider will check your stool for blood and look for other causes of blood loss.  TREATMENT  Treatment varies depending on the cause of the anemia. Treatment can include:   Supplements of iron, vitamin B12, or folic acid.   Hormone medicines.   A blood transfusion. This may be needed if blood loss is severe.   Hospitalization. This may be needed if there is significant continual blood loss.   Dietary changes.  Spleen removal. HOME CARE INSTRUCTIONS Keep all follow-up appointments. It often takes many weeks to correct anemia, and having your health care provider check on your condition and your response to  treatment is very important. SEEK IMMEDIATE MEDICAL CARE IF:   You develop extreme weakness, shortness of breath, or chest pain.   You become dizzy or have trouble concentrating.  You develop heavy vaginal bleeding.   You develop a rash.   You have bloody or black, tarry stools.   You faint.   You vomit up blood.   You vomit repeatedly.   You have abdominal pain.  You have a fever or persistent symptoms for more than 2-3 days.   You have a fever and your symptoms suddenly get worse.   You are dehydrated.  MAKE SURE YOU:  Understand these instructions.  Will watch your condition.  Will get help right away if you are not doing well or get worse.   This information is not intended to replace advice given to you by your health care provider. Make sure you discuss any questions you have with your health care provider.   Document Released: 10/10/2004 Document Revised: 05/05/2013 Document Reviewed: 02/26/2013 Elsevier Interactive Patient Education 2016 [employer] Inc[employer]  

## 2016-07-10 NOTE — Progress Notes (Signed)
   Patient is a 40 year old that presented to the office today to discuss her ultrasound to the fact that time of her annual exam due to her morbid obesity and vaginismus her pelvic exam was incomplete. Patient is on oral contraceptive pills and reports a menstrual cycle lasting 5 days and normal. Review of her record indicated that she is anemic and has been running hemoglobins in the 9.2-9.7 range as follows:  Results for REMONICA, HAYCOX (MRN [id number]) as of 07/10/2016 13:05  Ref. Range 10/15/2015 00:41 12/26/2015 08:00 01/18/2016 08:15 06/17/2016 12:43  WBC Latest Ref Range: 3.8 - 10.8 K/uL 16.6 (H) 14.0 (H)  12.1 (H)  RBC Latest Ref Range: 3.80 - 5.10 MIL/uL 4.70 4.44  4.50  Hemoglobin Latest Ref Range: 11.7 - 15.5 g/dL 9.7 (L) 9.2 (L)  9.2 (L)  HCT Latest Ref Range: 35.0 - 45.0 % 29.3 (L) 27.7 (L)  29.1 (L)  MCV Latest Ref Range: 80.0 - 100.0 fL 62.3 (L) 62.4 (L)  64.7 (L)  MCH Latest Ref Range: 27.0 - 33.0 pg 20.6 (L) 20.7 (L)  20.4 (L)  MCHC Latest Ref Range: 32.0 - 36.0 g/dL 33.1 33.2  31.6 (L)  RDW Latest Ref Range: 11.0 - 15.0 % 18.8 (H) 18.4 (H)  20.0 (H)  Platelets Latest Ref Range: 140 - 400 K/uL 441 (H) 414 (H)  431 (H)    Patient's ultrasound demonstrated the following: Uterus measured 9.2 x 7.7 x 5.0 cm with endometrial stripe of 5.1 mm. Right and left ovary were normal no adnexal masses. She had a small subserosal fibroid measuring 2.2 x 2.3 cm.  Assessment/plan: Patient with chronic anemia I have provided her with fecal Hemoccult cards to submit to the office for testing. We have made an appointment for her to see the hematologist on November 7. She was encouraged to take one iron tablet daily. Small insignificant fibroid not the cause of her anemia. Patient well compensated has been asymptomatic otherwise. I've ordered a complete anemia panel today which should be available when she has her appointment with the hematologist.

## 2016-07-11 ENCOUNTER — Telehealth: Payer: Self-pay

## 2016-07-11 NOTE — Telephone Encounter (Signed)
-----   Message from Terrance Mass, MD sent at 07/11/2016 11:00 AM EDT ----- Please make sure patient takes one iron tablet daily and that she has a follow-up appointment with the hematologist

## 2016-07-11 NOTE — Telephone Encounter (Signed)
I responded to a previous lab result. Iron deficiency anemia. But I still want her to see the hematologist.

## 2016-07-11 NOTE — Telephone Encounter (Signed)
Patient said that you told her the tests you were running would tell you what type of anemia she had.  Please advise.

## 2016-07-11 NOTE — Telephone Encounter (Signed)
Patient informed. 

## 2016-07-22 ENCOUNTER — Encounter: Payer: BLUE CROSS/BLUE SHIELD | Admitting: Hematology

## 2016-10-10 ENCOUNTER — Other Ambulatory Visit: Payer: BLUE CROSS/BLUE SHIELD

## 2016-10-18 ENCOUNTER — Other Ambulatory Visit: Payer: BLUE CROSS/BLUE SHIELD

## 2016-12-27 ENCOUNTER — Encounter: Payer: Self-pay | Admitting: Family Medicine

## 2016-12-27 ENCOUNTER — Ambulatory Visit (INDEPENDENT_AMBULATORY_CARE_PROVIDER_SITE_OTHER): Payer: BLUE CROSS/BLUE SHIELD | Admitting: Family Medicine

## 2016-12-27 VITALS — BP 130/84 | HR 88 | Temp 98.3°F | Wt 276.6 lb

## 2016-12-27 DIAGNOSIS — J0100 Acute maxillary sinusitis, unspecified: Secondary | ICD-10-CM | POA: Diagnosis not present

## 2016-12-27 MED ORDER — AMOXICILLIN 875 MG PO TABS: 875.0000 mg | ORAL_TABLET | Freq: Two times a day (BID) | ORAL | 0 refills | Status: DC

## 2016-12-27 MED ORDER — HYDROCOD POLST-CPM POLST ER 10-8 MG/5ML PO SUER: 5.0000 mL | Freq: Two times a day (BID) | ORAL | 0 refills | Status: DC | PRN

## 2016-12-27 NOTE — Progress Notes (Signed)
Pre visit review using our clinic review tool, if applicable. No additional management support is needed unless otherwise documented below in the visit note. 

## 2016-12-27 NOTE — Patient Instructions (Addendum)

## 2016-12-27 NOTE — Progress Notes (Signed)
Subjective:     Patient ID: Ashley Henry, female   DOB: 20-Jun-1976, 41 y.o.   MRN: [id number]  HPI Patient seen with three-week history of cough and nasal drainage. She's had some cough productive of brown sputum and also some colored nasal discharge. Left facial pain. Increased malaise. Intermittent headaches. She tried Benadryl, Delsym, and Robitussin without relief. Her cough is fairly severe at night at times. Patient has never smoked. No dyspnea.  Past Medical History:  Diagnosis Date  . Anemia   . HYPERLIPIDEMIA 10/11/2009  . HYPERTENSION 09/07/2009  . Kidney stones   . Renal disorder    Past Surgical History:  Procedure Laterality Date  . BREAST REDUCTION SURGERY  1999  . CESAREAN SECTION  2004    reports that she has never smoked. She has never used smokeless tobacco. She reports that she does not drink alcohol or use drugs. family history includes Diabetes in her mother; Hypertension in her father and mother; Lung cancer in her maternal grandfather; Prostate cancer in her maternal uncle; Stomach cancer in her maternal uncle and paternal grandmother; Thyroid disease in her maternal uncle. Allergies  Allergen Reactions  . Amlodipine Nausea Only  . Collagenase Hives  . Aspirin Rash     Review of Systems  Constitutional: Positive for fatigue. Negative for chills and fever.  HENT: Positive for postnasal drip, sinus pain and sinus pressure. Negative for ear pain and facial swelling.   Respiratory: Positive for cough. Negative for shortness of breath and wheezing.   Neurological: Positive for headaches.       Objective:   Physical Exam  Constitutional: She appears well-developed and well-nourished. No distress.  HENT:  Right Ear: External ear normal.  Left Ear: External ear normal.  Mouth/Throat: Oropharynx is clear and moist.  Neck: Neck supple.  Cardiovascular: Normal rate and regular rhythm.   Pulmonary/Chest: Effort normal and breath sounds normal. No respiratory  distress. She has no wheezes. She has no rales.  Lymphadenopathy:    She has no cervical adenopathy.       Assessment:     Probable acute left maxillary sinusitis    Plan:     -Amoxicillin 875 mg twice daily for 10 days -Tussionex 1 teaspoon daily at bedtime for severe cough -Follow-up as needed for any persistent or worsening symptoms  Eulas Post MD [facility] at [facility]

## 2017-01-03 ENCOUNTER — Ambulatory Visit: Payer: BLUE CROSS/BLUE SHIELD | Admitting: Family Medicine

## 2017-01-14 ENCOUNTER — Encounter: Payer: Self-pay | Admitting: *Deleted

## 2017-01-29 ENCOUNTER — Encounter: Payer: Self-pay | Admitting: Gynecology

## 2017-05-28 ENCOUNTER — Other Ambulatory Visit: Payer: Self-pay

## 2017-05-28 MED ORDER — NORGESTIM-ETH ESTRAD TRIPHASIC 0.18/0.215/0.25 MG-25 MCG PO TABS: 1.0000 | ORAL_TABLET | Freq: Every day | ORAL | 1 refills | Status: DC

## 2017-07-10 ENCOUNTER — Encounter: Payer: BLUE CROSS/BLUE SHIELD | Admitting: Gynecology

## 2017-07-21 ENCOUNTER — Encounter: Payer: Self-pay | Admitting: Family Medicine

## 2017-07-21 ENCOUNTER — Ambulatory Visit: Payer: BLUE CROSS/BLUE SHIELD | Admitting: Family Medicine

## 2017-07-21 VITALS — BP 136/80 | HR 88 | Temp 98.4°F | Ht 63.0 in | Wt 279.3 lb

## 2017-07-21 DIAGNOSIS — L723 Sebaceous cyst: Secondary | ICD-10-CM | POA: Diagnosis not present

## 2017-07-21 MED ORDER — CEPHALEXIN 500 MG PO CAPS: 500.0000 mg | ORAL_CAPSULE | Freq: Three times a day (TID) | ORAL | 0 refills | Status: DC

## 2017-07-21 NOTE — Progress Notes (Signed)
Subjective:     Patient ID: Ashley Henry, female   DOB: 1976/04/15, 41 y.o.   MRN: [id number]  HPI Patient seen with couple week history of some soreness and swelling between the breasts mid chest wall area. Earlier today she thinks she may of express a small amount of pus. Denies any fevers or chills. She's had some mild pain involving the medial aspect of the left breast. No nipple discharge. No breast masses otherwise palpated. No axillary adenopathy.  Past Medical History:  Diagnosis Date  . Anemia   . HYPERLIPIDEMIA 10/11/2009  . HYPERTENSION 09/07/2009  . Kidney stones   . Renal disorder    Past Surgical History:  Procedure Laterality Date  . BREAST REDUCTION SURGERY  1999  . CESAREAN SECTION  2004    reports that  has never smoked. she has never used smokeless tobacco. She reports that she does not drink alcohol or use drugs. family history includes Diabetes in her mother; Hypertension in her father and mother; Lung cancer in her maternal grandfather; Prostate cancer in her maternal uncle; Stomach cancer in her maternal uncle and paternal grandmother; Thyroid disease in her maternal uncle. Allergies  Allergen Reactions  . Amlodipine Nausea Only  . Collagenase Hives  . Aspirin Rash     Review of Systems  Constitutional: Negative for chills and fever.       Objective:   Physical Exam  Constitutional: She appears well-developed and well-nourished.  Cardiovascular: Normal rate and regular rhythm.  Pulmonary/Chest: Effort normal and breath sounds normal. No respiratory distress. She has no wheezes. She has no rales.  No breast masses palpated  Skin:  Patient has cystic swelling mid chest wall midway between the right and left breast. No overlying erythema. Minimally tender to palpation. Firm and nonfluctuant       Assessment:     Probable sebaceous cyst anterior chest wall. She has some irritation which may suggest mild early infection but at this point no significant  erythema or fluctuance    Plan:     -Recommend warm compresses several times daily -Start Keflex 500 mg 3 times a day -Reassess in 2 days. Follow-up sooner for any fever, increase fluctuance, increased swelling, redness, or other concerns  Eulas Post MD [facility] at [facility]

## 2017-07-21 NOTE — Patient Instructions (Signed)
You have a likely sebaceous cyst of the mid chest wall. Try some warm compresses several times daily This may need to be drained- watch for progressive swelling, redness, warmth, or fluctuance.

## 2017-07-23 ENCOUNTER — Ambulatory Visit: Payer: BLUE CROSS/BLUE SHIELD | Admitting: Family Medicine

## 2017-07-23 ENCOUNTER — Encounter: Payer: Self-pay | Admitting: Family Medicine

## 2017-07-23 VITALS — BP 120/88 | Temp 98.3°F | Wt 279.2 lb

## 2017-07-23 DIAGNOSIS — R222 Localized swelling, mass and lump, trunk: Secondary | ICD-10-CM

## 2017-07-23 MED ORDER — AMOXICILLIN-POT CLAVULANATE 875-125 MG PO TABS: 1.0000 | ORAL_TABLET | Freq: Two times a day (BID) | ORAL | 0 refills | Status: DC

## 2017-07-23 NOTE — Patient Instructions (Signed)
Continue with warm compresses several times daily Stop the Keflex and may start the Augmentin Will set up surgical referral.

## 2017-07-23 NOTE — Progress Notes (Signed)
Subjective:     Patient ID: Ashley Henry, female   DOB: October 01, 1975, 41 y.o.   MRN: [id number]  HPI Patient seen for follow-up regarding cystic swelling anterior chest wall between the breasts. We suspected sebaceous cyst. She describes some purulent drainage though we cannot appreciate any cellulitis changes- or drainage at this time..  . No significant fluctuance. We advised warm compresses and started Keflex 500 mg 3 times a day. She states she's had some stomach upset and loose stools with Keflex. She's tolerated Augmentin without difficulty in the past.  Past Medical History:  Diagnosis Date  . Anemia   . HYPERLIPIDEMIA 10/11/2009  . HYPERTENSION 09/07/2009  . Kidney stones   . Renal disorder    Past Surgical History:  Procedure Laterality Date  . BREAST REDUCTION SURGERY  1999  . CESAREAN SECTION  2004    reports that  has never smoked. she has never used smokeless tobacco. She reports that she does not drink alcohol or use drugs. family history includes Diabetes in her mother; Hypertension in her father and mother; Lung cancer in her maternal grandfather; Prostate cancer in her maternal uncle; Stomach cancer in her maternal uncle and paternal grandmother; Thyroid disease in her maternal uncle. Allergies  Allergen Reactions  . Amlodipine Nausea Only  . Collagenase Hives  . Aspirin Rash     Review of Systems  Constitutional: Negative for chills and fever.       Objective:   Physical Exam  Constitutional: She appears well-developed and well-nourished.  Cardiovascular: Normal rate and regular rhythm.  Pulmonary/Chest: Effort normal and breath sounds normal. No respiratory distress. She has no wheezes. She has no rales.  Skin:  Subcutaneous cystic type swelling mid anterior chest between the breast region. Minimal fluctuance. No erythema or warmth. Minimally tender.       Assessment:     Probable sebaceous cyst anterior chest wall. No overlying cellulitis changes     Plan:     -Set up referral to general surgery for further evaluation. Patient is requesting excision  Eulas Post MD [facility] at [facility]

## 2017-07-24 ENCOUNTER — Other Ambulatory Visit: Payer: Self-pay | Admitting: Gynecology

## 2017-07-24 LAB — HM MAMMOGRAPHY

## 2017-07-24 NOTE — Telephone Encounter (Signed)
Annual on 08/26/17

## 2017-08-14 ENCOUNTER — Other Ambulatory Visit: Payer: Self-pay | Admitting: Surgery

## 2017-08-18 ENCOUNTER — Other Ambulatory Visit: Payer: Self-pay | Admitting: Surgery

## 2017-08-26 ENCOUNTER — Encounter: Payer: BLUE CROSS/BLUE SHIELD | Admitting: Gynecology

## 2017-09-05 ENCOUNTER — Ambulatory Visit: Payer: BLUE CROSS/BLUE SHIELD | Admitting: Gynecology

## 2017-09-05 ENCOUNTER — Encounter: Payer: Self-pay | Admitting: Gynecology

## 2017-09-05 VITALS — BP 122/78 | Ht 64.0 in | Wt 279.0 lb

## 2017-09-05 DIAGNOSIS — Z01419 Encounter for gynecological examination (general) (routine) without abnormal findings: Secondary | ICD-10-CM | POA: Diagnosis not present

## 2017-09-05 DIAGNOSIS — Z13220 Encounter for screening for lipoid disorders: Secondary | ICD-10-CM | POA: Diagnosis not present

## 2017-09-05 MED ORDER — ORTHO TRI-CYCLEN LO 0.18/0.215/0.25 MG-25 MCG PO TABS: 1.0000 | ORAL_TABLET | Freq: Every day | ORAL | 12 refills | Status: DC

## 2017-09-05 NOTE — Patient Instructions (Signed)
Follow-up for screening blood work as arranged  Follow-up for annual exam in 1 year

## 2017-09-05 NOTE — Progress Notes (Signed)
    Ashley Henry [birth year] [id number]        41 y.o.  G2P0011 for annual gynecologic exam.  Former patient of Dr. Toney Rakes.  On oral contraceptives doing well without complaints.  Does have a history of anemia.  Having regular monthly menses.  Past medical history,surgical history, problem list, medications, allergies, family history and social history were all reviewed and documented as reviewed in the EPIC chart.  ROS:  Performed with pertinent positives and negatives included in the history, assessment and plan.   Additional significant findings : None   Exam: [name] assistant Vitals:   09/05/17 1445  BP: 122/78  Weight: 279 lb (126.6 kg)  Height: 5\' 4"  (1.626 m)   Body mass index is 47.89 kg/m.  General appearance:  Normal affect, orientation and appearance. Skin: Grossly normal HEENT: Without gross lesions.  No cervical or supraclavicular adenopathy. Thyroid normal.  Lungs:  Clear without wheezing, rales or rhonchi Cardiac: RR, without RMG Abdominal:  Soft, nontender, without masses, guarding, rebound, organomegaly or hernia Breasts:  Examined lying and sitting without masses, retractions, discharge or axillary adenopathy.  Well-healed bilateral reduction scars. Pelvic:  Ext, BUS, Vagina: Normal  Cervix: Normal  Uterus: Grossly normal midline mobile nontender  Adnexa: Without gross masses or tenderness    Anus and perineum: Normal   Rectovaginal: Normal sphincter tone without palpated masses or tenderness.    Assessment/Plan:  41 y.o. G27P0011 female for annual gynecologic exam with regular menses, oral contraceptives.   1. Oral contraceptives.  Doing well wants to continue.  Refill times 1 year provided.  Never smoked.  Blood pressure normal.  Glucose is running normal historically.  Ortho Tri-Cyclen Lo refilled times 1 year. 2. Pap smear/HPV 05/2014 normal.  No Pap smear done today.  No history of significant abnormal Pap smears.  Plan repeat Pap smear at 5-year  interval per current screening guidelines. 3. Mammography reported last year.  Recommend follow-up mammogram next year when she turns 40.  Breast exam normal today.  SBE monthly reviewed. 4. Health maintenance.  Patient requests baseline labs.  Will return fasting for CBC, CMP, hemoglobin A1c, lipid profile, TSH.  Follow-up 1 year, sooner as needed.   [name] MD, 3:09 PM 09/05/2017

## 2017-09-06 LAB — URINALYSIS W MICROSCOPIC + REFLEX CULTURE
Bacteria, UA: NONE SEEN /HPF
Bilirubin Urine: NEGATIVE
Glucose, UA: NEGATIVE
Hgb urine dipstick: NEGATIVE
Hyaline Cast: NONE SEEN /LPF
Ketones, ur: NEGATIVE
Leukocyte Esterase: NEGATIVE
Nitrites, Initial: NEGATIVE
Specific Gravity, Urine: 1.018 (ref 1.001–1.03)
WBC, UA: NONE SEEN /HPF (ref 0–5)
pH: 6 (ref 5.0–8.0)

## 2017-09-06 LAB — NO CULTURE INDICATED

## 2017-09-17 ENCOUNTER — Other Ambulatory Visit: Payer: Self-pay | Admitting: Gynecology

## 2017-09-26 ENCOUNTER — Ambulatory Visit: Payer: BLUE CROSS/BLUE SHIELD | Admitting: Internal Medicine

## 2017-09-26 ENCOUNTER — Encounter: Payer: Self-pay | Admitting: Family Medicine

## 2017-09-26 ENCOUNTER — Ambulatory Visit: Payer: BLUE CROSS/BLUE SHIELD | Admitting: Family Medicine

## 2017-09-26 VITALS — BP 130/90 | HR 74 | Temp 98.2°F | Wt 275.2 lb

## 2017-09-26 DIAGNOSIS — K1120 Sialoadenitis, unspecified: Secondary | ICD-10-CM | POA: Diagnosis not present

## 2017-09-26 DIAGNOSIS — R682 Dry mouth, unspecified: Secondary | ICD-10-CM

## 2017-09-26 LAB — COMPREHENSIVE METABOLIC PANEL
ALT: 15 U/L (ref 0–35)
AST: 15 U/L (ref 0–37)
Albumin: 3.7 g/dL (ref 3.5–5.2)
Alkaline Phosphatase: 80 U/L (ref 39–117)
BUN: 11 mg/dL (ref 6–23)
CO2: 30 mEq/L (ref 19–32)
Calcium: 9.3 mg/dL (ref 8.4–10.5)
Chloride: 100 mEq/L (ref 96–112)
Creatinine, Ser: 1.14 mg/dL (ref 0.40–1.20)
GFR: 67.96 mL/min (ref >60.00)
Glucose, Bld: 102 mg/dL — ABNORMAL HIGH (ref 70–99)
Potassium: 4 mEq/L (ref 3.5–5.1)
Sodium: 138 mEq/L (ref 135–145)
Total Bilirubin: 0.3 mg/dL (ref 0.2–1.2)
Total Protein: 7.8 g/dL (ref 6.0–8.3)

## 2017-09-26 LAB — CBC
HCT: 30.3 % — ABNORMAL LOW (ref 36.0–46.0)
Hemoglobin: 9.6 g/dL — ABNORMAL LOW (ref 12.0–15.0)
MCHC: 31.6 g/dL (ref 30.0–36.0)
MCV: 65.7 fl — ABNORMAL LOW (ref 78.0–100.0)
Platelets: 378 10*3/uL (ref 150.0–400.0)
RBC: 4.62 Mil/uL (ref 3.87–5.11)
RDW: 20.2 % — ABNORMAL HIGH (ref 11.5–15.5)
WBC: 9.6 10*3/uL (ref 4.0–10.5)

## 2017-09-26 LAB — SEDIMENTATION RATE: Sed Rate: 47 mm/hr — ABNORMAL HIGH (ref 0–20)

## 2017-09-26 NOTE — Progress Notes (Signed)
Subjective:     Patient ID: Ashley Henry, female   DOB: 02-12-1976, 42 y.o.   MRN: [id number]  HPI Patient seen with chief complaint of bilateral facial swelling for the past approximately 3 days. She has some swelling in her parotid region bilaterally. Denies any recent acute illness. No recent upper respiratory symptoms. No nausea or vomiting. No cough. No dyspnea. She does note that she's had dry mouth for at least a month. She is not aware of any family history of Sjogren syndrome. No relation of swelling to eating.  She's not noted any adenopathy. No skin rash. Generally feels well. She states she has basically no risk factor for HIV and declines testing. She does state several family members have history of sarcoidosis. She again has not had any cough or dyspnea or other concerning symptoms.  . Past Medical History:  Diagnosis Date  . Anemia   . HYPERLIPIDEMIA 10/11/2009  . HYPERTENSION 09/07/2009  . Kidney stones   . Renal disorder    Past Surgical History:  Procedure Laterality Date  . BREAST REDUCTION SURGERY  1999  . CESAREAN SECTION  2004  . Cysts removed from chest wall      reports that  has never smoked. she has never used smokeless tobacco. She reports that she does not drink alcohol or use drugs. family history includes Diabetes in her mother; Hypertension in her father and mother; Lung cancer in her maternal grandfather; Prostate cancer in her maternal uncle; Stomach cancer in her maternal uncle and paternal grandmother; Thyroid disease in her maternal uncle. Allergies  Allergen Reactions  . Amlodipine Nausea Only  . Collagenase Hives  . Aspirin Rash     Review of Systems  Constitutional: Negative for appetite change, chills, fever and unexpected weight change.  HENT: Negative for congestion and sore throat.   Respiratory: Negative for cough, shortness of breath and wheezing.   Cardiovascular: Negative for chest pain and leg swelling.  Neurological: Negative for  dizziness and headaches.  Hematological: Negative for adenopathy.       Objective:   Physical Exam  Constitutional: She appears well-developed and well-nourished.  HENT:  Right Ear: External ear normal.  Left Ear: External ear normal.  Mouth/Throat: Oropharynx is clear and moist. No oropharyngeal exudate.  Patient does have some bilateral parotid prominence. There is no tenderness. No warmth. No erythema  Neck: Neck supple.  Cardiovascular: Normal rate.  Pulmonary/Chest: Effort normal and breath sounds normal. No respiratory distress. She has no wheezes. She has no rales.  Musculoskeletal: She exhibits no edema.  Lymphadenopathy:    She has no cervical adenopathy.       Assessment:     Bilateral parotid swelling. She denies any prodrome of viral type symptoms to suggest likely adenovirus, etc. Does complain of dry mouth and rule out Sjogren's syndrome. Strong family history of sarcoidosis but no other concerning symptoms. She has no erythema or warmth to suggest bacterial parotitis.    Plan:     -Check Sjogren's antibodies -Discussed complexities of diagnosing things like sarcoidosis. We elected to go ahead and get some basic labs with CBC, sedimentation rate, comprehensive metabolic panel (rule out hypercalcemia), and chest x-ray. -Discussed limitations with both false positives and false negatives of getting ACE levels -Stressed importance of staying well hydrated -pt declines HIV testing. -Follow-up promptly for any new symptoms  Eulas Post MD [facility] at [facility]

## 2017-09-26 NOTE — Patient Instructions (Signed)
Parotitis Parotitis is irritation and swelling (inflammation) of one or both of your parotid glands. These glands produce saliva. They are found on each side of your face, below and in front of your earlobes. The saliva that they produce comes out of tiny openings (ducts) inside your cheeks. Parotitis may cause sudden swelling and pain (acute parotitis). It can also cause repeated episodes of swelling and pain or continued swelling that may or may not be painful (chronic parotitis). What are the causes? Causes of this condition include:  Bacterial infections.  Viral infections, such as mumps and HIV.  Blockage (obstruction) of saliva flow through the parotid glands. This can be from a stone, scar tissue, or tumor.  Diseases that cause your body's defense system to attack salivary glands and cause inflammation (autoimmune diseases).  What increases the risk? This condition is more likely to develop in:  People who are 50 or older.  People who do not drink enough fluids (dehydration).  People who drink too much alcohol.  People who have a dry mouth.  People who have poor dental hygiene.  People who have diabetes.  People who have gout.  People who have a long-term illness.  People who have had X-ray treatments to the head and neck.  People who take certain medicines.  What are the signs or symptoms? Symptoms of this condition depend on the cause. Symptoms may include:  Swelling under and in front of the ear. This may get worse after eating.  Redness of the skin over the parotid gland.  Pain and tenderness over the parotid gland. This may get worse after eating.  Fever or chills.  Pus coming from the ducts inside the mouth.  Dry mouth.  A bad taste in the mouth.  How is this diagnosed? This condition is diagnosed with a medical history and physical exam. You may also have tests to find the cause of parotitis. These tests may include:  Doing blood tests to check  for autoimmune disease or a viral infection.  Taking a sample of fluid from the parotid gland to test for infection.  Injecting the ducts of the gland with a dye before taking X-rays (sialogram).  Having other imaging studies of the gland, including X-rays, ultrasound, MRI, or CT scan.  Checking the opening of the gland for a stone or obstruction.  Placing a needle into the gland to remove tissue for a biopsy (fine needle aspiration).  How is this treated? Treatment for this condition depends on the cause. Treatment may include:  Antibiotic medicine for a bacterial infection.  Drinking more fluids.  Removing a stone or obstruction.  Treating an underlying disease that is causing parotitis.  Surgery to drain an infection, remove a growth, or remove the whole gland (parotidectomy).  Treatment may not be needed if parotid swelling goes away with home care. Follow these instructions at home: Medicines  Take over-the-counter and prescription medicines only as told by your health care provider.  If you were prescribed an antibiotic, take it as told by your health care provider. Do not stop taking the antibiotic even if you start to feel better. Managing pain and swelling  Apply warm compresses to the affected area as told by your health care provider.  Gently massage the parotid glands as told by your health care provider. General instructions   Drink enough fluid to keep your urine clear or pale yellow.  Try sucking on sour candy. This may help to make your mouth less dry and   may stimulate the flow of saliva.  Keep your mouth clean and moist. Gargle with a salt-water mixture 3-4 times per day, or as needed. To make a salt-water mixture, completely dissolve -1 tsp of salt in 1 cup of warm water.  Maintain good oral health. ? Brush your teeth at least two times per day. ? Floss your teeth every day. ? See your dentist regularly.  Do not use tobacco products, including  cigarettes, chewing tobacco, or e-cigarettes. If you need help quitting, ask your health care provider.  Keep all follow-up visits as told by your health care provider. This is important. Contact a health care provider if:  You have a fever or chills.  You have new symptoms.  Your symptoms get worse.  Your symptoms do not improve with treatment. This information is not intended to replace advice given to you by your health care provider. Make sure you discuss any questions you have with your health care provider. Document Released: 02/22/2002 Document Revised: 02/08/2016 Document Reviewed: 01/26/2015 Elsevier Interactive Patient Education  2018 Elsevier Inc.  

## 2017-09-26 NOTE — Progress Notes (Deleted)
   No chief complaint on file.   HPI: Ashley Henry 42 y.o.  PCP  Apt NA   ROS: See pertinent positives and negatives per HPI.  Past Medical History:  Diagnosis Date  . Anemia   . HYPERLIPIDEMIA 10/11/2009  . HYPERTENSION 09/07/2009  . Kidney stones   . Renal disorder     Family History  Problem Relation Age of Onset  . Hypertension Mother   . Diabetes Mother   . Hypertension Father   . Stomach cancer Paternal Grandmother   . Stomach cancer Maternal Uncle   . Lung cancer Maternal Grandfather   . Prostate cancer Maternal Uncle   . Thyroid disease Maternal Uncle     Social History   Socioeconomic History  . Marital status: Married    Spouse name: [name]  . Number of children: [name]  . Years of education: [name]  . Highest education level: [name]  Social Needs  . Financial resource strain: [name]  . Food insecurity - worry: [name]  . Food insecurity - inability: [name]  . Transportation needs - medical: [name]  . Transportation needs - non-medical: [name]  Occupational History  . [name]  Tobacco Use  . Smoking status: Never Smoker  . Smokeless tobacco: Never Used  Substance and Sexual Activity  . Alcohol use: No    Alcohol/week: 0.0 oz  . Drug use: No  . Sexual activity: Yes    Birth control/protection: Pill    Comment: 1st intercourse 42 yo-Fewer than 5 partners  Other Topics Concern  . [name]  Social History Narrative  . [name]    Outpatient Medications Prior to Visit  Medication Sig Dispense Refill  . BEPREVE 1.5 % SOLN Place 1 drop into both eyes daily.  10  . ORTHO TRI-CYCLEN LO 0.18/0.215/0.25 MG-25 MCG tab Take 1 tablet by mouth daily. 28 tablet 12  . ORTHO TRI-CYCLEN LO 0.18/0.215/0.25 MG-25 MCG tab TAKE 1 TABLET BY MOUTH DAILY 28 tablet 11   No facility-administered medications prior to visit.      EXAM:  There were no vitals taken for this visit.  There is no height or weight  on file to calculate BMI.  GENERAL: vitals reviewed and listed above, alert, oriented, appears well hydrated and in no acute distress HEENT: atraumatic, conjunctiva  clear, no obvious abnormalities on inspection of external nose and ears OP : no lesion edema or exudate  NECK: no obvious masses on inspection palpation  LUNGS: clear to auscultation bilaterally, no wheezes, rales or rhonchi, good air movement CV: HRRR, no clubbing cyanosis or  peripheral edema nl cap refill  MS: moves all extremities without noticeable focal  abnormality PSYCH: pleasant and cooperative, no obvious depression or anxiety  ASSESSMENT AND PLAN:  Discussed the following assessment and plan:  No diagnosis found.  -Patient advised to return or notify health care team  if symptoms worsen ,persist or new concerns arise.  There are no Patient Instructions on file for this visit.   Standley Brooking. Maritza Hosterman M.D.

## 2017-09-29 ENCOUNTER — Encounter: Payer: Self-pay | Admitting: Family Medicine

## 2017-09-29 ENCOUNTER — Other Ambulatory Visit: Payer: BLUE CROSS/BLUE SHIELD

## 2017-09-29 ENCOUNTER — Ambulatory Visit (INDEPENDENT_AMBULATORY_CARE_PROVIDER_SITE_OTHER)
Admission: RE | Admit: 2017-09-29 | Discharge: 2017-09-29 | Disposition: A | Discharge status: Home / Self Care | Payer: BLUE CROSS/BLUE SHIELD | Source: Ambulatory Visit | Attending: Family Medicine | Admitting: Family Medicine

## 2017-09-29 DIAGNOSIS — Z01419 Encounter for gynecological examination (general) (routine) without abnormal findings: Secondary | ICD-10-CM

## 2017-09-29 DIAGNOSIS — K1120 Sialoadenitis, unspecified: Secondary | ICD-10-CM | POA: Diagnosis not present

## 2017-09-29 DIAGNOSIS — Z13220 Encounter for screening for lipoid disorders: Secondary | ICD-10-CM

## 2017-09-29 LAB — SJOGREN'S SYNDROME ANTIBODS(SSA + SSB)
SSA (Ro) (ENA) Antibody, IgG: <1 AI
SSB (La) (ENA) Antibody, IgG: <1 AI

## 2017-09-30 ENCOUNTER — Other Ambulatory Visit: Payer: Self-pay | Admitting: Gynecology

## 2017-09-30 ENCOUNTER — Other Ambulatory Visit: Payer: BLUE CROSS/BLUE SHIELD

## 2017-09-30 ENCOUNTER — Encounter: Payer: Self-pay | Admitting: Family Medicine

## 2017-09-30 ENCOUNTER — Ambulatory Visit: Payer: BLUE CROSS/BLUE SHIELD | Admitting: Family Medicine

## 2017-09-30 VITALS — BP 124/78 | HR 88 | Temp 98.3°F | Wt 273.6 lb

## 2017-09-30 DIAGNOSIS — K1120 Sialoadenitis, unspecified: Secondary | ICD-10-CM | POA: Diagnosis not present

## 2017-09-30 DIAGNOSIS — H1032 Unspecified acute conjunctivitis, left eye: Secondary | ICD-10-CM

## 2017-09-30 DIAGNOSIS — R7989 Other specified abnormal findings of blood chemistry: Secondary | ICD-10-CM

## 2017-09-30 DIAGNOSIS — R7309 Other abnormal glucose: Secondary | ICD-10-CM

## 2017-09-30 LAB — COMPREHENSIVE METABOLIC PANEL
AG Ratio: 0.9 (calc) — ABNORMAL LOW (ref 1.0–2.5)
ALT: 16 U/L (ref 6–29)
AST: 16 U/L (ref 10–30)
Albumin: 3.7 g/dL (ref 3.6–5.1)
Alkaline phosphatase (APISO): 84 U/L (ref 33–115)
BUN/Creatinine Ratio: 9 (calc) (ref 6–22)
BUN: 10 mg/dL (ref 7–25)
CO2: 27 mmol/L (ref 20–32)
Calcium: 9.1 mg/dL (ref 8.6–10.2)
Chloride: 101 mmol/L (ref 98–110)
Creat: 1.14 mg/dL — ABNORMAL HIGH (ref 0.50–1.10)
Globulin: 4 g/dL (calc) — ABNORMAL HIGH (ref 1.9–3.7)
Glucose, Bld: 91 mg/dL (ref 65–99)
Potassium: 4 mmol/L (ref 3.5–5.3)
Sodium: 137 mmol/L (ref 135–146)
Total Bilirubin: 0.3 mg/dL (ref 0.2–1.2)
Total Protein: 7.7 g/dL (ref 6.1–8.1)

## 2017-09-30 LAB — HEMOGLOBIN A1C
Hgb A1c MFr Bld: 6.2 % of total Hgb — ABNORMAL HIGH (ref <5.7)
Mean Plasma Glucose: 131 (calc)
eAG (mmol/L): 7.3 (calc)

## 2017-09-30 LAB — CBC WITH DIFFERENTIAL/PLATELET
Basophils Absolute: 50 cells/uL (ref 0–200)
Basophils Relative: 0.5 %
Eosinophils Absolute: 170 cells/uL (ref 15–500)
Eosinophils Relative: 1.7 %
HCT: 31.7 % — ABNORMAL LOW (ref 35.0–45.0)
Hemoglobin: 9.8 g/dL — ABNORMAL LOW (ref 11.7–15.5)
Lymphs Abs: 2840 cells/uL (ref 850–3900)
MCH: 20.2 pg — ABNORMAL LOW (ref 27.0–33.0)
MCHC: 30.9 g/dL — ABNORMAL LOW (ref 32.0–36.0)
MCV: 65.2 fL — ABNORMAL LOW (ref 80.0–100.0)
Monocytes Relative: 6.3 %
Neutro Abs: 6310 cells/uL (ref 1500–7800)
Neutrophils Relative %: 63.1 %
Platelets: 407 10*3/uL — ABNORMAL HIGH (ref 140–400)
RBC: 4.86 10*6/uL (ref 3.80–5.10)
RDW: 19.8 % — ABNORMAL HIGH (ref 11.0–15.0)
Total Lymphocyte: 28.4 %
WBC mixed population: 630 cells/uL (ref 200–950)
WBC: 10 10*3/uL (ref 3.8–10.8)

## 2017-09-30 LAB — LIPID PANEL
Cholesterol: 203 mg/dL — ABNORMAL HIGH (ref <200)
HDL: 46 mg/dL — ABNORMAL LOW (ref >50)
LDL Cholesterol (Calc): 137 mg/dL (calc) — ABNORMAL HIGH
Non-HDL Cholesterol (Calc): 157 mg/dL (calc) — ABNORMAL HIGH (ref <130)
Total CHOL/HDL Ratio: 4.4 (calc) (ref <5.0)
Triglycerides: 101 mg/dL (ref <150)

## 2017-09-30 LAB — TSH: TSH: 1.86 mIU/L

## 2017-09-30 MED ORDER — POLYMYXIN B-TRIMETHOPRIM 10000-0.1 UNIT/ML-% OP SOLN: 2.0000 [drp] | OPHTHALMIC | 0 refills | Status: DC

## 2017-09-30 NOTE — Patient Instructions (Signed)
Use warm compresses to left eye several times daily

## 2017-09-30 NOTE — Progress Notes (Signed)
Subjective:     Patient ID: Ashley Henry, female   DOB: [birth year], 42 y.o.   MRN: [id number]  HPI Patient seen with recent bilateral parotid swelling. She did not have evidence to suggest bacterial infection. She had complained of some dry mouth symptoms and we obtained some lab work including chemistries and CBC. She had Sjogren's antibodies which are negative. Sedimentation rate minimally elevated at 47. She declined HIV testing. Chest x-ray showed no bilateral adenopathy. No hypercalcemia or other suggestion of sarcoidosis. No recent cough. She had some leftover Augmentin which she started and she states she thinks her parotid glands are slightly less swollen today.  New complaint of mild left eyelid swelling. She's also had lumbar crusted drainage past couple days early in the morning. No blurred vision. No significant eye pain. No peri-ocular rash  Past Medical History:  Diagnosis Date  . Anemia   . HYPERLIPIDEMIA 10/11/2009  . HYPERTENSION 09/07/2009  . Kidney stones   . Renal disorder    Past Surgical History:  Procedure Laterality Date  . BREAST REDUCTION SURGERY  1999  . CESAREAN SECTION  2004  . Cysts removed from chest wall      reports that  has never smoked. she has never used smokeless tobacco. She reports that she does not drink alcohol or use drugs. family history includes Diabetes in her mother; Hypertension in her father and mother; Lung cancer in her maternal grandfather; Prostate cancer in her maternal uncle; Stomach cancer in her maternal uncle and paternal grandmother; Thyroid disease in her maternal uncle. Allergies  Allergen Reactions  . Amlodipine Nausea Only  . Collagenase Hives  . Aspirin Rash     Review of Systems  Constitutional: Negative for chills and fever.  Eyes: Positive for discharge. Negative for itching and visual disturbance.  Respiratory: Negative for shortness of breath.   Cardiovascular: Negative for chest pain.  Gastrointestinal:  Negative for abdominal pain.  Musculoskeletal: Negative for arthralgias.  Skin: Negative for rash.  Neurological: Negative for headaches.  Hematological: Negative for adenopathy.       Objective:   Physical Exam  Constitutional: She appears well-developed and well-nourished.  HENT:  Right Ear: External ear normal.  Left Ear: External ear normal.  Eyes: EOM are normal. Right eye exhibits no discharge. Left eye exhibits no discharge.  Left conjunctivae may be just very slightly more erythematous compared to the right... No evidence for stye.  Slightly prominent mebomian gland left upper lateral lid but no evidence for abscess       Assessment:     #1 recent bilateral parotid prominence which seems to be improving already. Etiology unclear.  No hilar adenopathy, hypercalcemia, or other history to suggest sarcoidosis. Sjogren's antibodies negative. Low risk for HIV and patient declined testing  #2 mild left eyelid swelling. Possible early conjunctivitis. No cellulitis/periorbital cellulitis    Plan:     -Warm compresses to left eye several times daily -Polytrim ophthalmic drops 2 drops left eye every 4 hours while awake -stay well-hydrated -Follow-up for any persistent or recurrent parotid swelling  Eulas Post MD [facility] at [facility]

## 2017-10-02 ENCOUNTER — Other Ambulatory Visit: Payer: Self-pay | Admitting: Gynecology

## 2017-10-02 DIAGNOSIS — D500 Iron deficiency anemia secondary to blood loss (chronic): Secondary | ICD-10-CM

## 2017-10-08 ENCOUNTER — Telehealth: Payer: Self-pay | Admitting: Family Medicine

## 2017-10-08 DIAGNOSIS — R599 Enlarged lymph nodes, unspecified: Secondary | ICD-10-CM

## 2017-10-08 NOTE — Telephone Encounter (Signed)
Would be happy to refer but what is her reason for requesting referral?

## 2017-10-08 NOTE — Telephone Encounter (Signed)
Copied from [place] [id number]. Topic: Referral - Request >> Oct 08, 2017 11:30 AM Valla Leaver wrote: Reason for CRM: Patient requesting a referral to endocrinologist Dr. Berkley Harvey P: [phone] Address [place] [address] [address]. Patietn would like a call to confirm that referral was sent to his office.

## 2017-10-08 NOTE — Telephone Encounter (Signed)
Patient would like to see Dr Buddy Duty for her "swollen glands"

## 2017-10-09 NOTE — Telephone Encounter (Signed)
This is not a typical endocrine referral- but OK to refer.

## 2017-10-10 NOTE — Telephone Encounter (Signed)
Referral placed.

## 2017-10-13 ENCOUNTER — Encounter: Payer: Self-pay | Admitting: Family Medicine

## 2017-10-13 ENCOUNTER — Ambulatory Visit: Payer: BLUE CROSS/BLUE SHIELD | Admitting: Family Medicine

## 2017-10-13 VITALS — BP 130/90 | HR 85 | Temp 98.7°F | Wt 274.2 lb

## 2017-10-13 DIAGNOSIS — K1121 Acute sialoadenitis: Secondary | ICD-10-CM | POA: Diagnosis not present

## 2017-10-13 DIAGNOSIS — R220 Localized swelling, mass and lump, head: Secondary | ICD-10-CM | POA: Diagnosis not present

## 2017-10-13 MED ORDER — PREDNISONE 10 MG PO TABS: ORAL_TABLET | ORAL | 0 refills | Status: DC

## 2017-10-13 NOTE — Progress Notes (Signed)
Subjective:     Patient ID: Ashley Henry, female   DOB: Aug 05, 1976, 42 y.o.   MRN: [id number]  HPI Pt seen for follow up bilateral facial swelling, probable parotitis.  Went to endocrinologist who recommended she get some "imaging" to clarify.  Recent sed rate 47.  Other labs unremarkable.  No recent fever, sore throat, adenopathy, rash, headache.  Recent CXR -no acute abnormality Sjogren's antibodies negative.  She has taken OTC NSAIDS with no relief in swelling.   Past Medical History:  Diagnosis Date  . Anemia   . HYPERLIPIDEMIA 10/11/2009  . HYPERTENSION 09/07/2009  . Kidney stones   . Renal disorder    Past Surgical History:  Procedure Laterality Date  . BREAST REDUCTION SURGERY  1999  . CESAREAN SECTION  2004  . Cysts removed from chest wall      reports that  has never smoked. she has never used smokeless tobacco. She reports that she does not drink alcohol or use drugs. family history includes Diabetes in her mother; Hypertension in her father and mother; Lung cancer in her maternal grandfather; Prostate cancer in her maternal uncle; Stomach cancer in her maternal uncle and paternal grandmother; Thyroid disease in her maternal uncle. Allergies  Allergen Reactions  . Amlodipine Nausea Only  . Collagenase Hives  . Aspirin Rash     Review of Systems  Constitutional: Negative for chills, fatigue and fever.  HENT: Negative for congestion.   Eyes: Negative for visual disturbance.  Respiratory: Negative for cough and shortness of breath.   Cardiovascular: Negative for chest pain.  Gastrointestinal: Negative for abdominal pain.  Neurological: Negative for weakness.  Hematological: Negative for adenopathy.       Objective:   Physical Exam  Constitutional: She appears well-developed and well-nourished.  HENT:  Bilateral parotid prominence.  No erythema.  No warmth.  nontender  Neck: Neck supple. No thyromegaly present.  Cardiovascular: Normal rate and regular rhythm.   Pulmonary/Chest: Effort normal and breath sounds normal. No respiratory distress. She has no wheezes. She has no rales.  Lymphadenopathy:    She has no cervical adenopathy.       Assessment:     Acute bilateral parotid enlargement.  Doubt Mumps.  No evidence for bacterial parotitis    Plan:     -check further labs with sed rate, HIV antibody, Hep C antibody,CMV, EBV, ANA,  -consider imaging to further assess CT maxillofacial to further assess parotid swelling.Eulas Post MD [facility]

## 2017-10-14 LAB — SEDIMENTATION RATE: Sed Rate: 104 mm/hr — ABNORMAL HIGH (ref 0–20)

## 2017-10-14 LAB — C-REACTIVE PROTEIN: CRP: 6.4 mg/dL (ref 0.5–20.0)

## 2017-10-16 LAB — EBV AB TO VIRAL CAPSID AG PNL, IGG+IGM
EBV VCA IgG: >600 U/mL — ABNORMAL HIGH (ref 0.0–17.9)
EBV VCA IgM: <36 U/mL (ref 0.0–35.9)

## 2017-10-17 ENCOUNTER — Telehealth: Payer: Self-pay | Admitting: Family Medicine

## 2017-10-17 LAB — ANA: Anti Nuclear Antibody(ANA): NEGATIVE

## 2017-10-17 LAB — MUMPS ANTIBODY, IGM: Mumps IgM Value: <1:20 {titer}

## 2017-10-17 LAB — CMV ABS, IGG+IGM (CYTOMEGALOVIRUS)
CMV IgM: <30 AU/mL
Cytomegalovirus Ab-IgG: 1.1 U/mL — ABNORMAL HIGH

## 2017-10-17 LAB — HEPATITIS C ANTIBODY
Hepatitis C Ab: NONREACTIVE
SIGNAL TO CUT-OFF: 0.01 (ref <1.00)

## 2017-10-17 LAB — HIV ANTIBODY (ROUTINE TESTING W REFLEX): HIV 1&2 Ab, 4th Generation: NONREACTIVE

## 2017-10-17 NOTE — Telephone Encounter (Signed)
Copied from [place] [phone]. Topic: Quick Communication - See Telephone Encounter >> Oct 17, 2017  9:13 AM Ahmed Prima L wrote: CRM for notification. See Telephone encounter for:   10/17/17.  Patient stated that she was told by Dr Elease Hashimoto that if she had not heard from the office that would be giving her the CT scan to let him know. She said she has not heard anything. Call back is [phone]

## 2017-10-27 ENCOUNTER — Inpatient Hospital Stay: Admission: RE | Admit: 2017-10-27 | Payer: BLUE CROSS/BLUE SHIELD | Source: Ambulatory Visit

## 2017-10-29 ENCOUNTER — Ambulatory Visit (INDEPENDENT_AMBULATORY_CARE_PROVIDER_SITE_OTHER)
Admission: RE | Admit: 2017-10-29 | Discharge: 2017-10-29 | Disposition: A | Discharge status: Home / Self Care | Payer: BLUE CROSS/BLUE SHIELD | Source: Ambulatory Visit | Attending: Family Medicine | Admitting: Family Medicine

## 2017-10-29 DIAGNOSIS — R220 Localized swelling, mass and lump, head: Secondary | ICD-10-CM | POA: Diagnosis not present

## 2017-10-29 MED ORDER — IOPAMIDOL (ISOVUE-300) INJECTION 61%
80.0000 mL | Freq: Once | INTRAVENOUS | Status: AC | PRN
  Administered 2017-10-29: 80 mL via INTRAVENOUS

## 2017-11-03 ENCOUNTER — Telehealth: Payer: Self-pay | Admitting: Family Medicine

## 2017-11-03 MED ORDER — PREDNISONE 10 MG PO TABS: ORAL_TABLET | ORAL | 0 refills | Status: DC

## 2017-11-03 NOTE — Telephone Encounter (Signed)
Can refill once- but we cannot give this regularly.

## 2017-11-03 NOTE — Telephone Encounter (Signed)
Prednisone refill request [facility]  [address]  [place], [place]

## 2017-11-03 NOTE — Addendum Note (Signed)
Addended by: Westley Hummer B on: 11/03/2017 05:39 PM   Modules accepted: Orders

## 2017-11-03 NOTE — Telephone Encounter (Signed)
Copied from [place] [id number]. Topic: Quick Communication - Rx Refill/Question >> Nov 03, 2017 10:42 AM Corie Chiquito, [place] wrote: Medication: Prednisone   Has the patient contacted their pharmacy?No   Patient calling to see if Dr.Burchette could call her in some more of the above medication. Stated that swelling has returned  Preferred Pharmacy (with phone number or street name): [facility] [address] [place] [phone]   Agent: Please be advised that RX refills may take up to 3 business days. We ask that you follow-up with your pharmacy.

## 2017-11-11 ENCOUNTER — Other Ambulatory Visit: Payer: Self-pay | Admitting: Physician Assistant

## 2017-11-11 DIAGNOSIS — H04033 Chronic enlargement of bilateral lacrimal glands: Secondary | ICD-10-CM

## 2017-11-14 ENCOUNTER — Ambulatory Visit
Admission: RE | Admit: 2017-11-14 | Discharge: 2017-11-14 | Disposition: A | Discharge status: Home / Self Care | Payer: BLUE CROSS/BLUE SHIELD | Source: Ambulatory Visit | Attending: Physician Assistant | Admitting: Physician Assistant

## 2017-11-14 DIAGNOSIS — H04033 Chronic enlargement of bilateral lacrimal glands: Secondary | ICD-10-CM

## 2017-11-17 ENCOUNTER — Other Ambulatory Visit: Payer: Self-pay | Admitting: Ophthalmology

## 2017-12-25 ENCOUNTER — Telehealth: Payer: Self-pay | Admitting: Family Medicine

## 2017-12-25 NOTE — Telephone Encounter (Signed)
Copied from [place] [id number]. Topic: General - Other >> Dec 25, 2017  2:43 PM Valla Leaver wrote: Reason for CRM: Sharyn Lull CSR with [facility]'s Dr. Marshall Cork would like the x-ray results of sarcoidosis and lab results for the patient faxed to his office at [phone]. Please call if any questions.

## 2017-12-26 NOTE — Telephone Encounter (Signed)
Last chest x-ray and lab results were faxed.

## 2018-01-16 ENCOUNTER — Other Ambulatory Visit: Payer: Self-pay | Admitting: Surgery

## 2018-04-08 ENCOUNTER — Other Ambulatory Visit: Payer: Self-pay

## 2018-04-08 ENCOUNTER — Encounter (HOSPITAL_BASED_OUTPATIENT_CLINIC_OR_DEPARTMENT_OTHER): Payer: Self-pay | Admitting: *Deleted

## 2018-04-13 ENCOUNTER — Encounter (HOSPITAL_BASED_OUTPATIENT_CLINIC_OR_DEPARTMENT_OTHER)
Admission: RE | Admit: 2018-04-13 | Discharge: 2018-04-13 | Disposition: A | Discharge status: Home / Self Care | Payer: BLUE CROSS/BLUE SHIELD | Source: Ambulatory Visit | Attending: Surgery | Admitting: Surgery

## 2018-04-13 NOTE — H&P (Signed)
  Ashley Henry  Location: [facility] Patient #: [id number] DOB: August 05, 1976 Single / Language: Cleophus Molt / Race: White Female   History of Present Illness The patient is a 42 year old female who presents with a complaint of Mass. She is here today for evaluation of a chest wall mass at the area of her sternum. She's had a previous sebaceous cyst removed lateral to this area along the edge of the right breast. This mass causing discomfort. It is been present for approximately the last 6 months and is getting larger. It is never drained.  Past Surgical History Breast Reconstruction  Bilateral. Cesarean Section - 1   Diagnostic Studies History ) Colonoscopy  never Mammogram  1-3 years ago  Allergies ( Aspirin *ANALGESICS - NonNarcotic*  Santyl *DERMATOLOGICALS*     Social History  No alcohol use  No caffeine use  No drug use  Tobacco use  Never smoker.  Family History Diabetes Mellitus  Mother. Hypertension  Mother. Respiratory Condition  Daughter, Mother. Allergies  Aspirin *ANALGESICS - NonNarcotic*  Santyl *DERMATOLOGICALS*   Medication History  Bepreve (1.5% Solution, Ophthalmic) Active. Ortho Tri-Cyclen Lo (0.18/0.215/0.25MG -25 MCG Tablet, Oral) Active. Medications Reconciled  Vitals  Weight: 278.2 lb Height: 64in Body Surface Area: 2.25 m Body Mass Index: 47.75 kg/m  Temp.: 98.27F(Oral)  BP: 120/78 (Sitting, Left Arm, Standard)     Physical Exam   The physical exam findings are as follows: Note:On examination, there is a 2 cm mass at the upper sternum between the 2 breasts. It is near her old scar at the right breast Lungs clear CV RRR Abdomen soft, BT/ND Generally well in appearance    Assessment & Plan  CHEST WALL MASS (R22.2)  Impression: We discussed the mass in detail. I recommended surgical excision of this area so the mass could be removed for symptom relief as well as histologic evaluation  drawn a malignancy. I discussed this with her in detail. I discussed the risk of bleeding, infection, recurrence of the mass, need for further procedures, postoperative recovery, etc. She understands and wishes to proceed with surgery

## 2018-04-13 NOTE — Progress Notes (Signed)
Patient given boost with instructions and verbalized understanding. Pt had anesthesia consult with Dr. Fransisco Beau and surgery will go ahead as scheduled.

## 2018-04-14 ENCOUNTER — Ambulatory Visit (HOSPITAL_BASED_OUTPATIENT_CLINIC_OR_DEPARTMENT_OTHER): Payer: BLUE CROSS/BLUE SHIELD | Admitting: Anesthesiology

## 2018-04-14 ENCOUNTER — Encounter (HOSPITAL_BASED_OUTPATIENT_CLINIC_OR_DEPARTMENT_OTHER): Payer: Self-pay | Admitting: *Deleted

## 2018-04-14 ENCOUNTER — Encounter (HOSPITAL_BASED_OUTPATIENT_CLINIC_OR_DEPARTMENT_OTHER)
Admission: RE | Disposition: A | Discharge status: Home / Self Care | Payer: Self-pay | Source: Ambulatory Visit | Attending: Surgery

## 2018-04-14 ENCOUNTER — Ambulatory Visit (HOSPITAL_BASED_OUTPATIENT_CLINIC_OR_DEPARTMENT_OTHER)
Admission: RE | Admit: 2018-04-14 | Discharge: 2018-04-14 | Disposition: A | Discharge status: Home / Self Care | Payer: BLUE CROSS/BLUE SHIELD | Source: Ambulatory Visit | Attending: Surgery | Admitting: Surgery

## 2018-04-14 ENCOUNTER — Other Ambulatory Visit: Payer: Self-pay

## 2018-04-14 DIAGNOSIS — L928 Other granulomatous disorders of the skin and subcutaneous tissue: Secondary | ICD-10-CM | POA: Insufficient documentation

## 2018-04-14 DIAGNOSIS — Z886 Allergy status to analgesic agent status: Secondary | ICD-10-CM | POA: Insufficient documentation

## 2018-04-14 DIAGNOSIS — Z79899 Other long term (current) drug therapy: Secondary | ICD-10-CM | POA: Diagnosis not present

## 2018-04-14 DIAGNOSIS — I10 Essential (primary) hypertension: Secondary | ICD-10-CM | POA: Diagnosis not present

## 2018-04-14 DIAGNOSIS — Z793 Long term (current) use of hormonal contraceptives: Secondary | ICD-10-CM | POA: Diagnosis not present

## 2018-04-14 DIAGNOSIS — Z6841 Body Mass Index (BMI) 40.0 and over, adult: Secondary | ICD-10-CM | POA: Diagnosis not present

## 2018-04-14 DIAGNOSIS — R222 Localized swelling, mass and lump, trunk: Secondary | ICD-10-CM | POA: Diagnosis present

## 2018-04-14 HISTORY — DX: Prediabetes: R73.03

## 2018-04-14 HISTORY — DX: Essential (primary) hypertension: I10

## 2018-04-14 HISTORY — PX: MASS EXCISION: SHX2000

## 2018-04-14 LAB — POCT PREGNANCY, URINE: Preg Test, Ur: NEGATIVE

## 2018-04-14 SURGERY — EXCISION MASS
Anesthesia: General

## 2018-04-14 MED ORDER — ACETAMINOPHEN 500 MG PO TABS
ORAL_TABLET | ORAL | Status: AC
  Filled 2018-04-14: qty 2

## 2018-04-14 MED ORDER — PROPOFOL 10 MG/ML IV BOLUS
INTRAVENOUS | Status: DC | PRN
  Administered 2018-04-14: 200 mg via INTRAVENOUS

## 2018-04-14 MED ORDER — OXYCODONE HCL 5 MG PO TABS: 5.0000 mg | ORAL_TABLET | Freq: Once | ORAL | Status: DC | PRN

## 2018-04-14 MED ORDER — MIDAZOLAM HCL 2 MG/2ML IJ SOLN
INTRAMUSCULAR | Status: AC
  Filled 2018-04-14: qty 2

## 2018-04-14 MED ORDER — DEXAMETHASONE SODIUM PHOSPHATE 10 MG/ML IJ SOLN
INTRAMUSCULAR | Status: AC
Start: 2018-04-14 — End: ?
  Filled 2018-04-14: qty 1

## 2018-04-14 MED ORDER — FENTANYL CITRATE (PF) 100 MCG/2ML IJ SOLN
INTRAMUSCULAR | Status: AC
  Filled 2018-04-14: qty 2

## 2018-04-14 MED ORDER — SCOPOLAMINE 1 MG/3DAYS TD PT72: 1.0000 | MEDICATED_PATCH | Freq: Once | TRANSDERMAL | Status: DC | PRN

## 2018-04-14 MED ORDER — LACTATED RINGERS IV SOLN: INTRAVENOUS | Status: DC

## 2018-04-14 MED ORDER — CEFAZOLIN SODIUM-DEXTROSE 2-4 GM/100ML-% IV SOLN
INTRAVENOUS | Status: AC
  Filled 2018-04-14: qty 100

## 2018-04-14 MED ORDER — FENTANYL CITRATE (PF) 100 MCG/2ML IJ SOLN
25.0000 ug | INTRAMUSCULAR | Status: DC | PRN
  Administered 2018-04-14 (×2): 25 ug via INTRAVENOUS
  Administered 2018-04-14: 50 ug via INTRAVENOUS

## 2018-04-14 MED ORDER — BUPIVACAINE-EPINEPHRINE 0.5% -1:200000 IJ SOLN
INTRAMUSCULAR | Status: DC | PRN
  Administered 2018-04-14: 10 mL

## 2018-04-14 MED ORDER — CEFAZOLIN SODIUM-DEXTROSE 2-4 GM/100ML-% IV SOLN
2.0000 g | INTRAVENOUS | Status: AC
  Administered 2018-04-14: 2 g via INTRAVENOUS

## 2018-04-14 MED ORDER — CHLORHEXIDINE GLUCONATE CLOTH 2 % EX PADS: 6.0000 | MEDICATED_PAD | Freq: Once | CUTANEOUS | Status: DC

## 2018-04-14 MED ORDER — LIDOCAINE HCL (CARDIAC) PF 100 MG/5ML IV SOSY
PREFILLED_SYRINGE | INTRAVENOUS | Status: AC
Start: 2018-04-14 — End: ?
  Filled 2018-04-14: qty 5

## 2018-04-14 MED ORDER — DEXAMETHASONE SODIUM PHOSPHATE 4 MG/ML IJ SOLN
INTRAMUSCULAR | Status: DC | PRN
  Administered 2018-04-14: 10 mg via INTRAVENOUS

## 2018-04-14 MED ORDER — FENTANYL CITRATE (PF) 100 MCG/2ML IJ SOLN
50.0000 ug | INTRAMUSCULAR | Status: DC | PRN
  Administered 2018-04-14: 100 ug via INTRAVENOUS

## 2018-04-14 MED ORDER — GABAPENTIN 300 MG PO CAPS
300.0000 mg | ORAL_CAPSULE | ORAL | Status: AC
  Administered 2018-04-14: 300 mg via ORAL

## 2018-04-14 MED ORDER — CELECOXIB 200 MG PO CAPS
200.0000 mg | ORAL_CAPSULE | ORAL | Status: AC
  Administered 2018-04-14: 200 mg via ORAL

## 2018-04-14 MED ORDER — ACETAMINOPHEN 500 MG PO TABS
1000.0000 mg | ORAL_TABLET | ORAL | Status: AC
  Administered 2018-04-14: 1000 mg via ORAL

## 2018-04-14 MED ORDER — GABAPENTIN 300 MG PO CAPS
ORAL_CAPSULE | ORAL | Status: AC
  Filled 2018-04-14: qty 1

## 2018-04-14 MED ORDER — ONDANSETRON HCL 4 MG/2ML IJ SOLN
INTRAMUSCULAR | Status: AC
  Filled 2018-04-14: qty 2

## 2018-04-14 MED ORDER — MIDAZOLAM HCL 2 MG/2ML IJ SOLN
1.0000 mg | INTRAMUSCULAR | Status: DC | PRN
  Administered 2018-04-14: 2 mg via INTRAVENOUS

## 2018-04-14 MED ORDER — CELECOXIB 200 MG PO CAPS
ORAL_CAPSULE | ORAL | Status: AC
Start: 2018-04-14 — End: ?
  Filled 2018-04-14: qty 1

## 2018-04-14 MED ORDER — ONDANSETRON HCL 4 MG/2ML IJ SOLN
4.0000 mg | Freq: Four times a day (QID) | INTRAMUSCULAR | Status: AC | PRN
  Administered 2018-04-14: 4 mg via INTRAVENOUS

## 2018-04-14 MED ORDER — PROPOFOL 500 MG/50ML IV EMUL
INTRAVENOUS | Status: AC
Start: 2018-04-14 — End: ?
  Filled 2018-04-14: qty 50

## 2018-04-14 MED ORDER — OXYCODONE HCL 5 MG PO TABS: 5.0000 mg | ORAL_TABLET | Freq: Four times a day (QID) | ORAL | 0 refills | Status: DC | PRN

## 2018-04-14 MED ORDER — OXYCODONE HCL 5 MG/5ML PO SOLN: 5.0000 mg | Freq: Once | ORAL | Status: DC | PRN

## 2018-04-14 MED ORDER — LIDOCAINE HCL (CARDIAC) PF 100 MG/5ML IV SOSY
PREFILLED_SYRINGE | INTRAVENOUS | Status: DC | PRN
  Administered 2018-04-14: 60 mg via INTRAVENOUS

## 2018-04-14 SURGICAL SUPPLY — 37 items
BLADE HEX COATED 2.75 (ELECTRODE) ×2 IMPLANT
BLADE SURG 15 STRL LF DISP TIS (BLADE) ×1 IMPLANT
BLADE SURG 15 STRL SS (BLADE) ×1
CANISTER SUCT 1200ML W/VALVE (MISCELLANEOUS) IMPLANT
CHLORAPREP W/TINT 26ML (MISCELLANEOUS) ×2 IMPLANT
COVER BACK TABLE 60X90IN (DRAPES) ×2 IMPLANT
COVER MAYO STAND STRL (DRAPES) IMPLANT
DECANTER SPIKE VIAL GLASS SM (MISCELLANEOUS) IMPLANT
DERMABOND ADVANCED (GAUZE/BANDAGES/DRESSINGS) ×1
DERMABOND ADVANCED .7 DNX12 (GAUZE/BANDAGES/DRESSINGS) ×1 IMPLANT
DRAPE LAPAROTOMY 100X72 PEDS (DRAPES) ×2 IMPLANT
DRAPE UTILITY XL STRL (DRAPES) ×2 IMPLANT
ELECT REM PT RETURN 9FT ADLT (ELECTROSURGICAL) ×2
ELECTRODE REM PT RTRN 9FT ADLT (ELECTROSURGICAL) ×1 IMPLANT
GLOVE SURG SIGNA 7.5 PF LTX (GLOVE) ×2 IMPLANT
GOWN STRL REUS W/ TWL LRG LVL3 (GOWN DISPOSABLE) ×1 IMPLANT
GOWN STRL REUS W/ TWL XL LVL3 (GOWN DISPOSABLE) ×1 IMPLANT
GOWN STRL REUS W/TWL LRG LVL3 (GOWN DISPOSABLE) ×1
GOWN STRL REUS W/TWL XL LVL3 (GOWN DISPOSABLE) ×1
NEEDLE HYPO 25X1 1.5 SAFETY (NEEDLE) ×2 IMPLANT
NS IRRIG 1000ML POUR BTL (IV SOLUTION) IMPLANT
PACK BASIN DAY SURGERY FS (CUSTOM PROCEDURE TRAY) ×2 IMPLANT
PENCIL BUTTON HOLSTER BLD 10FT (ELECTRODE) ×2 IMPLANT
SLEEVE SCD COMPRESS KNEE MED (MISCELLANEOUS) ×2 IMPLANT
SPONGE LAP 18X18 RF (DISPOSABLE) IMPLANT
SPONGE LAP 4X18 RFD (DISPOSABLE) ×2 IMPLANT
SUT MNCRL AB 4-0 PS2 18 (SUTURE) ×2 IMPLANT
SUT VIC AB 2-0 SH 27 (SUTURE) ×1
SUT VIC AB 2-0 SH 27XBRD (SUTURE) ×1 IMPLANT
SUT VIC AB 3-0 SH 27 (SUTURE) ×1
SUT VIC AB 3-0 SH 27X BRD (SUTURE) ×1 IMPLANT
SYR BULB 3OZ (MISCELLANEOUS) ×2 IMPLANT
SYR CONTROL 10ML LL (SYRINGE) ×2 IMPLANT
TOWEL GREEN STERILE FF (TOWEL DISPOSABLE) ×2 IMPLANT
TOWEL OR NON WOVEN STRL DISP B (DISPOSABLE) ×2 IMPLANT
TUBE CONNECTING 20X1/4 (TUBING) IMPLANT
YANKAUER SUCT BULB TIP NO VENT (SUCTIONS) IMPLANT

## 2018-04-14 NOTE — Anesthesia Preprocedure Evaluation (Addendum)
Anesthesia Evaluation  Patient identified by MRN, date of birth, ID band Patient awake    Reviewed: Allergy & Precautions, H&P , NPO status , Patient's Chart, lab work & pertinent test results  Airway Mallampati: II   Neck ROM: full    Dental   Pulmonary neg pulmonary ROS,    breath sounds clear to auscultation       Cardiovascular hypertension,  Rhythm:regular Rate:Normal     Neuro/Psych  Headaches,    GI/Hepatic   Endo/Other  Morbid obesity  Renal/GU stones     Musculoskeletal   Abdominal   Peds  Hematology  (+) anemia ,   Anesthesia Other Findings   Reproductive/Obstetrics                            Anesthesia Physical Anesthesia Plan  ASA: II  Anesthesia Plan: General   Post-op Pain Management:    Induction: Intravenous  PONV Risk Score and Plan: 3  Airway Management Planned: LMA  Additional Equipment:   Intra-op Plan:   Post-operative Plan:   Informed Consent: I have reviewed the patients History and Physical, chart, labs and discussed the procedure including the risks, benefits and alternatives for the proposed anesthesia with the patient or authorized representative who has indicated his/her understanding and acceptance.     Plan Discussed with: CRNA, Anesthesiologist and Surgeon  Anesthesia Plan Comments:         Anesthesia Quick Evaluation

## 2018-04-14 NOTE — Anesthesia Postprocedure Evaluation (Signed)
Anesthesia Post Note  Patient: Ashley Henry  Procedure(s) Performed: EXCISION OF CHEST WALL MASS (N/A )     Patient location during evaluation: PACU Anesthesia Type: General Level of consciousness: awake and alert Pain management: pain level controlled Vital Signs Assessment: post-procedure vital signs reviewed and stable Respiratory status: spontaneous breathing, nonlabored ventilation, respiratory function stable and patient connected to nasal cannula oxygen Cardiovascular status: blood pressure returned to baseline and stable Postop Assessment: no apparent nausea or vomiting Anesthetic complications: no    Last Vitals:  Vitals:   04/14/18 0900 04/14/18 0914  BP: 118/76 130/66  Pulse: 86 84  Resp: 17 20  Temp:  36.8 C  SpO2: 100% 100%    Last Pain:  Vitals:   04/14/18 0914  TempSrc:   PainSc: [place]

## 2018-04-14 NOTE — Anesthesia Procedure Notes (Signed)
Procedure Name: LMA Insertion Date/Time: 04/14/2018 7:45 AM Performed by: Lyndee Leo, CRNA Pre-anesthesia Checklist: Patient identified, Emergency Drugs available, Suction available and Patient being monitored Patient Re-evaluated:Patient Re-evaluated prior to induction Oxygen Delivery Method: Circle system utilized Preoxygenation: Pre-oxygenation with 100% oxygen Induction Type: IV induction Ventilation: Mask ventilation without difficulty LMA: LMA inserted LMA Size: 4.0 Number of attempts: 1 Airway Equipment and Method: Bite block Placement Confirmation: positive ETCO2 Tube secured with: Tape Dental Injury: Teeth and Oropharynx as per pre-operative assessment

## 2018-04-14 NOTE — Op Note (Signed)
EXCISION OF CHEST WALL MASS  Procedure Note  Ashley Henry 04/14/2018   Pre-op Diagnosis: Chest wall mass     Post-op Diagnosis: same  Procedure(s): EXCISION OF CHEST WALL MASS (2.5 cm)  Surgeon(s): Coralie Keens, MD  Anesthesia: General  Staff:  Circulator: Harrel Lemon, RN Scrub Person: Lorenza Burton, CST; Haydee Monica Circulator Assistant: Philomena Doheny, RN  Estimated Blood Loss: Minimal               Specimens: sent to path  Procedure: The patient was brought to the operating room and identified as correct patient.  She was placed supine on the operating room table and anesthesia was induced.  Her chest was prepped and draped in the usual sterile fashion.  She had a 2.5 cm mass at the uppermost part of her sternum between the breasts.  She had an infected sebaceous cyst here in the past which was excised.  I anesthetized the skin with Marcaine and then made an incision along the right inframammary ridge of the breast.  I included an ellipse of skin going over the top of the mass.  I then excised the mass in its entirety staying right underneath the skin and going down to the chest wall.  Once the mass was removed it was sent to pathology for evaluation.  I achieved hemostasis with the cautery.  I anesthetized the wound further with Marcaine.  I then closed the subcutaneous tissue with interrupted 2-0 Vicryl sutures and closed the skin with a running 4-0 Monocryl.  Dermabond was then applied.  The patient tolerated the procedure well.  All the counts were correct at the end of the procedure.  The patient was then extubated in the operating room and taken in a stable condition to the recovery room.          Ashley Henry A   Date: 04/14/2018  Time: 8:09 AM

## 2018-04-14 NOTE — Discharge Instructions (Signed)
Ok to shower starting tomorrow  Ice pack, tylenol, ibuprofen also for pain  No vigorous activity for 1 week    Post Anesthesia Home Care Instructions  Activity: Get plenty of rest for the remainder of the day. A responsible individual must stay with you for 24 hours following the procedure.  For the next 24 hours, DO NOT: -Drive a car -Paediatric nurse -Drink alcoholic beverages -Take any medication unless instructed by your physician -Make any legal decisions or sign important papers.  Meals: Start with liquid foods such as gelatin or soup. Progress to regular foods as tolerated. Avoid greasy, spicy, heavy foods. If nausea and/or vomiting occur, drink only clear liquids until the nausea and/or vomiting subsides. Call your physician if vomiting continues.  Special Instructions/Symptoms: Your throat may feel dry or sore from the anesthesia or the breathing tube placed in your throat during surgery. If this causes discomfort, gargle with warm salt water. The discomfort should disappear within 24 hours.  If you had a scopolamine patch placed behind your ear for the management of post- operative nausea and/or vomiting:  1. The medication in the patch is effective for 72 hours, after which it should be removed.  Wrap patch in a tissue and discard in the trash. Wash hands thoroughly with soap and water. 2. You may remove the patch earlier than 72 hours if you experience unpleasant side effects which may include dry mouth, dizziness or visual disturbances. 3. Avoid touching the patch. Wash your hands with soap and water after contact with the patch.

## 2018-04-14 NOTE — Interval H&P Note (Signed)
History and Physical Interval Note: no change in H and P  04/14/2018 7:02 AM  Ashley Henry  has presented today for surgery, with the diagnosis of Chest wall mass  The various methods of treatment have been discussed with the patient and family. After consideration of risks, benefits and other options for treatment, the patient has consented to  Procedure(s): EXCISION OF CHEST WALL MASS (N/A) as a surgical intervention .  The patient's history has been reviewed, patient examined, no change in status, stable for surgery.  I have reviewed the patient's chart and labs.  Questions were answered to the patient's satisfaction.     [name] A

## 2018-04-14 NOTE — Transfer of Care (Signed)
Immediate Anesthesia Transfer of Care Note  Patient: Ashley Henry  Procedure(s) Performed: EXCISION OF CHEST WALL MASS (N/A )  Patient Location: PACU  Anesthesia Type:General  Level of Consciousness: awake, alert  and oriented  Airway & Oxygen Therapy: Patient Spontanous Breathing and Patient connected to face mask oxygen  Post-op Assessment: Report given to RN and Post -op Vital signs reviewed and stable  Post vital signs: Reviewed and stable  Last Vitals:  Vitals Value Taken Time  BP    Temp    Pulse 89 04/14/2018  8:16 AM  Resp 19 04/14/2018  8:16 AM  SpO2 100 % 04/14/2018  8:16 AM  Vitals shown include unvalidated device data.  Last Pain:  Vitals:   04/14/18 0657  TempSrc: Oral  PainSc: 0-No pain      Patients Stated Pain Goal: 4 (63/49/49 4473)  Complications: No apparent anesthesia complications

## 2018-04-15 ENCOUNTER — Encounter (HOSPITAL_BASED_OUTPATIENT_CLINIC_OR_DEPARTMENT_OTHER): Payer: Self-pay | Admitting: Surgery

## 2018-08-07 ENCOUNTER — Ambulatory Visit: Payer: BLUE CROSS/BLUE SHIELD | Admitting: Family Medicine

## 2018-08-07 ENCOUNTER — Encounter: Payer: Self-pay | Admitting: Family Medicine

## 2018-08-07 ENCOUNTER — Ambulatory Visit (INDEPENDENT_AMBULATORY_CARE_PROVIDER_SITE_OTHER): Payer: BLUE CROSS/BLUE SHIELD

## 2018-08-07 VITALS — BP 138/90 | HR 92 | Temp 98.3°F | Ht 64.0 in | Wt 273.7 lb

## 2018-08-07 DIAGNOSIS — M542 Cervicalgia: Secondary | ICD-10-CM

## 2018-08-07 NOTE — Progress Notes (Signed)
  Subjective:     Patient ID: Ashley Henry, female   DOB: 1976/07/07, 42 y.o.   MRN: [id number]  HPI  Patient is seen with about 44-month history of some pain lower cervical neck region.  She states for the past few weeks she has had increased pain especially and some mild swelling.  She denies any injury.  No radiculitis symptoms.  She has not noted any redness over the region.  Past Medical History:  Diagnosis Date  . Anemia   . HYPERLIPIDEMIA 10/11/2009  . Hypertension 2010   no meds now  . Kidney stones   . Pre-diabetes    HgbA1c 6.2 on 09-30-17  . Renal disorder    Past Surgical History:  Procedure Laterality Date  . BREAST REDUCTION SURGERY  1999  . CESAREAN SECTION  2004  . Cysts removed from chest wall    . EYE SURGERY Left   . MASS EXCISION N/A 04/14/2018   Procedure: EXCISION OF CHEST WALL MASS;  Surgeon: Coralie Keens, MD;  Location: [facility];  Service: General;  Laterality: N/A;    reports that she has never smoked. She has never used smokeless tobacco. She reports that she does not drink alcohol or use drugs. family history includes Diabetes in her mother; Hypertension in her father and mother; Lung cancer in her maternal grandfather; Prostate cancer in her maternal uncle; Stomach cancer in her maternal uncle and paternal grandmother; Thyroid disease in her maternal uncle. Allergies  Allergen Reactions  . Amlodipine Nausea Only  . Collagenase Hives  . Aspirin Rash    Review of Systems  Constitutional: Negative for appetite change, chills, fever and unexpected weight change.  Skin: Negative for rash.  Hematological: Negative for adenopathy.       Objective:   Physical Exam  Constitutional: She appears well-developed and well-nourished.  Cardiovascular: Normal rate and regular rhythm.  Pulmonary/Chest: Effort normal and breath sounds normal.  Musculoskeletal:  She has good range of motion cervical spine but does have some mild pain with  extreme flexion of the neck. She has some soft tissue prominence lower cervical region but no overlying erythema.  No fluctuance.  No warmth.  Minimal tenderness.  This appears to be more prominent fatty tissue.  We could not palpate any distinct cyst walls.       Assessment:     Several month history of pain lower cervical neck region.  No injury.  She has some prominent fatty tissue overlying this region.  Doubt sebaceous cyst.  ? lipoma    Plan:     -We will start with plain x-ray cervical spine given duration of symptoms. -May need some soft tissue imaging if she continues to have pain in this region.  No worrisome mass by palpation though.  Eulas Post MD [facility] at [facility]

## 2018-09-01 ENCOUNTER — Other Ambulatory Visit: Payer: Self-pay

## 2018-09-01 ENCOUNTER — Ambulatory Visit: Payer: BLUE CROSS/BLUE SHIELD | Admitting: Family Medicine

## 2018-09-01 ENCOUNTER — Encounter: Payer: Self-pay | Admitting: Family Medicine

## 2018-09-01 VITALS — BP 136/82 | Temp 98.2°F | Ht 64.0 in | Wt 271.7 lb

## 2018-09-01 DIAGNOSIS — S61239A Puncture wound without foreign body of unspecified finger without damage to nail, initial encounter: Secondary | ICD-10-CM

## 2018-09-01 MED ORDER — MUPIROCIN 2 % EX OINT: 1.0000 "application " | TOPICAL_OINTMENT | Freq: Two times a day (BID) | CUTANEOUS | 1 refills | Status: DC

## 2018-09-01 NOTE — Progress Notes (Signed)
  Subjective:     Patient ID: Ashley Henry, female   DOB: 01-28-1976, 42 y.o.   MRN: [id number]  HPI Patient is seen with concern for possible infection left ring finger distally.  She states she had her nails done about 3 weeks ago and there was accidental puncture from her manicurist.  She has had small slow to heal wound since then.  No surrounding erythema.  She describes some puslike drainage intermittently.  She has been applying Neosporin and using hydrogen peroxide without improvement.  No fevers or chills.  Last tetanus 2010  Past Medical History:  Diagnosis Date  . Anemia   . HYPERLIPIDEMIA 10/11/2009  . Hypertension 2010   no meds now  . Kidney stones   . Pre-diabetes    HgbA1c 6.2 on 09-30-17  . Renal disorder    Past Surgical History:  Procedure Laterality Date  . BREAST REDUCTION SURGERY  1999  . CESAREAN SECTION  2004  . Cysts removed from chest wall    . EYE SURGERY Left   . MASS EXCISION N/A 04/14/2018   Procedure: EXCISION OF CHEST WALL MASS;  Surgeon: Coralie Keens, MD;  Location: [facility];  Service: General;  Laterality: N/A;    reports that she has never smoked. She has never used smokeless tobacco. She reports that she does not drink alcohol or use drugs. family history includes Diabetes in her mother; Hypertension in her father and mother; Lung cancer in her maternal grandfather; Prostate cancer in her maternal uncle; Stomach cancer in her maternal uncle and paternal grandmother; Thyroid disease in her maternal uncle. Allergies  Allergen Reactions  . Amlodipine Nausea Only  . Collagenase Hives  . Collagenase Clostridium Histolyticum Hives  . Aspirin Rash     Review of Systems  Constitutional: Negative for chills and fever.  Hematological: Negative for adenopathy.       Objective:   Physical Exam Constitutional:      Appearance: Normal appearance.  Cardiovascular:     Rate and Rhythm: Normal rate and regular rhythm.  Skin:  Comments: Left ring finger distally reveals very small wound which is about 1 mm diameter.  There is no obvious granulation tissue.  No surrounding erythema or warmth.  No purulence.  No fluctuance.  Nontender.  Neurological:     Mental Status: She is alert.        Assessment:     Very small wound left distal ring finger with no signs of secondary infection currently.  No cellulitis changes.    Plan:     -Bactroban ointment apply 2-3 times daily -Keep hands out of water as much as possible -Touch base if not fully healing in 3 weeks  Eulas Post MD [facility] at [facility]

## 2018-09-01 NOTE — Patient Instructions (Signed)
Follow up for any increased redness, warmth, or swelling.

## 2018-09-02 ENCOUNTER — Other Ambulatory Visit: Payer: Self-pay | Admitting: Orthopaedic Surgery

## 2018-09-02 DIAGNOSIS — R221 Localized swelling, mass and lump, neck: Secondary | ICD-10-CM

## 2018-09-07 ENCOUNTER — Ambulatory Visit: Payer: BLUE CROSS/BLUE SHIELD | Admitting: Gynecology

## 2018-09-07 ENCOUNTER — Encounter: Payer: Self-pay | Admitting: Gynecology

## 2018-09-07 VITALS — BP 128/82 | Ht 64.0 in | Wt 269.0 lb

## 2018-09-07 DIAGNOSIS — Z01419 Encounter for gynecological examination (general) (routine) without abnormal findings: Secondary | ICD-10-CM

## 2018-09-07 MED ORDER — NORGESTIM-ETH ESTRAD TRIPHASIC 0.18/0.215/0.25 MG-25 MCG PO TABS: 1.0000 | ORAL_TABLET | Freq: Every day | ORAL | 11 refills | Status: DC

## 2018-09-07 NOTE — Patient Instructions (Signed)
Follow-up in 1 year for annual exam, sooner as needed. 

## 2018-09-07 NOTE — Progress Notes (Signed)
    Ashley Henry 1976-02-14 [id number]        42 y.o.  G2P0011 for annual gynecologic exam.  Doing well without gynecologic complaints  Past medical history,surgical history, problem list, medications, allergies, family history and social history were all reviewed and documented as reviewed in the EPIC chart.  ROS:  Performed with pertinent positives and negatives included in the history, assessment and plan.   Additional significant findings : None   Exam: [name] Vitals:   09/07/18 1115  BP: 128/82  Weight: 269 lb (122 kg)  Height: 5\' 4"  (1.626 m)   Body mass index is 46.17 kg/m.  General appearance:  Normal affect, orientation and appearance. Skin: Grossly normal HEENT: Without gross lesions.  No cervical or supraclavicular adenopathy. Thyroid normal.  Lungs:  Clear without wheezing, rales or rhonchi Cardiac: RR, without RMG Abdominal:  Soft, nontender, without masses, guarding, rebound, organomegaly or hernia Breasts:  Examined lying and sitting without masses, retractions, discharge or axillary adenopathy. Pelvic:  Ext, BUS, Vagina: Normal  Cervix: Normal.  Pap smear/HPV  Uterus: Anteverted, normal size, shape and contour, midline and mobile nontender   Adnexa: Without masses or tenderness    Anus and perineum: Normal   Rectovaginal: Normal sphincter tone without palpated masses or tenderness.    Assessment/Plan:  42 y.o. G30P0011 female for annual gynecologic exam with regular menses, oral contraceptives.   1. Oral contraceptives.  Doing well and wants to continue.  Not being followed for medical issues.  Never smoked.  Refill x1 year provided. 2. Pap smear/HPV 05/2014.  Pap smear/HPV today.  No history of abnormal Pap smears previously. 3. Mammography 07/2017.  Recommend screening mammography this coming year and she agrees to schedule.  Breast exam normal today. 4. Health maintenance.  Patient reports routine blood work done at Dr. Erick Blinks office.   Follow-up 1 year, sooner as needed.   [name] MD, 11:47 AM 09/07/2018

## 2018-09-08 LAB — PAP, TP IMAGING W/ HPV RNA, RFLX HPV TYPE 16,18/45: HPV DNA High Risk: NOT DETECTED

## 2018-09-13 ENCOUNTER — Ambulatory Visit
Admission: RE | Admit: 2018-09-13 | Discharge: 2018-09-13 | Disposition: A | Discharge status: Home / Self Care | Payer: BLUE CROSS/BLUE SHIELD | Source: Ambulatory Visit | Attending: Orthopaedic Surgery | Admitting: Orthopaedic Surgery

## 2018-09-13 DIAGNOSIS — R221 Localized swelling, mass and lump, neck: Secondary | ICD-10-CM

## 2018-09-13 MED ORDER — GADOBENATE DIMEGLUMINE 529 MG/ML IV SOLN
10.0000 mL | Freq: Once | INTRAVENOUS | Status: AC | PRN
  Administered 2018-09-13: 10 mL via INTRAVENOUS

## 2018-10-06 ENCOUNTER — Other Ambulatory Visit: Payer: Self-pay

## 2018-10-06 ENCOUNTER — Ambulatory Visit (INDEPENDENT_AMBULATORY_CARE_PROVIDER_SITE_OTHER): Payer: Self-pay | Admitting: Family Medicine

## 2018-10-06 ENCOUNTER — Encounter: Payer: Self-pay | Admitting: Family Medicine

## 2018-10-06 VITALS — BP 128/82 | Temp 98.2°F | Ht 64.0 in | Wt 268.8 lb

## 2018-10-06 DIAGNOSIS — L089 Local infection of the skin and subcutaneous tissue, unspecified: Secondary | ICD-10-CM

## 2018-10-06 MED ORDER — AMOXICILLIN-POT CLAVULANATE 875-125 MG PO TABS: 1.0000 | ORAL_TABLET | Freq: Two times a day (BID) | ORAL | 0 refills | Status: AC

## 2018-10-06 NOTE — Progress Notes (Signed)
  Subjective:     Patient ID: Ashley Henry, female   DOB: 24-Mar-1976, 43 y.o.   MRN: [id number]  HPI Patient is seen with some drainage left fourth finger.  She was seen here on the 17th with some irritation after a small cut which was inflicted by manicurist about 3 weeks prior.  She was prescribed Bactroban.  She did not see much improvement.  She had been using peroxide and Neosporin and she was instructed to leave those off.  She has had some purulent drainage intermittently.  Minimal pain and burning.  No fevers or chills.  She had leftover Augmentin and took 2 days worth of that last week and the finger seem to be drying up but then she ran out of antibiotic.  Denies any past history of MRSA.  Past Medical History:  Diagnosis Date  . Anemia   . HYPERLIPIDEMIA 10/11/2009  . Hypertension 2010   no meds now  . Kidney stones   . Pre-diabetes    HgbA1c 6.2 on 09-30-17  . Renal disorder    Past Surgical History:  Procedure Laterality Date  . BREAST REDUCTION SURGERY  1999  . CESAREAN SECTION  2004  . Cysts removed from chest wall    . EYE SURGERY Left   . MASS EXCISION N/A 04/14/2018   Procedure: EXCISION OF CHEST WALL MASS;  Surgeon: Coralie Keens, MD;  Location: [facility];  Service: General;  Laterality: N/A;    reports that she has never smoked. She has never used smokeless tobacco. She reports that she does not drink alcohol or use drugs. family history includes Diabetes in her mother; Hypertension in her father and mother; Lung cancer in her maternal grandfather; Prostate cancer in her maternal uncle; Stomach cancer in her maternal uncle and paternal grandmother; Thyroid disease in her maternal uncle. Allergies  Allergen Reactions  . Multihance [Gadobenate] Nausea And Vomiting  . Amlodipine Nausea Only  . Collagenase Hives  . Collagenase Clostridium Histolyticum Hives  . Aspirin Rash     Review of Systems  Constitutional: Negative for chills and fever.        Objective:   Physical Exam Constitutional:      Appearance: Normal appearance.  Cardiovascular:     Rate and Rhythm: Normal rate and regular rhythm.  Skin:    Comments: Left fourth finger reveals minimal purulent drainage from around the base of the nail.  No pustules.  Nonfluctuant.  No erythema.  No cellulitis changes.  No evidence for paronychia  Neurological:     Mental Status: She is alert.        Assessment:     Persistent infection left fourth finger    Plan:     -Augmentin 875 mg twice daily for 10 days since she seemed to be responding this last week but only had 2 days worth -We did obtain swab for wound culture -Recommend warm salt water soaks and touch base in 2 weeks if not resolving  Eulas Post MD [facility] at [facility]

## 2018-10-06 NOTE — Patient Instructions (Signed)
Give me some feedback if not better in 2 weeks  Consider some warm salt water soaks.

## 2018-10-09 LAB — WOUND CULTURE
MICRO NUMBER:: 83443
SPECIMEN QUALITY:: ADEQUATE

## 2019-04-13 ENCOUNTER — Encounter: Payer: Self-pay | Admitting: Family Medicine

## 2019-04-13 ENCOUNTER — Ambulatory Visit (INDEPENDENT_AMBULATORY_CARE_PROVIDER_SITE_OTHER): Payer: Self-pay | Admitting: Family Medicine

## 2019-04-13 ENCOUNTER — Other Ambulatory Visit: Payer: Self-pay

## 2019-04-13 VITALS — BP 140/88 | HR 95 | Temp 98.9°F | Ht 64.0 in | Wt 227.8 lb

## 2019-04-13 DIAGNOSIS — R609 Edema, unspecified: Secondary | ICD-10-CM

## 2019-04-13 MED ORDER — AMOXICILLIN-POT CLAVULANATE 875-125 MG PO TABS: 1.0000 | ORAL_TABLET | Freq: Two times a day (BID) | ORAL | 0 refills | Status: DC

## 2019-04-13 NOTE — Progress Notes (Signed)
  Subjective:     Patient ID: Ashley Henry, female   DOB: 1976-05-11, 43 y.o.   MRN: [id number]  HPI Patient is seen with some swelling left side of neck.  She states she had been to dentist and they had treated her with Keflex for presumed infection.  Her swelling is mostly left submandibular region.  She has some mild tenderness.  No fever.  She is not noted any overlying erythema.  Patient has done tremendous job with weight loss.  She has lost about 58 pounds with diet and exercise and feels good overall  Past Medical History:  Diagnosis Date  . Anemia   . HYPERLIPIDEMIA 10/11/2009  . Hypertension 2010   no meds now  . Kidney stones   . Pre-diabetes    HgbA1c 6.2 on 09-30-17  . Renal disorder    Past Surgical History:  Procedure Laterality Date  . BREAST REDUCTION SURGERY  1999  . CESAREAN SECTION  2004  . Cysts removed from chest wall    . EYE SURGERY Left   . MASS EXCISION N/A 04/14/2018   Procedure: EXCISION OF CHEST WALL MASS;  Surgeon: Coralie Keens, MD;  Location: [place];  Service: General;  Laterality: N/A;    reports that she has never smoked. She has never used smokeless tobacco. She reports that she does not drink alcohol or use drugs. family history includes Diabetes in her mother; Hypertension in her father and mother; Lung cancer in her maternal grandfather; Prostate cancer in her maternal uncle; Stomach cancer in her maternal uncle and paternal grandmother; Thyroid disease in her maternal uncle. Allergies  Allergen Reactions  . Multihance [Gadobenate] Nausea And Vomiting  . Amlodipine Nausea Only  . Collagenase Hives  . Collagenase Clostridium Histolyticum Hives  . Aspirin Rash     Review of Systems  Constitutional: Negative for chills and fever.  HENT: Negative for ear pain and facial swelling.   Skin: Negative for rash.  Hematological: Negative for adenopathy.       Objective:   Physical Exam Constitutional:      Appearance:  Normal appearance.  Neck:     Musculoskeletal: Neck supple.     Comments: She has somewhat prominent left submandibular gland compared to the right.  No parotid swelling.  No overlying warmth or erythema.  No neck adenopathy noted Neurological:     Mental Status: She is alert.        Assessment:     Left submandibular swelling which is relatively mild.  No objective evidence for dental abscess No cellulitis changes    Plan:     -Recommend good hydration and also try some sialagogues to improve salivation -Augmentin 875 mg twice daily for 10 days and take with food  Eulas Post MD [facility] at [facility]

## 2019-04-27 ENCOUNTER — Other Ambulatory Visit: Payer: Self-pay | Admitting: Family Medicine

## 2019-04-27 NOTE — Telephone Encounter (Signed)
See note

## 2019-04-27 NOTE — Telephone Encounter (Signed)
Patient would like a call back regarding denial of medication because she did complete the course, symptoms improved, but there are still lingering symptoms so she needs additional treatment. Please advise.

## 2019-04-27 NOTE — Telephone Encounter (Signed)
Patient should be finished with this. OK to decline?

## 2019-04-27 NOTE — Telephone Encounter (Signed)
Called patient and she stated that her lymp nodes are not as swollen but they are still a little swollen. Patient thinks she needs more antibiotics. Please advise.

## 2019-04-27 NOTE — Telephone Encounter (Signed)
Would not refill the Augmentin.

## 2019-05-12 NOTE — Telephone Encounter (Signed)
Spoke with patient and she has an appointment with an oral surgeon to be evaluated.

## 2019-05-19 ENCOUNTER — Encounter: Payer: Self-pay | Admitting: Family Medicine

## 2019-05-20 ENCOUNTER — Telehealth: Payer: Self-pay | Admitting: *Deleted

## 2019-05-20 DIAGNOSIS — R609 Edema, unspecified: Secondary | ICD-10-CM

## 2019-05-20 NOTE — Telephone Encounter (Signed)
Spoke with patient and relayed message. Per patient oral surgeon informed her that d/t location it cant be a stone and that she probably needs blood work done to check her WBC. Patient reports she does not have any insurance at this time and is paying out of pocket for visits so she does not want to waist money. Please advise if she needs to come in for blood work, Social research officer, government.

## 2019-05-20 NOTE — Telephone Encounter (Signed)
Received a call from Dr. Kendrick Fries from [facility]. He reports he seen the patient for left sided lymph node swelling. He had did an extraction of patient wisdom teeth in 2017.  He did see patient and did a dental CT without contrast and it was negative. Please advise the next steps.

## 2019-05-20 NOTE — Telephone Encounter (Signed)
If her submandibular gland swelling persists and not improved with antibiotics, good hydration, etc recommend ENT referral.  She may have a stone or sludge that is impeding drainage.

## 2019-05-21 ENCOUNTER — Telehealth: Payer: Self-pay | Admitting: Family Medicine

## 2019-05-21 DIAGNOSIS — R220 Localized swelling, mass and lump, head: Secondary | ICD-10-CM

## 2019-05-21 DIAGNOSIS — R221 Localized swelling, mass and lump, neck: Secondary | ICD-10-CM

## 2019-05-21 NOTE — Telephone Encounter (Signed)
OK to place order for CBC- but I don't think that is going to resolve the issue.   When we saw her this seemed to be more submandibular.

## 2019-05-21 NOTE — Telephone Encounter (Signed)
Patient called with some persistent submandibular swelling.  She saw oral surgeon who suggested she get CBC.  I explained that we could place future order for CBC certainly but not sure how much this was help diagnostically.  She will schedule early next week to get CBC drawn.  We had earlier suggested possible ENT referral for persistent submandibular issue

## 2019-05-27 NOTE — Telephone Encounter (Signed)
Clinic RN called patient. No answer. LVM for patient to return call. CBC order has been placed.

## 2019-05-28 ENCOUNTER — Other Ambulatory Visit: Payer: Self-pay

## 2019-05-31 ENCOUNTER — Other Ambulatory Visit (INDEPENDENT_AMBULATORY_CARE_PROVIDER_SITE_OTHER): Payer: Self-pay

## 2019-05-31 ENCOUNTER — Other Ambulatory Visit: Payer: Self-pay

## 2019-05-31 DIAGNOSIS — R220 Localized swelling, mass and lump, head: Secondary | ICD-10-CM

## 2019-05-31 DIAGNOSIS — R221 Localized swelling, mass and lump, neck: Secondary | ICD-10-CM

## 2019-05-31 LAB — CBC WITH DIFFERENTIAL/PLATELET
Basophils Absolute: 0.1 10*3/uL (ref 0.0–0.1)
Basophils Relative: 1.7 % (ref 0.0–3.0)
Eosinophils Absolute: 0.1 10*3/uL (ref 0.0–0.7)
Eosinophils Relative: 1.2 % (ref 0.0–5.0)
HCT: 31.1 % — ABNORMAL LOW (ref 36.0–46.0)
Hemoglobin: 10.2 g/dL — ABNORMAL LOW (ref 12.0–15.0)
Lymphocytes Relative: 22.2 % (ref 12.0–46.0)
Lymphs Abs: 1.7 10*3/uL (ref 0.7–4.0)
MCHC: 32.9 g/dL (ref 30.0–36.0)
MCV: 67.5 fl — ABNORMAL LOW (ref 78.0–100.0)
Monocytes Absolute: 0.6 10*3/uL (ref 0.1–1.0)
Monocytes Relative: 8.1 % (ref 3.0–12.0)
Neutro Abs: 5.2 10*3/uL (ref 1.4–7.7)
Neutrophils Relative %: 66.8 % (ref 43.0–77.0)
Platelets: 345 10*3/uL (ref 150.0–400.0)
RBC: 4.61 Mil/uL (ref 3.87–5.11)
RDW: 18.1 % — ABNORMAL HIGH (ref 11.5–15.5)
WBC: 7.9 10*3/uL (ref 4.0–10.5)

## 2019-06-08 ENCOUNTER — Encounter: Payer: Self-pay | Admitting: Gynecology

## 2019-08-04 ENCOUNTER — Other Ambulatory Visit: Payer: Self-pay

## 2019-08-04 ENCOUNTER — Encounter: Payer: Self-pay | Admitting: Family Medicine

## 2019-08-04 ENCOUNTER — Ambulatory Visit (INDEPENDENT_AMBULATORY_CARE_PROVIDER_SITE_OTHER): Payer: Self-pay | Admitting: Family Medicine

## 2019-08-04 VITALS — BP 138/80 | HR 96 | Temp 97.7°F | Ht 64.0 in | Wt 217.1 lb

## 2019-08-04 DIAGNOSIS — L509 Urticaria, unspecified: Secondary | ICD-10-CM

## 2019-08-04 MED ORDER — PREDNISONE 10 MG PO TABS: ORAL_TABLET | ORAL | 0 refills | Status: DC

## 2019-08-04 NOTE — Patient Instructions (Signed)
Hives Hives (urticaria) are itchy, red, swollen areas on the skin. Hives can appear on any part of the body. Hives often fade within 24 hours (acute hives). Sometimes, new hives appear after old ones fade and the cycle can continue for several days or weeks (chronic hives). Hives do not spread from person to person (are not contagious). Hives come from the body's reaction to something a person is allergic to (allergen), something that causes irritation, or various other triggers. When a person is exposed to a trigger, his or her body releases a chemical (histamine) that causes redness, itching, and swelling. Hives can appear right after exposure to a trigger or hours later. What are the causes? This condition may be caused by:  Allergies to foods or ingredients.  Insect bites or stings.  Exposure to pollen or pets.  Contact with latex or chemicals.  Spending time in sunlight, heat, or cold (exposure).  Exercise.  Stress.  Certain medicines. You can also get hives from other medical conditions and treatments, such as:  Viruses, including the common cold.  Bacterial infections, such as urinary tract infections and strep throat.  Certain medicines.  Allergy shots.  Blood transfusions. Sometimes, the cause of this condition is not known (idiopathic hives). What increases the risk? You are more likely to develop this condition if you:  Are a woman.  Have food allergies, especially to citrus fruits, milk, eggs, peanuts, tree nuts, or shellfish.  Are allergic to: ? Medicines. ? Latex. ? Insects. ? Animals. ? Pollen. What are the signs or symptoms? Common symptoms of this condition include raised, itchy, red or white bumps or patches on your skin. These areas may:  Become large and swollen (welts).  Change in shape and location, quickly and repeatedly.  Be separate hives or connect over a large area of skin.  Sting or become painful.  Turn white when pressed in the  center (blanch). In severe cases, yourhands, feet, and face may also become swollen. This may occur if hives develop deeper in your skin. How is this diagnosed? This condition may be diagnosed by your symptoms, medical history, and physical exam.  Your skin, urine, or blood may be tested to find out what is causing your hives and to rule out other health issues.  Your health care provider may also remove a small sample of skin from the affected area and examine it under a microscope (biopsy). How is this treated? Treatment for this condition depends on the cause and severity of your symptoms. Your health care provider may recommend using cool, wet cloths (cool compresses) or taking cool showers to relieve itching. Treatment may include:  Medicines that help: ? Relieve itching (antihistamines). ? Reduce swelling (corticosteroids). ? Treat infection (antibiotics).  An injectable medicine (omalizumab). Your health care provider may prescribe this if you have chronic idiopathic hives and you continue to have symptoms even after treatment with antihistamines. Severe cases may require an emergency injection of adrenaline (epinephrine) to prevent a life-threatening allergic reaction (anaphylaxis). Follow these instructions at home: Medicines  Take and apply over-the-counter and prescription medicines only as told by your health care provider.  If you were prescribed an antibiotic medicine, take it as told by your health care provider. Do not stop using the antibiotic even if you start to feel better. Skin care  Apply cool compresses to the affected areas.  Do not scratch or rub your skin. General instructions  Do not take hot showers or baths. This can make itching   worse.  Do not wear tight-fitting clothing.  Use sunscreen and wear protective clothing when you are outside.  Avoid any substances that cause your hives. Keep a journal to help track what causes your hives. Write down: ?  What medicines you take. ? What you eat and drink. ? What products you use on your skin.  Keep all follow-up visits as told by your health care provider. This is important. Contact a health care provider if:  Your symptoms are not controlled with medicine.  Your joints are painful or swollen. Get help right away if:  You have a fever.  You have pain in your abdomen.  Your tongue or lips are swollen.  Your eyelids are swollen.  Your chest or throat feels tight.  You have trouble breathing or swallowing. These symptoms may represent a serious problem that is an emergency. Do not wait to see if the symptoms will go away. Get medical help right away. Call your local emergency services (911 in the [place] Do not drive yourself to the hospital. Summary  Hives (urticaria) are itchy, red, swollen areas on your skin. Hives come from the body's reaction to something a person is allergic to (allergen), something that causes irritation, or various other triggers.  Treatment for this condition depends on the cause and severity of your symptoms.  Avoid any substances that cause your hives. Keep a journal to help track what causes your hives.  Take and apply over-the-counter and prescription medicines only as told by your health care provider.  Keep all follow-up visits as told by your health care provider. This is important. This information is not intended to replace advice given to you by your health care provider. Make sure you discuss any questions you have with your health care provider. Document Released: 09/02/2005 Document Revised: 03/18/2018 Document Reviewed: 03/18/2018 Elsevier Patient Education  Hallam with benadryl- especially at night  Consider adding Xyzal 5 mg once day  Consider OTC pepcid 20 mg twice daily

## 2019-08-05 NOTE — Progress Notes (Signed)
  Subjective:     Patient ID: Ashley Henry, female   DOB: 1975-09-19, 43 y.o.   MRN: [id number]  HPI  Ashley Henry is seen with one month hx of hives- mostly upper extremities and buttock with some recent involvement of thighs.  No new soaps or detergents. Only medications are oral contraception and Bepreve eye drop- neither are new.  She used topical benadryl with some relief.  No relief with hydrocortisone cream or oral anti-histamines.  No recent fever.  Lesions are raised, pruritic, non-scaly.  No clear triggers.  No angioedema symptoms.    Past Medical History:  Diagnosis Date  . Anemia   . HYPERLIPIDEMIA 10/11/2009  . Hypertension 2010   no meds now  . Kidney stones   . Pre-diabetes    HgbA1c 6.2 on 09-30-17  . Renal disorder    Past Surgical History:  Procedure Laterality Date  . BREAST REDUCTION SURGERY  1999  . CESAREAN SECTION  2004  . Cysts removed from chest wall    . EYE SURGERY Left   . MASS EXCISION N/A 04/14/2018   Procedure: EXCISION OF CHEST WALL MASS;  Surgeon: Coralie Keens, MD;  Location: [facility];  Service: General;  Laterality: N/A;    reports that she has never smoked. She has never used smokeless tobacco. She reports that she does not drink alcohol or use drugs. family history includes Diabetes in her mother; Hypertension in her father and mother; Lung cancer in her maternal grandfather; Prostate cancer in her maternal uncle; Stomach cancer in her maternal uncle and paternal grandmother; Thyroid disease in her maternal uncle. Allergies  Allergen Reactions  . Multihance [Gadobenate] Nausea And Vomiting  . Amlodipine Nausea Only  . Collagenase Hives  . Collagenase Clostridium Histolyticum Hives  . Aspirin Rash     Review of Systems  Constitutional: Negative for appetite change, chills, fever and unexpected weight change.  Respiratory: Negative for shortness of breath.   Cardiovascular: Negative for chest pain.  Skin: Positive for rash.   Hematological: Negative for adenopathy.       Objective:   Physical Exam Vitals signs reviewed.  Cardiovascular:     Rate and Rhythm: Normal rate and regular rhythm.  Skin:    Findings: Rash present.     Comments: She has several raised, non-scaly, non-tender urticarial lesions- mostly lateral aspect of both arms.        Assessment:     Hives.  Etiology unclear    Plan:     -continue with oral anti-histamine -consider add Pepcid 20 mg bid -may continue topical benadryl -prednisone taper over a week -be in touch for recurrent/persistent rash  Eulas Post MD [facility] at [facility]

## 2019-08-20 ENCOUNTER — Other Ambulatory Visit: Payer: Self-pay

## 2019-08-20 ENCOUNTER — Encounter: Payer: Self-pay | Admitting: Family Medicine

## 2019-08-20 MED ORDER — TRIAMCINOLONE 0.1 % CREAM:EUCERIN CREAM 1:1: 1.0000 "application " | TOPICAL_CREAM | Freq: Two times a day (BID) | CUTANEOUS | 1 refills | Status: DC | PRN

## 2019-09-06 ENCOUNTER — Other Ambulatory Visit: Payer: Self-pay

## 2019-09-06 MED ORDER — NORGESTIM-ETH ESTRAD TRIPHASIC 0.18/0.215/0.25 MG-25 MCG PO TABS: 1.0000 | ORAL_TABLET | Freq: Every day | ORAL | 2 refills | Status: DC

## 2019-09-13 ENCOUNTER — Encounter: Payer: BLUE CROSS/BLUE SHIELD | Admitting: Gynecology

## 2019-09-14 ENCOUNTER — Encounter: Payer: BLUE CROSS/BLUE SHIELD | Admitting: Gynecology

## 2019-09-30 ENCOUNTER — Other Ambulatory Visit: Payer: Self-pay

## 2019-10-01 ENCOUNTER — Other Ambulatory Visit: Payer: Self-pay

## 2019-10-01 ENCOUNTER — Encounter: Payer: Self-pay | Admitting: Family Medicine

## 2019-10-01 ENCOUNTER — Ambulatory Visit (INDEPENDENT_AMBULATORY_CARE_PROVIDER_SITE_OTHER): Payer: 59 | Admitting: Family Medicine

## 2019-10-01 VITALS — BP 134/86 | HR 99 | Temp 97.8°F | Ht 64.0 in | Wt 209.6 lb

## 2019-10-01 DIAGNOSIS — R062 Wheezing: Secondary | ICD-10-CM

## 2019-10-01 DIAGNOSIS — R221 Localized swelling, mass and lump, neck: Secondary | ICD-10-CM

## 2019-10-01 MED ORDER — FLOVENT HFA 44 MCG/ACT IN AERO: 2.0000 | INHALATION_SPRAY | Freq: Two times a day (BID) | RESPIRATORY_TRACT | 12 refills | Status: DC

## 2019-10-01 MED ORDER — ALBUTEROL SULFATE HFA 108 (90 BASE) MCG/ACT IN AERS: 2.0000 | INHALATION_SPRAY | Freq: Four times a day (QID) | RESPIRATORY_TRACT | 1 refills | Status: DC | PRN

## 2019-10-01 NOTE — Patient Instructions (Signed)
Will set up imaging to assess left neck mass.

## 2019-10-01 NOTE — Progress Notes (Signed)
Subjective:     Patient ID: Ashley Henry, female   DOB: Jul 20, 1976, 44 y.o.   MRN: [id number]  HPI   Ruthmae is seen with some persistent swelling left submandibular region going on now for many months.  She first noted around last March.  She was concerned this may be enlarged lymph node.  She has not noted any other adenopathy.  She denies any parotid swelling.  She has history of sarcoid and had concerns about whether this could be related to sarcoid as she had read that could be salivary gland involvement.  She has never noted any redness or warmth.  She has not noted any increased swelling after eating.  Generally feels well overall.  She has lost 78 pounds this year due to her efforts.  Good appetite.  She is exercising with walking over an hour a day and feels much better overall.  Another issue is that she has noting increased wheezing over several months.  She states her mom had adult onset asthma.  She notices wheezing frequently with exercise especially after walking on the treadmill and frequently at night usually several nights per week.  She is asking for rescue inhaler.  No prior diagnosis of asthma.  No fevers or chills.  Past Medical History:  Diagnosis Date  . Anemia   . HYPERLIPIDEMIA 10/11/2009  . Hypertension 2010   no meds now  . Kidney stones   . Pre-diabetes    HgbA1c 6.2 on 09-30-17  . Renal disorder    Past Surgical History:  Procedure Laterality Date  . BREAST REDUCTION SURGERY  1999  . CESAREAN SECTION  2004  . Cysts removed from chest wall    . EYE SURGERY Left   . MASS EXCISION N/A 04/14/2018   Procedure: EXCISION OF CHEST WALL MASS;  Surgeon: Coralie Keens, MD;  Location: [facility];  Service: General;  Laterality: N/A;    reports that she has never smoked. She has never used smokeless tobacco. She reports that she does not drink alcohol or use drugs. family history includes Diabetes in her mother; Hypertension in her father and mother;  Lung cancer in her maternal grandfather; Prostate cancer in her maternal uncle; Stomach cancer in her maternal uncle and paternal grandmother; Thyroid disease in her maternal uncle. Allergies  Allergen Reactions  . Multihance [Gadobenate] Nausea And Vomiting  . Amlodipine Nausea Only  . Collagenase Hives  . Collagenase Clostridium Histolyticum Hives  . Aspirin Rash     Review of Systems  Constitutional: Negative for chills and fever.  HENT: Negative for congestion.   Respiratory: Positive for wheezing. Negative for cough.   Cardiovascular: Negative for chest pain, palpitations and leg swelling.  Hematological: Does not bruise/bleed easily.       Objective:   Physical Exam Vitals reviewed.  Constitutional:      Appearance: Normal appearance.  Neck:     Comments: She has firm mass somewhat irregular contour 3 x 2 cm roughly left submandibular region.  No other neck adenopathy noted.  No supraclavicular adenopathy Cardiovascular:     Rate and Rhythm: Normal rate and regular rhythm.  Pulmonary:     Effort: Pulmonary effort is normal.     Breath sounds: Normal breath sounds.  Musculoskeletal:     Cervical back: Neck supple.  Neurological:     Mental Status: She is alert.        Assessment:     #1 left neck mass.  Mass palpated is in  the submandibular region.  Present for several months and slowly enlarging by history..  Doubt epidermal cyst  #2 frequent wheezing.  Based on frequency of several times weekly may have at least moderate persistent asthma      Plan:     -Prescription for ProAir rescue inhaler 2 puffs every 6 hours as needed for wheezing -Consider trial of Flovent 44 mcg 2 puffs twice daily and be sure to rinse mouth after use -Bring back to reassess lung issue in 3 weeks after initiating the above -Set up CT neck to further assess mass above.  May need ENT referral  Eulas Post MD [facility] at [facility]

## 2019-10-12 ENCOUNTER — Encounter: Payer: Self-pay | Admitting: Family Medicine

## 2019-10-20 ENCOUNTER — Encounter: Payer: Self-pay | Admitting: Family Medicine

## 2019-10-21 ENCOUNTER — Ambulatory Visit
Admission: RE | Admit: 2019-10-21 | Discharge: 2019-10-21 | Disposition: A | Discharge status: Home / Self Care | Payer: 59 | Source: Ambulatory Visit | Attending: Family Medicine | Admitting: Family Medicine

## 2019-10-21 ENCOUNTER — Telehealth: Payer: Self-pay | Admitting: *Deleted

## 2019-10-21 DIAGNOSIS — R221 Localized swelling, mass and lump, neck: Secondary | ICD-10-CM

## 2019-10-21 MED ORDER — IOPAMIDOL (ISOVUE-300) INJECTION 61%
75.0000 mL | Freq: Once | INTRAVENOUS | Status: AC | PRN
  Administered 2019-10-21: 15:00:00 75 mL via INTRAVENOUS

## 2019-10-21 NOTE — Telephone Encounter (Signed)
FYI - Call Report   Skin markers overlie normal appearing left parotid and submandibular glands. There is no neck mass or adenopathy.  6 mm right upper lobe nodule. Non-contrast chest CT at 6-12 months is recommended. If the nodule is stable at time of repeat CT, then future CT at 18-24 months (from today's scan) is considered optional for low-risk patients, but is recommended for high-risk patients. This recommendation follows the consensus statement: Guidelines for Management of Incidental Pulmonary Nodules Detected on CT Images: From the Fleischner Society 2017; Radiology 2017; 284:228-243.  These results will be called to the ordering clinician or representative by the Radiologist Assistant, and communication documented in the PACS or zVision Dashboard.

## 2019-10-22 ENCOUNTER — Encounter: Payer: Self-pay | Admitting: Family Medicine

## 2019-10-22 NOTE — Telephone Encounter (Signed)
Patient is aware.  Doxy scheduled.  Patient will mychart where she would like her link sent.

## 2019-10-22 NOTE — Telephone Encounter (Signed)
Left message with pt.   No facial mass.  Does have nodule as noted and will recommend 6-12 month follow up CT.

## 2019-10-25 ENCOUNTER — Telehealth (INDEPENDENT_AMBULATORY_CARE_PROVIDER_SITE_OTHER): Payer: 59 | Admitting: Family Medicine

## 2019-10-25 ENCOUNTER — Other Ambulatory Visit: Payer: Self-pay

## 2019-10-25 DIAGNOSIS — R911 Solitary pulmonary nodule: Secondary | ICD-10-CM | POA: Diagnosis not present

## 2019-10-25 DIAGNOSIS — R220 Localized swelling, mass and lump, head: Secondary | ICD-10-CM | POA: Diagnosis not present

## 2019-10-25 NOTE — Progress Notes (Signed)
This visit type was conducted due to national recommendations for restrictions regarding the COVID-19 pandemic in an effort to limit this patient's exposure and mitigate transmission in our community.   Virtual Visit via Telephone Note  I connected with Ashley Henry on 10/25/19 at  1:15 PM EST by telephone and verified that I am speaking with the correct person using two identifiers.   I discussed the limitations, risks, security and privacy concerns of performing an evaluation and management service by telephone and the availability of in person appointments. I also discussed with the patient that there may be a patient responsible charge related to this service. The patient expressed understanding and agreed to proceed.  Location patient: home Location provider: work or home office Participants present for the call: patient, provider Patient did not have a visit in the prior 7 days to address this/these issue(s).   History of Present Illness: Ashley Henry was scheduled to follow-up recent CT neck.  She had noted several months ago left submandibular mass.  We had also confirmed this on exam She had not noted any adenopathy.  No recent fever.  No appetite changes.  She has lost significant amount of weight due to her efforts at dietary change and increased exercise.  Her CT neck did not confirm any submandibular or parotid or neck masses and no significant adenopathy.  There was incidental note of right upper lobe nodule 6 mm.  Beva has never smoked.  She did have a grandfather that had lung cancer  she is not any recent cough or dyspnea.  No hemoptysis.  Past Medical History:  Diagnosis Date  . Anemia   . HYPERLIPIDEMIA 10/11/2009  . Hypertension 2010   no meds now  . Kidney stones   . Pre-diabetes    HgbA1c 6.2 on 09-30-17  . Renal disorder    Past Surgical History:  Procedure Laterality Date  . BREAST REDUCTION SURGERY  1999  . CESAREAN SECTION  2004  . Cysts removed from chest wall     . EYE SURGERY Left   . MASS EXCISION N/A 04/14/2018   Procedure: EXCISION OF CHEST WALL MASS;  Surgeon: Coralie Keens, MD;  Location: [facility];  Service: General;  Laterality: N/A;    reports that she has never smoked. She has never used smokeless tobacco. She reports that she does not drink alcohol or use drugs. family history includes Diabetes in her mother; Hypertension in her father and mother; Lung cancer in her maternal grandfather; Prostate cancer in her maternal uncle; Stomach cancer in her maternal uncle and paternal grandmother; Thyroid disease in her maternal uncle. Allergies  Allergen Reactions  . Multihance [Gadobenate] Nausea And Vomiting  . Amlodipine Nausea Only  . Collagenase Hives  . Collagenase Clostridium Histolyticum Hives  . Aspirin Rash   ' Observations/Objective: Patient sounds cheerful and well on the phone. I do not appreciate any SOB. Speech and thought processing are grossly intact. Patient reported vitals:  Assessment and Plan:  #1 patient complaining of persistent left submandibular mass not confirmed on recent CT scan  -We discussed setting up ENT referral and she would like to proceed in that direction  #2  6 mm right upper lobe lung nodule in a low risk patient  -Per recommendations of radiology will set up 79-month noncontrast CT chest.  If stable at that point either no further evaluation or possibly 18 to 54-month follow-up from February 2021  Follow Up Instructions:  -As above   [id number] 5-10 [phone]  11-20 [id number] 21-30 I did not refer this patient for an OV in the next 24 hours for this/these issue(s).  I discussed the assessment and treatment plan with the patient. The patient was provided an opportunity to ask questions and all were answered. The patient agreed with the plan and demonstrated an understanding of the instructions.   The patient was advised to call back or seek an in-person evaluation if the symptoms worsen  or if the condition fails to improve as anticipated.  I provided 23 minutes of non-face-to-face time during this encounter.   Carolann Littler, MD

## 2019-11-04 ENCOUNTER — Ambulatory Visit (INDEPENDENT_AMBULATORY_CARE_PROVIDER_SITE_OTHER): Payer: 59 | Admitting: Otolaryngology

## 2019-11-04 ENCOUNTER — Encounter: Payer: Self-pay | Admitting: Obstetrics and Gynecology

## 2019-11-10 ENCOUNTER — Telehealth: Payer: Self-pay | Admitting: Cardiology

## 2019-11-10 NOTE — Telephone Encounter (Signed)
Patient calling stating her OBGYN sent a referral for her to see Dr. Bettina Gavia. She is calling to check on the referral and to schedule an appointment.

## 2019-11-15 NOTE — Telephone Encounter (Signed)
Left message for patient to return call.

## 2019-11-18 ENCOUNTER — Other Ambulatory Visit: Payer: Self-pay

## 2019-11-18 ENCOUNTER — Encounter (INDEPENDENT_AMBULATORY_CARE_PROVIDER_SITE_OTHER): Payer: Self-pay | Admitting: Otolaryngology

## 2019-11-18 ENCOUNTER — Ambulatory Visit (INDEPENDENT_AMBULATORY_CARE_PROVIDER_SITE_OTHER): Payer: 59 | Admitting: Otolaryngology

## 2019-11-18 VITALS — Temp 97.5°F

## 2019-11-18 DIAGNOSIS — R590 Localized enlarged lymph nodes: Secondary | ICD-10-CM | POA: Diagnosis not present

## 2019-11-18 NOTE — Progress Notes (Signed)
HPI: Ashley Henry is a 44 y.o. female who presents is referred by Dr. Elease Hashimoto for evaluation of left neck mass.  Patient states that she has had a left submandibular neck mass for about a year now.  It has not been tender.  She had a CT scan of the neck recently that was read as normal.  I reviewed the CT scan and she has a very slightly prominent left submandibular gland compared to the right with a small lymph node adjacent to the gland which appears benign.  No noted tumors or masses within the gland. She also complains of some pain or discomfort in her left jaw.  She has previously.  Seen her dentist as well as oral surgeon who extracted her wisdom tooth with normal evaluation and normal Panorex according to the patient.  Past Medical History:  Diagnosis Date  . Anemia   . HYPERLIPIDEMIA 10/11/2009  . Hypertension 2010   no meds now  . Kidney stones   . Pre-diabetes    HgbA1c 6.2 on 09-30-17  . Renal disorder    Past Surgical History:  Procedure Laterality Date  . BREAST REDUCTION SURGERY  1999  . CESAREAN SECTION  2004  . Cysts removed from chest wall    . EYE SURGERY Left   . MASS EXCISION N/A 04/14/2018   Procedure: EXCISION OF CHEST WALL MASS;  Surgeon: Coralie Keens, MD;  Location: [facility];  Service: General;  Laterality: N/A;   Social History   Socioeconomic History  . Marital status: Married    Spouse name: [name]  . Number of children: [name]  . Years of education: [name]  . Highest education level: [name]  Occupational History  . [name]  Tobacco Use  . Smoking status: Never Smoker  . Smokeless tobacco: Never Used  Substance and Sexual Activity  . Alcohol use: No    Alcohol/week: 0.0 standard drinks  . Drug use: No  . Sexual activity: Yes    Birth control/protection: Pill, Condom    Comment: 1st intercourse 44 yo-Fewer than 5 partners  Other Topics Concern  . [name]  Social History Narrative  . [name]    Social Determinants of Health   Financial Resource Strain:   . Difficulty of Paying Living Expenses: [name]  Food Insecurity:   . Worried About Charity fundraiser in the Last Year: [name]  . Ran Out of Food in the Last Year: [name]  Transportation Needs:   . Lack of Transportation (Medical): [name]  . Lack of Transportation (Non-Medical): [name]  Physical Activity:   . Days of Exercise per Week: [name]  . Minutes of Exercise per Session: [name]  Stress:   . Feeling of Stress : [name]  Social Connections:   . Frequency of Communication with Friends and Family: [name]  . Frequency of Social Gatherings with Friends and Family: [name]  . Attends Religious Services: [name]  . Active Member of Clubs or Organizations: [name]  . Attends Archivist Meetings: [name]  . Marital Status: [name]   Family History  Problem Relation Age of Onset  . Hypertension Mother   . Diabetes Mother   . Hypertension Father   . Stomach cancer Paternal Grandmother   . Stomach cancer Maternal Uncle   .  Lung cancer Maternal Grandfather   . Prostate cancer Maternal Uncle   . Thyroid disease Maternal Uncle    Allergies  Allergen Reactions  . Multihance [Gadobenate] Nausea And Vomiting  . Amlodipine Nausea Only  . Collagenase Hives  . Collagenase Clostridium Histolyticum Hives  . Aspirin Rash   Prior to Admission medications   Medication Sig Start Date End Date Taking? Authorizing Provider  albuterol (VENTOLIN HFA) 108 (90 Base) MCG/ACT inhaler Inhale 2 puffs into the lungs every 6 (six) hours as needed for wheezing or shortness of breath. 10/01/19  Yes Burchette, Alinda Sierras, MD  BEPREVE 1.5 % SOLN Place 1 drop into both eyes daily. 01/02/16  Yes [provider]  fluticasone (FLOVENT HFA) 44 MCG/ACT inhaler Inhale 2 puffs into the lungs 2 (two) times daily. 10/01/19  Yes Burchette, Alinda Sierras, MD  Norgestimate-Ethinyl  Estradiol Triphasic (ORTHO TRI-CYCLEN LO) 0.18/0.215/0.25 MG-25 MCG tab Take 1 tablet by mouth daily. 09/06/19  Yes Fontaine, Belinda Block, MD  Triamcinolone Acetonide (TRIAMCINOLONE 0.1 % CREAM : EUCERIN) CREA Apply 1 application topically 2 (two) times daily as needed. Use on affected area. 08/20/19  Yes Burchette, Alinda Sierras, MD     Positive ROS: Otherwise negative  All other systems have been reviewed and were otherwise negative with the exception of those mentioned in the HPI and as above.  Physical Exam: Constitutional: Alert, well-appearing, no acute distress Ears: External ears without lesions or tenderness. Ear canals are clear bilaterally with intact, clear TMs.  Nasal: External nose without lesions. Septum midline. Clear nasal passages Oral: Lips and gums without lesions. Tongue and palate mucosa without lesions.  Drainage from the submandibular duct is clear.  Oral mucosa is clear to evaluation.  Bimanual palpation of the left semitubular gland is nontender and not significantly enlarged posterior oropharynx clear. Neck: No palpable adenopathy or masses.  The left submandibular gland as well as a small lymph node adjacent to the gland is easily palpable on the left side compared to the right lymph node measures approximately 10 to 12 mm in size and is adjacent to the gland which is nontender. Respiratory: Breathing comfortably  Skin: No facial/neck lesions or rash noted.  Procedures  Assessment: Prominent left semitubular gland with adjacent benign lymph node.  Possibly related to previous gland infection a year ago.  Plan: At this point did not recommend any specific therapy concerning this unless it is bothersome to the patient.  I reviewed this whether and this will require surgery to remove the lymph node and/or the gland and she is not interested in surgery at this point.  Findings are consistent with benign process and would not necessarily perform any intervention unless the lymph  node enlarges or the gland becomes more bothersome.   Radene Journey, MD   CC:

## 2019-11-19 ENCOUNTER — Encounter: Payer: Self-pay | Admitting: Cardiology

## 2019-11-24 ENCOUNTER — Ambulatory Visit: Payer: 59 | Admitting: Cardiology

## 2019-11-25 NOTE — Progress Notes (Signed)
Cardiology Office Note:    Date:  11/26/2019   ID:  Ashley Henry, DOB June 24, 1976, MRN [id number]  PCP:  Eulas Post, MD  Cardiologist:  Shirlee More, MD   Referring MD: Eulas Post, MD  ASSESSMENT:    1. Heart murmur    PLAN:    In order of problems listed above:  1. Physical examination murmur is present age of onset physical exam characteristics differential diagnosis flow murmur of anemia versus bicuspid aortic valve echocardiogram ordered.  If normal follow-up as needed if abnormal start endocarditis prophylaxis and follow-up in 2 years. 2. Anemia I asked her to start over-the-counter iron with chewable children's vitamin.  Next appointment see above   Medication Adjustments/Labs and Tests Ordered: Current medicines are reviewed at length with the patient today.  Concerns regarding medicines are outlined above.  Orders Placed This Encounter  Procedures  . EKG 12-Lead  . ECHOCARDIOGRAM COMPLETE   No orders of the defined types were placed in this encounter.    Chief Complaint  Patient presents with  . Heart Murmur    History of Present Illness:    Ashley Henry is a 44 y.o. female who is being seen today for the evaluation of heart murmur at the request of Eulas Post, MD.  She is a clinical psychologist and just noticed to have a heart murmur on physical examination she also has mild iron deficiency anemia with a hemoglobin in the range of 10.  She has an uncle who had valvular heart disease and valve replacement.  Purposely has lost 80 pounds in the last year and has had no exercise intolerance exertional shortness of breath or chest pain at times she gets point localized momentary chest discomfort positional.  Has no known history of congenital rheumatic heart disease and did not have a heart murmur as a child.  Zickel examination shows a soft grade 1-2 localized ejection murmur aortic area no radiation no systolic click no aortic regurgitation  S2 is normal.  Differential diagnosis is flow murmur due to anemia versus bicuspid aortic valve echocardiogram is appropriate and will be ordered.  If she has a bicuspid valve we will start endocarditis prophylaxis follow-up in 2 to 3 years Past Medical History:  Diagnosis Date  . Anemia   . HYPERLIPIDEMIA 10/11/2009  . Hypertension 2010   no meds now  . Kidney stones   . Pre-diabetes    HgbA1c 6.2 on 09-30-17  . Renal disorder     Past Surgical History:  Procedure Laterality Date  . BREAST REDUCTION SURGERY  1999  . CESAREAN SECTION  2004  . Cysts removed from chest wall    . EYE SURGERY Left   . MASS EXCISION N/A 04/14/2018   Procedure: EXCISION OF CHEST WALL MASS;  Surgeon: Coralie Keens, MD;  Location: [facility];  Service: General;  Laterality: N/A;    Current Medications: Current Meds  Medication Sig  . BEPREVE 1.5 % SOLN Place 1 drop into both eyes daily.  . Norgestimate-Ethinyl Estradiol Triphasic (ORTHO TRI-CYCLEN LO) 0.18/0.215/0.25 MG-25 MCG tab Take 1 tablet by mouth daily.     Allergies:   Multihance [gadobenate], Amlodipine, Collagenase, Collagenase clostridium histolyticum, and Aspirin   Social History   Socioeconomic History  . Marital status: Married    Spouse name: [name]  . Number of children: [name]  . Years of education: [name]  . Highest education level: [name]  Occupational  History  . [name]  Tobacco Use  . Smoking status: Never Smoker  . Smokeless tobacco: Never Used  Substance and Sexual Activity  . Alcohol use: No    Alcohol/week: 0.0 standard drinks  . Drug use: No  . Sexual activity: Yes    Birth control/protection: Pill, Condom    Comment: 1st intercourse 44 yo-Fewer than 5 partners  Other Topics Concern  . [name]  Social History Narrative  . [name]   Social Determinants of Health   Financial Resource Strain:   . Difficulty of Paying Living Expenses:   Food Insecurity:   .  Worried About Charity fundraiser in the Last Year:   . Arboriculturist in the Last Year:   Transportation Needs:   . Film/video editor (Medical):   Marland Kitchen Lack of Transportation (Non-Medical):   Physical Activity:   . Days of Exercise per Week:   . Minutes of Exercise per Session:   Stress:   . Feeling of Stress :   Social Connections:   . Frequency of Communication with Friends and Family:   . Frequency of Social Gatherings with Friends and Family:   . Attends Religious Services:   . Active Member of Clubs or Organizations:   . Attends Archivist Meetings:   Marland Kitchen Marital Status:      Family History: The patient's family history includes Diabetes in her mother; Heart disease in her maternal uncle; Hypertension in her father and mother; Lung cancer in her maternal grandfather; Prostate cancer in her maternal uncle; Stomach cancer in her maternal uncle and paternal grandmother; Thyroid disease in her maternal uncle.  ROS:   Review of Systems  Constitution: Positive for weight loss.  HENT: Negative.   Eyes: Negative.   Cardiovascular: Positive for chest pain.  Respiratory: Negative.   Endocrine: Negative.   Hematologic/Lymphatic: Negative.   Skin: Negative.   Musculoskeletal: Negative.   Gastrointestinal: Negative.   Genitourinary: Negative.   Neurological: Negative.   Psychiatric/Behavioral: Negative.   Allergic/Immunologic: Negative.    Please see the history of present illness.     All other systems reviewed and are negative.  EKGs/Labs/Other Studies Reviewed:    The following studies were reviewed today:   EKG:  EKG is  ordered today.  The ekg ordered today is personally reviewed and demonstrates sinus rhythm and is normal  Recent Labs: 05/31/2019: Hemoglobin 10.2; Platelets 345.0  Recent Lipid Panel    Component Value Date/Time   CHOL 203 (H) 09/29/2017 1420   TRIG 101 09/29/2017 1420   HDL 46 (L) 09/29/2017 1420   CHOLHDL 4.4 09/29/2017 1420   VLDL  16 06/17/2016 1243   LDLCALC 137 (H) 09/29/2017 1420    Physical Exam:    VS:  BP 136/86   Pulse 90   Temp 98.1 F (36.7 C)   Ht 5' 3.5" (1.613 m)   Wt 202 lb (91.6 kg)   LMP 11/11/2019   BMI 35.22 kg/m     Wt Readings from Last 3 Encounters:  11/26/19 202 lb (91.6 kg)  10/28/19 210 lb (95.3 kg)  10/01/19 209 lb 9.6 oz (95.1 kg)     GEN:  Well nourished, well developed in no acute distress HEENT: Normal NECK: No JVD; No carotid bruits LYMPHATICS: No lymphadenopathy CARDIAC: Grade 1/6 to 2/6 localized soft midsystolic ejection murmur aortic area no click aortic regurgitation S2 is normal and does not radiate RRR, no rubs, gallops RESPIRATORY:  Clear to auscultation without rales, wheezing or rhonchi  ABDOMEN: Soft, non-tender, non-distended MUSCULOSKELETAL:  No edema; No deformity  SKIN: Warm and dry NEUROLOGIC:  Alert and oriented x 3 PSYCHIATRIC:  Normal affect     Signed, Shirlee More, MD  11/26/2019 9:41 AM    [place]

## 2019-11-26 ENCOUNTER — Ambulatory Visit (INDEPENDENT_AMBULATORY_CARE_PROVIDER_SITE_OTHER): Payer: 59 | Admitting: Cardiology

## 2019-11-26 ENCOUNTER — Other Ambulatory Visit: Payer: Self-pay

## 2019-11-26 ENCOUNTER — Encounter: Payer: Self-pay | Admitting: Cardiology

## 2019-11-26 VITALS — BP 136/86 | HR 90 | Temp 98.1°F | Ht 63.5 in | Wt 202.0 lb

## 2019-11-26 DIAGNOSIS — R011 Cardiac murmur, unspecified: Secondary | ICD-10-CM | POA: Diagnosis not present

## 2019-11-26 DIAGNOSIS — D500 Iron deficiency anemia secondary to blood loss (chronic): Secondary | ICD-10-CM

## 2019-11-26 NOTE — Patient Instructions (Signed)
Medication Instructions:  Your physician recommends that you continue on your current medications as directed. Please refer to the Current Medication list given to you today.  *If you need a refill on your cardiac medications before your next appointment, please call your pharmacy*   Lab Work: None ordered  If you have labs (blood work) drawn today and your tests are completely normal, you will receive your results only by: Marland Kitchen MyChart Message (if you have MyChart) OR . A paper copy in the mail If you have any lab test that is abnormal or we need to change your treatment, we will call you to review the results.   Testing/Procedures: Your physician has requested that you have an echocardiogram AT PATIENT'S CONVENIENCE.  Echocardiography is a painless test that uses sound waves to create images of your heart. It provides your doctor with information about the size and shape of your heart and how well your heart's chambers and valves are working. This procedure takes approximately one hour. There are no restrictions for this procedure.      Follow-Up: At [facility], you and your health needs are our priority.  As part of our continuing mission to provide you with exceptional heart care, we have created designated Provider Care Teams.  These Care Teams include your primary Cardiologist (physician) and Advanced Practice Providers (APPs -  Physician Assistants and Nurse Practitioners) who all work together to provide you with the care you need, when you need it.  We recommend signing up for the patient portal called "MyChart".  Sign up information is provided on this After Visit Summary.  MyChart is used to connect with patients for Virtual Visits (Telemedicine).  Patients are able to view lab/test results, encounter notes, upcoming appointments, etc.  Non-urgent messages can be sent to your provider as well.   To learn more about what you can do with MyChart, go to [email or web address].     Your next appointment:   As needed  The format for your next appointment:   In Person  Provider:   Shirlee More, MD   Other Instructions  Echocardiogram An echocardiogram is a procedure that uses painless sound waves (ultrasound) to produce an image of the heart. Images from an echocardiogram can provide important information about:  Signs of coronary artery disease (CAD).  Aneurysm detection. An aneurysm is a weak or damaged part of an artery wall that bulges out from the normal force of blood pumping through the body.  Heart size and shape. Changes in the size or shape of the heart can be associated with certain conditions, including heart failure, aneurysm, and CAD.  Heart muscle function.  Heart valve function.  Signs of a past heart attack.  Fluid buildup around the heart.  Thickening of the heart muscle.  A tumor or infectious growth around the heart valves. Tell a health care provider about:  Any allergies you have.  All medicines you are taking, including vitamins, herbs, eye drops, creams, and over-the-counter medicines.  Any blood disorders you have.  Any surgeries you have had.  Any medical conditions you have.  Whether you are pregnant or may be pregnant. What are the risks? Generally, this is a safe procedure. However, problems may occur, including:  Allergic reaction to dye (contrast) that may be used during the procedure. What happens before the procedure? No specific preparation is needed. You may eat and drink normally. What happens during the procedure?   An IV tube may be inserted into one of  your veins.  You may receive contrast through this tube. A contrast is an injection that improves the quality of the pictures from your heart.  A gel will be applied to your chest.  A wand-like tool (transducer) will be moved over your chest. The gel will help to transmit the sound waves from the transducer.  The sound waves will harmlessly bounce  off of your heart to allow the heart images to be captured in real-time motion. The images will be recorded on a computer. The procedure may vary among health care providers and hospitals. What happens after the procedure?  You may return to your normal, everyday life, including diet, activities, and medicines, unless your health care provider tells you not to do that. Summary  An echocardiogram is a procedure that uses painless sound waves (ultrasound) to produce an image of the heart.  Images from an echocardiogram can provide important information about the size and shape of your heart, heart muscle function, heart valve function, and fluid buildup around your heart.  You do not need to do anything to prepare before this procedure. You may eat and drink normally.  After the echocardiogram is completed, you may return to your normal, everyday life, unless your health care provider tells you not to do that. This information is not intended to replace advice given to you by your health care provider. Make sure you discuss any questions you have with your health care provider. Document Revised: 12/24/2018 Document Reviewed: 10/05/2016 Elsevier Patient Education  [place].

## 2019-12-03 ENCOUNTER — Ambulatory Visit (HOSPITAL_BASED_OUTPATIENT_CLINIC_OR_DEPARTMENT_OTHER): Payer: 59

## 2019-12-07 ENCOUNTER — Other Ambulatory Visit (HOSPITAL_BASED_OUTPATIENT_CLINIC_OR_DEPARTMENT_OTHER): Payer: 59

## 2019-12-10 ENCOUNTER — Ambulatory Visit (HOSPITAL_BASED_OUTPATIENT_CLINIC_OR_DEPARTMENT_OTHER): Payer: 59

## 2020-01-11 ENCOUNTER — Ambulatory Visit (HOSPITAL_BASED_OUTPATIENT_CLINIC_OR_DEPARTMENT_OTHER): Payer: 59

## 2020-01-13 ENCOUNTER — Ambulatory Visit (HOSPITAL_BASED_OUTPATIENT_CLINIC_OR_DEPARTMENT_OTHER): Payer: 59

## 2020-02-16 ENCOUNTER — Telehealth (INDEPENDENT_AMBULATORY_CARE_PROVIDER_SITE_OTHER): Payer: 59 | Admitting: Family Medicine

## 2020-02-16 ENCOUNTER — Encounter: Payer: Self-pay | Admitting: Family Medicine

## 2020-02-16 DIAGNOSIS — J0100 Acute maxillary sinusitis, unspecified: Secondary | ICD-10-CM | POA: Diagnosis not present

## 2020-02-16 MED ORDER — AMOXICILLIN-POT CLAVULANATE 875-125 MG PO TABS: 1.0000 | ORAL_TABLET | Freq: Two times a day (BID) | ORAL | 0 refills | Status: AC

## 2020-02-16 NOTE — Progress Notes (Signed)
Virtual Visit via Video Note  I connected with Ashley Henry on 02/16/20 at  4:30 PM EDT by a video enabled telemedicine application 2/2 [id number] pandemic and verified that I am speaking with the correct person using two identifiers.  Location patient: home Location provider:work or home office Persons participating in the virtual visit: patient, provider  I discussed the limitations of evaluation and management by telemedicine and the availability of in person appointments. The patient expressed understanding and agreed to proceed.   HPI: Pt is a 44 yo female with pmh sig for HTN, pre DM, renal calculi, anemia followed by Dr. Elease Hashimoto seen for acute concern.  Pt with nasal and chest congestion, cough, hoarse voice, mild maxillary facial pain, and HA x 2 wks.  Denies ear pain/pressure, sore throat, diarrhea, constipation, fever, sick contacts.  Tried benadryl, robitusum DM without relief.  ROS: See pertinent positives and negatives per HPI.  Past Medical History:  Diagnosis Date  . Anemia   . HYPERLIPIDEMIA 10/11/2009  . Hypertension 2010   no meds now  . Kidney stones   . Pre-diabetes    HgbA1c 6.2 on 09-30-17  . Renal disorder     Past Surgical History:  Procedure Laterality Date  . BREAST REDUCTION SURGERY  1999  . CESAREAN SECTION  2004  . Cysts removed from chest wall    . EYE SURGERY Left   . MASS EXCISION N/A 04/14/2018   Procedure: EXCISION OF CHEST WALL MASS;  Surgeon: Coralie Keens, MD;  Location: [facility];  Service: General;  Laterality: N/A;    Family History  Problem Relation Age of Onset  . Hypertension Mother   . Diabetes Mother   . Hypertension Father   . Stomach cancer Paternal Grandmother   . Stomach cancer Maternal Uncle   . Heart disease Maternal Uncle   . Lung cancer Maternal Grandfather   . Prostate cancer Maternal Uncle   . Thyroid disease Maternal Uncle      Current Outpatient Medications:  .  BEPREVE 1.5 % SOLN, Place 1  drop into both eyes daily., Disp: , Rfl: 10 .  Norgestimate-Ethinyl Estradiol Triphasic (ORTHO TRI-CYCLEN LO) 0.18/0.215/0.25 MG-25 MCG tab, Take 1 tablet by mouth daily., Disp: 28 tablet, Rfl: 2  EXAM:  VITALS per patient if applicable:  RR between 12-20 bpm.  GENERAL: alert, oriented, appears well and in no acute distress  HEENT: atraumatic, conjunctiva clear, no obvious abnormalities on inspection of external nose and ears  NECK: normal movements of the head and neck  LUNGS: on inspection no signs of respiratory distress, breathing rate appears normal, no obvious gross SOB, gasping or wheezing  CV: no obvious cyanosis  MS: moves all visible extremities without noticeable abnormality  PSYCH/NEURO: pleasant and cooperative, no obvious depression or anxiety, speech and thought processing grossly intact  ASSESSMENT AND PLAN:  Discussed the following assessment and plan:  Subacute maxillary sinusitis  -supportive care -ok to use flonase prn - Plan: amoxicillin-clavulanate (AUGMENTIN) 875-125 MG tablet  F/u prn   I discussed the assessment and treatment plan with the patient. The patient was provided an opportunity to ask questions and all were answered. The patient agreed with the plan and demonstrated an understanding of the instructions.   The patient was advised to call back or seek an in-person evaluation if the symptoms worsen or if the condition fails to improve as anticipated.   Billie Ruddy, MD

## 2020-05-17 ENCOUNTER — Ambulatory Visit: Payer: Self-pay | Admitting: Family Medicine

## 2020-05-19 ENCOUNTER — Encounter: Payer: Self-pay | Admitting: Family Medicine

## 2020-05-19 ENCOUNTER — Ambulatory Visit: Payer: BC Managed Care – PPO | Admitting: Family Medicine

## 2020-05-19 ENCOUNTER — Other Ambulatory Visit: Payer: Self-pay

## 2020-05-19 VITALS — BP 130/88 | HR 100 | Temp 98.4°F | Wt 204.7 lb

## 2020-05-19 DIAGNOSIS — R05 Cough: Secondary | ICD-10-CM

## 2020-05-19 DIAGNOSIS — R911 Solitary pulmonary nodule: Secondary | ICD-10-CM

## 2020-05-19 DIAGNOSIS — R053 Chronic cough: Secondary | ICD-10-CM

## 2020-05-19 MED ORDER — OMEPRAZOLE 40 MG PO CPDR: 40.0000 mg | DELAYED_RELEASE_CAPSULE | Freq: Every day | ORAL | 3 refills | Status: DC

## 2020-05-19 MED ORDER — ALBUTEROL SULFATE (2.5 MG/3ML) 0.083% IN NEBU: 2.5000 mg | INHALATION_SOLUTION | Freq: Four times a day (QID) | RESPIRATORY_TRACT | 5 refills | Status: DC | PRN

## 2020-05-19 MED ORDER — BENZONATATE 100 MG PO CAPS: 100.0000 mg | ORAL_CAPSULE | Freq: Three times a day (TID) | ORAL | 2 refills | Status: DC | PRN

## 2020-05-19 NOTE — Patient Instructions (Signed)
I am setting up repeat CT of chest  Leave off all mint products  Start the Omeprazole 40 mg one daily  Try the Tessalon for cough suppression  I will send in the nebulizer order with albuterol to your pharmacy.

## 2020-05-19 NOTE — Progress Notes (Signed)
Established Patient Office Visit  Subjective:  Patient ID: Ashley Henry, female    DOB: 01-03-1976  Age: 44 y.o. MRN: [id number]  CC:  Chief Complaint  Patient presents with  . Follow-up    on CT of lungs   . Cough    HPI Ashley Henry presents for persistent cough past few months.  She states she just recently got some more insurance and that is why she delayed coming in before now to get checked.  Recent history is that last winter she had some persistent facial and neck swelling.  She ended up with CT soft tissue neck back in February.  This did not show any significant neck masses but she did have evidence for 6 mm nodule right upper lobe.  She has never smoked and is considered low risk overall.  Recommendation was for 6 to 67-month noncontrast CT chest.    Patient now relates 36-month history at least of day and night cough which is severe at times.  Cough is sometimes productive early morning.  She feels she may have some wheezing intermittently.  She has tried hand-held albuterol without much improvement.  No ACE inhibitor use.  Not aware of any postnasal drip.  No obvious GERD symptoms.  Never diagnosed with asthma.  No obvious postnasal drip.  She has lost some weight during the past year but this was intentional.  She has a treadmill and is exercising diligently and has made some very positive lifestyle changes.  No recent hemoptysis.  She has tried Robitussin and Benadryl without relief.  She has been using frequent mentholated cough drops.  No pets or other environmental changes.  She had recent PPD at work a month ago that was negative.  Past Medical History:  Diagnosis Date  . Anemia   . HYPERLIPIDEMIA 10/11/2009  . Hypertension 2010   no meds now  . Kidney stones   . Pre-diabetes    HgbA1c 6.2 on 09-30-17  . Renal disorder     Past Surgical History:  Procedure Laterality Date  . BREAST REDUCTION SURGERY  1999  . CESAREAN SECTION  2004  . Cysts removed from chest  wall    . EYE SURGERY Left   . MASS EXCISION N/A 04/14/2018   Procedure: EXCISION OF CHEST WALL MASS;  Surgeon: Coralie Keens, MD;  Location: [facility];  Service: General;  Laterality: N/A;    Family History  Problem Relation Age of Onset  . Hypertension Mother   . Diabetes Mother   . Hypertension Father   . Stomach cancer Paternal Grandmother   . Stomach cancer Maternal Uncle   . Heart disease Maternal Uncle   . Lung cancer Maternal Grandfather   . Prostate cancer Maternal Uncle   . Thyroid disease Maternal Uncle     Social History   Socioeconomic History  . Marital status: Married    Spouse name: [name]  . Number of children: [name]  . Years of education: [name]  . Highest education level: [name]  Occupational History  . [name]  Tobacco Use  . Smoking status: Never Smoker  . Smokeless tobacco: Never Used  Vaping Use  . Vaping Use: Never used  Substance and Sexual Activity  . Alcohol use: No    Alcohol/week: 0.0 standard drinks  . Drug use: No  . Sexual activity: Yes    Birth control/protection: Pill, Condom    Comment: 1st intercourse 44  yo-Fewer than 5 partners  Other Topics Concern  . [name]  Social History Narrative  . [name]   Social Determinants of Health   Financial Resource Strain:   . Difficulty of Paying Living Expenses: [name]  Food Insecurity:   . Worried About Charity fundraiser in the Last Year: [name]  . Ran Out of Food in the Last Year: [name]  Transportation Needs:   . Lack of Transportation (Medical): [name]  . Lack of Transportation (Non-Medical): [name]  Physical Activity:   . Days of Exercise per Week: [name]  . Minutes of Exercise per Session: [name]  Stress:   . Feeling of Stress : [name]  Social Connections:   . Frequency of Communication with Friends and Family: [name]  . Frequency of Social Gatherings with Friends and Family: Not  on file  . Attends Religious Services: [name]  . Active Member of Clubs or Organizations: [name]  . Attends Archivist Meetings: [name]  . Marital Status: [name]  Intimate Partner Violence:   . Fear of Current or Ex-Partner: [name]  . Emotionally Abused: [name]  . Physically Abused: [name]  . Sexually Abused: [name]    Outpatient Medications Prior to Visit  Medication Sig Dispense Refill  . BEPREVE 1.5 % SOLN Place 1 drop into both eyes daily.  10  . Norgestimate-Ethinyl Estradiol Triphasic (ORTHO TRI-CYCLEN LO) 0.18/0.215/0.25 MG-25 MCG tab Take 1 tablet by mouth daily. 28 tablet 2   No facility-administered medications prior to visit.    Allergies  Allergen Reactions  . Multihance [Gadobenate] Nausea And Vomiting  . Amlodipine Nausea Only  . Collagenase Hives  . Collagenase Clostridium Histolyticum Hives  . Aspirin Rash    ROS Review of Systems  Constitutional: Negative for chills, fever and unexpected weight change.  HENT: Negative for postnasal drip.   Respiratory: Positive for cough and wheezing. Negative for shortness of breath.   Cardiovascular: Negative for chest pain, palpitations and leg swelling.  Gastrointestinal: Negative for abdominal pain.      Objective:    Physical Exam Vitals reviewed.  Constitutional:      Appearance: Normal appearance.  HENT:     Right Ear: Ear canal normal.     Left Ear: Ear canal normal.  Cardiovascular:     Rate and Rhythm: Normal rate and regular rhythm.  Pulmonary:     Effort: Pulmonary effort is normal.     Breath sounds: Normal breath sounds. No wheezing or rales.  Musculoskeletal:     Right lower leg: No edema.     Left lower leg: No edema.  Neurological:     Mental Status: She is alert.     BP 130/88 (BP Location: Left Arm, Patient Position: Sitting, Cuff Size: Large)   Pulse 100   Temp 98.4 F (36.9 C) (Oral)   Wt 204 lb 11.2 oz (92.9 kg)   LMP 05/18/2020  (Exact Date)   SpO2 98%   BMI 35.69 kg/m  Wt Readings from Last 3 Encounters:  05/19/20 204 lb 11.2 oz (92.9 kg)  11/26/19 202 lb (91.6 kg)  10/28/19 210 lb (95.3 kg)     Health Maintenance Due  Topic Date Due  . COVID-19 Vaccine (1) Never done    There are no preventive care reminders to display  for this patient.  Lab Results  Component Value Date   TSH 1.86 09/29/2017   Lab Results  Component Value Date   WBC 7.9 05/31/2019   HGB 10.2 (L) 05/31/2019   HCT 31.1 (L) 05/31/2019   MCV 67.5 (L) 05/31/2019   PLT 345.0 05/31/2019   Lab Results  Component Value Date   NA 137 09/29/2017   K 4.0 09/29/2017   CO2 27 09/29/2017   GLUCOSE 91 09/29/2017   BUN 10 09/29/2017   CREATININE 1.14 (H) 09/29/2017   BILITOT 0.3 09/29/2017   ALKPHOS 80 09/26/2017   AST 16 09/29/2017   ALT 16 09/29/2017   PROT 7.7 09/29/2017   ALBUMIN 3.7 09/26/2017   CALCIUM 9.1 09/29/2017   ANIONGAP 8 12/26/2015   GFR 67.96 09/26/2017   Lab Results  Component Value Date   CHOL 203 (H) 09/29/2017   Lab Results  Component Value Date   HDL 46 (L) 09/29/2017   Lab Results  Component Value Date   LDLCALC 137 (H) 09/29/2017   Lab Results  Component Value Date   TRIG 101 09/29/2017   Lab Results  Component Value Date   CHOLHDL 4.4 09/29/2017   Lab Results  Component Value Date   HGBA1C 6.2 (H) 09/29/2017      Assessment & Plan:   #1 probable incidental nodule 6 mm right upper lobe noted on CT neck 7 months ago in a patient considered low risk  -Set up repeat noncontrast CT chest to further evaluate  #2 chronic cough.  Over 3 months duration.  Nonfocal exam.  Patient is describing some intermittent "wheezing "but none noted on exam.  No obvious GERD symptoms but we explained that silent GERD can frequently be related.  No ACE inhibitor use.  No obvious postnasal drip symptoms.  -Avoid mint products and mentholated cough drops -Consider trial of Tessalon Perles 100 mg every 8  hours as needed for cough -Recent PPD negative -Noncontrast CT as above -Recommend trial of omeprazole 40 mg once daily -Patient requesting albuterol by nebulizer.  We explained that hand-held and delivery through [facility] is probably about just as good but she would like to try nebulizer nevertheless.  If we get this covered we will send then along with albuterol solution  Meds ordered this encounter  Medications  . benzonatate (TESSALON PERLES) 100 MG capsule    Sig: Take 1 capsule (100 mg total) by mouth 3 (three) times daily as needed for cough.    Dispense:  40 capsule    Refill:  2  . omeprazole (PRILOSEC) 40 MG capsule    Sig: Take 1 capsule (40 mg total) by mouth daily.    Dispense:  30 capsule    Refill:  3    Follow-up: Return in about 3 weeks (around 06/09/2020).    Carolann Littler, MD

## 2020-05-25 ENCOUNTER — Telehealth: Payer: Self-pay | Admitting: Family Medicine

## 2020-05-25 ENCOUNTER — Encounter: Payer: Self-pay | Admitting: Family Medicine

## 2020-05-25 NOTE — Telephone Encounter (Signed)
Pt stated she received the referral information for her CT scan from our office and called Pulmonary at [phone] however when she contacted them they stated she was referred to [facility] for a CT scan and to contact us back to confirm. Informed pt that I only see the referral for Pulmonary but will get with the referral coordinator tomorrow to figure out if a new one needs to be sent to GI.   Pt can be reached at [phone]

## 2020-05-26 NOTE — Telephone Encounter (Signed)
LVM-spoke to Neoma Laming and she stated a referral to GI will be sent and for her to call [phone] opt 1 then opt 4 to get scheduled.

## 2020-05-26 NOTE — Addendum Note (Signed)
Addended by: Gwenyth Ober R on: 05/26/2020 04:45 PM   Modules accepted: Orders

## 2020-05-29 ENCOUNTER — Telehealth (INDEPENDENT_AMBULATORY_CARE_PROVIDER_SITE_OTHER): Payer: BC Managed Care – PPO | Admitting: Family Medicine

## 2020-05-29 ENCOUNTER — Ambulatory Visit
Admission: RE | Admit: 2020-05-29 | Discharge: 2020-05-29 | Disposition: A | Discharge status: Home / Self Care | Payer: BC Managed Care – PPO | Source: Ambulatory Visit | Attending: Family Medicine | Admitting: Family Medicine

## 2020-05-29 ENCOUNTER — Telehealth: Payer: Self-pay | Admitting: *Deleted

## 2020-05-29 ENCOUNTER — Encounter: Payer: Self-pay | Admitting: Family Medicine

## 2020-05-29 VITALS — Ht 63.5 in | Wt 204.0 lb

## 2020-05-29 DIAGNOSIS — R05 Cough: Secondary | ICD-10-CM

## 2020-05-29 DIAGNOSIS — R9389 Abnormal findings on diagnostic imaging of other specified body structures: Secondary | ICD-10-CM

## 2020-05-29 DIAGNOSIS — R053 Chronic cough: Secondary | ICD-10-CM

## 2020-05-29 DIAGNOSIS — R911 Solitary pulmonary nodule: Secondary | ICD-10-CM

## 2020-05-29 NOTE — Patient Instructions (Signed)
Health Maintenance Due  Topic Date Due  . COVID-19 Vaccine (1) Never done    Depression screen [facility] 2/9 05/19/2020  Decreased Interest 0  Down, Depressed, Hopeless 0  PHQ - 2 Score 0

## 2020-05-29 NOTE — Progress Notes (Signed)
Patient ID: DEDE DOBESH, female   DOB: [birth year], 44 y.o.   MRN: [id number]  This visit type was conducted due to national recommendations for restrictions regarding the COVID-19 pandemic in an effort to limit this patient's exposure and mitigate transmission in our community.   Virtual Visit via Video Note  I connected with [name] on 05/29/20 at  3:45 PM EDT by a video enabled telemedicine application and verified that I am speaking with the correct person using two identifiers.  Location patient: home Location provider:work or home office Persons participating in the virtual visit: patient, provider  I discussed the limitations of evaluation and management by telemedicine and the availability of in person appointments. The patient expressed understanding and agreed to proceed.   HPI:  Aithana was set up today to go over recent CT lung results.  Refer to her recent office note for details  MELDA MERMELSTEIN presents for persistent cough past few months.  She states she just recently got some more insurance and that is why she delayed coming in before now to get checked.  Recent history is that last winter she had some persistent facial and neck swelling.  She ended up with CT soft tissue neck back in February.  This did not show any significant neck masses but she did have evidence for 6 mm nodule right upper lobe.  She has never smoked and is considered low risk overall.  Recommendation was for 6 to 34-month noncontrast CT chest.    Patient now relates 28-month history at least of day and night cough which is severe at times.  Cough is sometimes productive early morning.  She feels she may have some wheezing intermittently.  She has tried hand-held albuterol without much improvement.  No ACE inhibitor use.  Not aware of any postnasal drip.  No obvious GERD symptoms.  Never diagnosed with asthma.  No obvious postnasal drip.  She has lost some weight during the past year but this was  intentional.  She has a treadmill and is exercising diligently and has made some very positive lifestyle changes.  No recent hemoptysis.  She has tried Robitussin and Benadryl without relief.  She has been using frequent mentholated cough drops.  No pets or other environmental changes.  She had recent PPD at work a month ago that was negative  CT chest showed pulmonary parenchymal and thoracic nodal findings consistent with likely sarcoidosis.  She also had some heterogenous density throughout the liver with differential of steatosis versus possible hepatic involvement of sarcoidosis.  Her cough is essentially unchanged.  Prior to CT of chest we had recommended some omeprazole to cover for possible silent GERD.  She has no fever.  She relates several family members with sarcoidosis including a brother, and aunt, and 3 uncles.  Patient has had no fever.  She has had some intentional weight loss this year with regular exercise and dietary change.    ROS: See pertinent positives and negatives per HPI.  Past Medical History:  Diagnosis Date  . Anemia   . HYPERLIPIDEMIA 10/11/2009  . Hypertension 2010   no meds now  . Kidney stones   . Pre-diabetes    HgbA1c 6.2 on 09-30-17  . Renal disorder     Past Surgical History:  Procedure Laterality Date  . BREAST REDUCTION SURGERY  1999  . CESAREAN SECTION  2004  . Cysts removed from chest wall    . EYE SURGERY Left   . MASS EXCISION N/A  04/14/2018   Procedure: EXCISION OF CHEST WALL MASS;  Surgeon: Coralie Keens, MD;  Location: [facility];  Service: General;  Laterality: N/A;    Family History  Problem Relation Age of Onset  . Hypertension Mother   . Diabetes Mother   . Hypertension Father   . Stomach cancer Paternal Grandmother   . Stomach cancer Maternal Uncle   . Heart disease Maternal Uncle   . Lung cancer Maternal Grandfather   . Prostate cancer Maternal Uncle   . Thyroid disease Maternal Uncle     SOCIAL HX:  non-smoker.    Current Outpatient Medications:  .  albuterol (PROVENTIL) (2.5 MG/3ML) 0.083% nebulizer solution, Take 3 mLs (2.5 mg total) by nebulization every 6 (six) hours as needed for wheezing or shortness of breath., Disp: 75 mL, Rfl: 5 .  benzonatate (TESSALON PERLES) 100 MG capsule, Take 1 capsule (100 mg total) by mouth 3 (three) times daily as needed for cough., Disp: 40 capsule, Rfl: 2 .  BEPREVE 1.5 % SOLN, Place 1 drop into both eyes daily., Disp: , Rfl: 10 .  Norgestimate-Ethinyl Estradiol Triphasic (ORTHO TRI-CYCLEN LO) 0.18/0.215/0.25 MG-25 MCG tab, Take 1 tablet by mouth daily., Disp: 28 tablet, Rfl: 2 .  omeprazole (PRILOSEC) 40 MG capsule, Take 1 capsule (40 mg total) by mouth daily., Disp: 30 capsule, Rfl: 3  EXAM:  VITALS per patient if applicable:  GENERAL: alert, oriented, appears well and in no acute distress  HEENT: atraumatic, conjunttiva clear, no obvious abnormalities on inspection of external nose and ears  NECK: normal movements of the head and neck  LUNGS: on inspection no signs of respiratory distress, breathing rate appears normal, no obvious gross SOB, gasping or wheezing  CV: no obvious cyanosis  MS: moves all visible extremities without noticeable abnormality  PSYCH/NEURO: pleasant and cooperative, no obvious depression or anxiety, speech and thought processing grossly intact  ASSESSMENT AND PLAN:  Discussed the following assessment and plan:  Patient has CT findings above concerning for possible sarcoidosis.  Strong family history of sarcoidosis.  She has had some fairly chronic cough as above and this may very well be related to her pulmonary findings.  She reports that she had PPD test through work that was negative but we need to confirm  -Set up pulmonary referral  -We discussed some of the various manifestations of sarcoidosis and that this is a multisystem disease  -We discussed the nonspecificity of several lab tests and  recommended  further work-up per pulmonary     I discussed the assessment and treatment plan with the patient. The patient was provided an opportunity to ask questions and all were answered. The patient agreed with the plan and demonstrated an understanding of the instructions.   The patient was advised to call back or seek an in-person evaluation if the symptoms worsen or if the condition fails to improve as anticipated.     Carolann Littler, MD

## 2020-05-29 NOTE — Telephone Encounter (Signed)
PCP ordered a nebulizer.  The DME order was printed and faxed to [facility] at [phone], along with demographics info.

## 2020-06-12 ENCOUNTER — Encounter: Payer: Self-pay | Admitting: Family Medicine

## 2020-06-13 MED ORDER — HYDROCODONE-HOMATROPINE 5-1.5 MG/5ML PO SYRP
5.0000 mL | ORAL_SOLUTION | Freq: Four times a day (QID) | ORAL | 0 refills | Status: AC | PRN
Start: 2020-06-13 — End: 2020-06-23

## 2020-06-13 NOTE — Telephone Encounter (Signed)
Find out if she has heard from pulmonary.  I will send in some limited Hycodan cough syrup for short term use at night but with ? Of sarcoid really needs to see pulmonary.

## 2020-06-19 ENCOUNTER — Other Ambulatory Visit: Payer: Self-pay

## 2020-06-19 ENCOUNTER — Ambulatory Visit (INDEPENDENT_AMBULATORY_CARE_PROVIDER_SITE_OTHER): Payer: BC Managed Care – PPO | Admitting: Internal Medicine

## 2020-06-19 ENCOUNTER — Encounter: Payer: Self-pay | Admitting: Internal Medicine

## 2020-06-19 VITALS — BP 138/100 | HR 88 | Temp 94.6°F | Ht 64.0 in | Wt 207.4 lb

## 2020-06-19 DIAGNOSIS — Z87898 Personal history of other specified conditions: Secondary | ICD-10-CM

## 2020-06-19 DIAGNOSIS — R590 Localized enlarged lymph nodes: Secondary | ICD-10-CM

## 2020-06-19 DIAGNOSIS — R0600 Dyspnea, unspecified: Secondary | ICD-10-CM | POA: Diagnosis not present

## 2020-06-19 DIAGNOSIS — R0609 Other forms of dyspnea: Secondary | ICD-10-CM

## 2020-06-19 DIAGNOSIS — R053 Chronic cough: Secondary | ICD-10-CM | POA: Diagnosis not present

## 2020-06-19 DIAGNOSIS — Z832 Family history of diseases of the blood and blood-forming organs and certain disorders involving the immune mechanism: Secondary | ICD-10-CM

## 2020-06-19 DIAGNOSIS — R932 Abnormal findings on diagnostic imaging of liver and biliary tract: Secondary | ICD-10-CM

## 2020-06-19 LAB — CBC WITH DIFFERENTIAL/PLATELET
Basophils Absolute: 0.1 10*3/uL (ref 0.0–0.1)
Basophils Relative: 1.3 % (ref 0.0–3.0)
Eosinophils Absolute: 0.1 10*3/uL (ref 0.0–0.7)
Eosinophils Relative: 2 % (ref 0.0–5.0)
HCT: 28.5 % — ABNORMAL LOW (ref 36.0–46.0)
Hemoglobin: 9.3 g/dL — ABNORMAL LOW (ref 12.0–15.0)
Lymphocytes Relative: 24.1 % (ref 12.0–46.0)
Lymphs Abs: 1.7 10*3/uL (ref 0.7–4.0)
MCHC: 32.6 g/dL (ref 30.0–36.0)
MCV: 65.4 fl — ABNORMAL LOW (ref 78.0–100.0)
Monocytes Absolute: 0.6 10*3/uL (ref 0.1–1.0)
Monocytes Relative: 9.1 % (ref 3.0–12.0)
Neutro Abs: 4.5 10*3/uL (ref 1.4–7.7)
Neutrophils Relative %: 63.5 % (ref 43.0–77.0)
Platelets: 362 10*3/uL (ref 150.0–400.0)
RBC: 4.36 Mil/uL (ref 3.87–5.11)
RDW: 18.8 % — ABNORMAL HIGH (ref 11.5–15.5)
WBC: 7.1 10*3/uL (ref 4.0–10.5)

## 2020-06-19 LAB — BASIC METABOLIC PANEL
BUN: 15 mg/dL (ref 6–23)
CO2: 24 mEq/L (ref 19–32)
Calcium: 9 mg/dL (ref 8.4–10.5)
Chloride: 102 mEq/L (ref 96–112)
Creatinine, Ser: 1.04 mg/dL (ref 0.40–1.20)
GFR: 70.14 mL/min (ref >60.00)
Glucose, Bld: 97 mg/dL (ref 70–99)
Potassium: 3.9 mEq/L (ref 3.5–5.1)
Sodium: 136 mEq/L (ref 135–145)

## 2020-06-19 LAB — HEPATIC FUNCTION PANEL
ALT: 20 U/L (ref 0–35)
AST: 29 U/L (ref 0–37)
Albumin: 3.3 g/dL — ABNORMAL LOW (ref 3.5–5.2)
Alkaline Phosphatase: 116 U/L (ref 39–117)
Bilirubin, Direct: 0.1 mg/dL (ref 0.0–0.3)
Total Bilirubin: 0.3 mg/dL (ref 0.2–1.2)
Total Protein: 8.3 g/dL (ref 6.0–8.3)

## 2020-06-19 LAB — VITAMIN D 25 HYDROXY (VIT D DEFICIENCY, FRACTURES): VITD: 8.27 ng/mL — ABNORMAL LOW (ref 30.00–100.00)

## 2020-06-19 LAB — APTT: aPTT: 28.2 s (ref 23.4–32.7)

## 2020-06-19 LAB — SEDIMENTATION RATE: Sed Rate: >130 mm/hr — ABNORMAL HIGH (ref 0–20)

## 2020-06-19 LAB — PROTIME-INR
INR: 1.1 ratio — ABNORMAL HIGH (ref 0.8–1.0)
Prothrombin Time: 12.7 s (ref 9.6–13.1)

## 2020-06-19 NOTE — Progress Notes (Signed)
OV 06/19/2020  Subjective:  Patient ID: Ashley Henry, female , DOB: [birth year] , age 44 y.o. , MRN: [id number] , ADDRESS: [place] [id number]  PCP Eulas Post, MD Attending : Treatment Team:  Attending Provider: Brand Males, MD    06/19/2020 -   Chief Complaint  Patient presents with  . Consult    Patient is here for a productive/dry cough that she has had for several months with brown/yellow thivk sputum. Shortness of breath with exertion, stong smells, to hot.      HPI Ashley Henry 44 y.o. -referred by primary care physician Burchette, Alinda Sierras, MD.  Patient is a psychotherapist.  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  Her 3 maternal uncles, her brother and her maternal aunt all have sarcoidosis.  Her uncles are deceased although from other reasons.  Her brother and her aunt are still living.  She tells me that in the last 6 months he has had insidious onset of chronic cough, shortness of breath, night sweats and chills.  These have persisted.  This is despite having an intentional 8 pound weight loss during this time.  She says cough is made worse by talking or exertion.  Similarly shortness of breath is made worse by exertion.  Also made worse by talking through the mask during counseling sessions.  Both are relieved by rest.  Cough is also relieved by not talking.  She does have nocturnal awakening with cough chest tightness and wheezing.  She also reports right infra axillary right upper quadrant pain with coughing.  She does feel chest tightness deep inside the chest.  She had CT scan of the chest that I personally visualized.  Shows mediastinal lymphadenopathy consistent with stage I sarcoidosis.  CT scan abdomen cuts on the chest CT show possible evidence of sarcoidosis in the liver.  MRI has been recommended.  There are associated palpitations and night sweats.  Cough is associated with sputum productin     IMPRESSION:    1. Pulmonary parenchymal and thoracic nodal findings which are consistent with sarcoidosis. 2. Heterogeneous density throughout the liver. Possibly heterogeneous steatosis. Hepatic involvement of sarcoidosis could look similar. If there are right upper quadrant symptoms or abnormal liver function test, pre and post contrast abdominal MRI should be considered.   Electronically Signed   By: Abigail Miyamoto M.D.   On: 05/29/2020 10:59    Dr Lorenza Cambridge Reflux Symptom Index (> 13-15 suggestive of LPR cough) 0 -> 5  =  none ->severe problem  Hoarseness of problem with voice 3  Clearing  Of Throat 4  Excess throat mucus or feeling of post nasal drip 4  Difficulty swallowing food, liquid or tablets 0  Cough after eating or lying down 3  Breathing difficulties or choking episodes 3  Troublesome or annoying cough 5  Sensation of something sticking in throat or lump in throat 5  Heartburn, chest pain, indigestion, or stomach acid coming up 0  TOTAL 27   3 4  ROS - per HPI  Results for CINDRA, AUSTAD (MRN [id number]) as of 06/19/2020 09:03  Ref. Range 09/26/2017 14:27 10/13/2017 16:54 10/06/2018 14:47  Anti Nuclear Antibody (ANA) Latest Ref Range: NEGATIVE   NEGATIVE   SSA (Ro) (ENA) Antibody, IgG Latest Ref Range: <1.0 NEG AI <1.0 NEG    SSB (La) (ENA) Antibody, IgG Latest Ref Range: <1.0 NEG AI <1.0 NEG  has a past medical history of Anemia, HYPERLIPIDEMIA (10/11/2009), Hypertension (2010), Kidney stones, Pre-diabetes, and Renal disorder.   reports that she has never smoked. She has never used smokeless tobacco.  Past Surgical History:  Procedure Laterality Date  . BREAST REDUCTION SURGERY  1999  . CESAREAN SECTION  2004  . Cysts removed from chest wall    . EYE SURGERY Left   . MASS EXCISION N/A 04/14/2018   Procedure: EXCISION OF CHEST WALL MASS;  Surgeon: Coralie Keens, MD;  Location: [place];  Service: General;  Laterality: N/A;    Allergies   Allergen Reactions  . Multihance [Gadobenate] Nausea And Vomiting  . Amlodipine Nausea Only  . Collagenase Hives  . Collagenase Clostridium Histolyticum Hives  . Aspirin Rash    Immunization History  Administered Date(s) Administered  . PPD Test 03/29/2020  . Td 09/04/2009    Family History  Problem Relation Age of Onset  . Hypertension Mother   . Diabetes Mother   . Hypertension Father   . Stomach cancer Paternal Grandmother   . Stomach cancer Maternal Uncle   . Heart disease Maternal Uncle   . Lung cancer Maternal Grandfather   . Prostate cancer Maternal Uncle   . Thyroid disease Maternal Uncle      Current Outpatient Medications:  .  albuterol (PROVENTIL) (2.5 MG/3ML) 0.083% nebulizer solution, Take 3 mLs (2.5 mg total) by nebulization every 6 (six) hours as needed for wheezing or shortness of breath., Disp: 75 mL, Rfl: 5 .  benzonatate (TESSALON PERLES) 100 MG capsule, Take 1 capsule (100 mg total) by mouth 3 (three) times daily as needed for cough., Disp: 40 capsule, Rfl: 2 .  BEPREVE 1.5 % SOLN, Place 1 drop into both eyes daily., Disp: , Rfl: 10 .  HYDROcodone-homatropine (HYCODAN) 5-1.5 MG/5ML syrup, Take 5 mLs by mouth every 6 (six) hours as needed for up to 10 days., Disp: 120 mL, Rfl: 0 .  Norgestimate-Ethinyl Estradiol Triphasic (ORTHO TRI-CYCLEN LO) 0.18/0.215/0.25 MG-25 MCG tab, Take 1 tablet by mouth daily., Disp: 28 tablet, Rfl: 2 .  omeprazole (PRILOSEC) 40 MG capsule, Take 1 capsule (40 mg total) by mouth daily., Disp: 30 capsule, Rfl: 3      Objective:   Vitals:   06/19/20 0912  BP: (!) 138/100  Pulse: 88  Temp: (!) 94.6 F (34.8 C)  TempSrc: Temporal  SpO2: 100%  Weight: 207 lb 6.4 oz (94.1 kg)  Height: 5' 4"  (1.626 m)    Estimated body mass index is 35.6 kg/m as calculated from the following:   Height as of this encounter: 5' 4"  (1.626 m).   Weight as of this encounter: 207 lb 6.4 oz (94.1 kg).  @WEIGHTCHANGE @  [employer]    06/19/20 0912  Weight: 207 lb 6.4 oz (94.1 kg)     Physical Exam  General Appearance:    Alert, cooperative, no distress, appears stated age - yes , Deconditioned looking - no , OBESE  - no, Sitting on Wheelchair -  no  Head:    Normocephalic, without obvious abnormality, atraumatic  Eyes:    PERRL, conjunctiva/corneas clear,  Ears:    Normal TM's and external ear canals, both ears  Nose:   Nares normal, septum midline, mucosa normal, no drainage    or sinus tenderness. OXYGEN ON  - no . Patient is @ ra   Throat:   Lips, mucosa, and tongue normal; teeth and gums normal. Cyanosis on lips - no  Neck:  Supple, symmetrical, trachea midline, no adenopathy;    thyroid:  no enlargement/tenderness/nodules; no carotid   bruit or JVD  Back:     Symmetric, no curvature, ROM normal, no CVA tenderness  Lungs:     Distress - no , Wheeze no, Barrell Chest - no, Purse lip breathing - no, Crackles - no   Chest Wall:    No tenderness or deformity.    Heart:    Regular rate and rhythm, S1 and S2 normal, no rub   or gallop, Murmur - no  Breast Exam:    NOT DONE  Abdomen:     Soft, non-tender, bowel sounds active all four quadrants,    no masses, no organomegaly. Visceral obesity - no  Genitalia:   NOT DONE  Rectal:   NOT DONE  Extremities:   Extremities - normal, Has Cane - no, Clubbing - no, Edema - no  Pulses:   2+ and symmetric all extremities  Skin:   Stigmata of Connective Tissue Disease - no  Lymph nodes:   Cervical, supraclavicular, and axillary nodes normal  Psychiatric:  Neurologic:   Pleasant - yes, Anxious - no, Flat affect - no  CAm-ICU - neg, Alert and Oriented x 3 - yes, Moves all 4s - yes, Speech - normal, Cognition - intact           Assessment:       ICD-10-CM   1. Chronic cough  R05.3   2. Dyspnea on exertion  R06.00   3. Mediastinal adenopathy  R59.0   4. History of wheezing  Z87.898   5. Family history of sarcoidosis  Z83.2   6. History of palpitations  Z87.898   7.  History of night sweats  Z87.898   8. Abnormal liver CT  R93.2        Plan:     Patient Instructions     ICD-10-CM   1. Chronic cough  R05.3   2. Dyspnea on exertion  R06.00   3. Mediastinal adenopathy  R59.0   4. History of wheezing  Z87.898   5. Family history of sarcoidosis  Z83.2   6. History of palpitations  Z87.898   7. History of night sweats  Z87.898   8. Abnormal liver CT  R93.2     Index of suspicion is very high for sarcoidosis Concern for sarocoid in liver  Other possibility is asthma  Plan - Do PFT  And Do FENO next few weeks  - Do blood Vitamin D level  -  Do blood ACE, ESR, Quant gold, DS DNA, RF, CCP, SCL-70, SSA/SSB ,   - Do CBC with Diff, IgE, blood allergy panel, PT, PTT  - Do BMET and LFT   - do MRI Abdomen pre and post contrast -   Followup  - next < 1-2 weeks with APP or Dr Chase Caller to discuss results - might need biopsy   - ok to schedule followup without PFT results     SIGNATURE    Dr. Brand Males, M.D., F.C.C.P,  Pulmonary and Critical Care Medicine Staff Physician, [place] Director - Interstitial Lung Disease  Program  Pulmonary [place] [facility] [place], [place], [address]  Pager: [phone], If no answer or between  15:00h - 7:00h: call [phone] Telephone: [phone]  9:32 AM 06/19/2020

## 2020-06-19 NOTE — Addendum Note (Signed)
Addended by: Suzzanne Cloud E on: 06/19/2020 12:15 PM   Modules accepted: Orders

## 2020-06-19 NOTE — Patient Instructions (Addendum)
ICD-10-CM   1. Chronic cough  R05.3   2. Dyspnea on exertion  R06.00   3. Mediastinal adenopathy  R59.0   4. History of wheezing  Z87.898   5. Family history of sarcoidosis  Z83.2   6. History of palpitations  Z87.898   7. History of night sweats  Z87.898   8. Abnormal liver CT  R93.2     Index of suspicion is very high for sarcoidosis Concern for sarocoid in liver  Other possibility is asthma  Plan - Do PFT  And Do FENO next few weeks  - Do blood Vitamin D level  -  Do blood ACE, ESR, Quant gold, DS DNA, RF, CCP, SCL-70, SSA/SSB ,   - Do CBC with Diff, IgE, blood allergy panel, PT, PTT  - Do BMET and LFT   - Do ECHO  - do MRI Abdomen pre and post contrast -     Followup  - next < 1-2 weeks with APP or Dr Chase Caller to discuss results - might need biopsy   - ok to schedule followup without PFT results

## 2020-06-19 NOTE — Addendum Note (Signed)
Addended by: Suzzanne Cloud E on: 06/19/2020 12:17 PM   Modules accepted: Orders

## 2020-06-20 LAB — RHEUMATOID FACTOR: Rheumatoid fact SerPl-aCnc: 25 IU/mL — ABNORMAL HIGH (ref <14)

## 2020-06-21 LAB — ANGIOTENSIN CONVERTING ENZYME: Angiotensin-Converting Enzyme: 82 U/L — ABNORMAL HIGH (ref 9–67)

## 2020-06-21 LAB — SJOGRENS SYNDROME-A EXTRACTABLE NUCLEAR ANTIBODY: SSA (Ro) (ENA) Antibody, IgG: <1 AI

## 2020-06-21 LAB — ANTI-DNA ANTIBODY, DOUBLE-STRANDED: ds DNA Ab: <1 IU/mL

## 2020-06-21 LAB — RHEUMATOID FACTOR: Rheumatoid fact SerPl-aCnc: 24 IU/mL — ABNORMAL HIGH (ref <14)

## 2020-06-21 LAB — QUANTIFERON-TB GOLD PLUS
Mitogen-NIL: >10 IU/mL
NIL: 0.16 IU/mL
QuantiFERON-TB Gold Plus: NEGATIVE
TB1-NIL: 0.03 IU/mL
TB2-NIL: 0.05 IU/mL

## 2020-06-21 LAB — SJOGRENS SYNDROME-B EXTRACTABLE NUCLEAR ANTIBODY: SSB (La) (ENA) Antibody, IgG: <1 AI

## 2020-06-21 LAB — CYCLIC CITRUL PEPTIDE ANTIBODY, IGG: Cyclic Citrullin Peptide Ab: <16 UNITS

## 2020-06-21 LAB — ANTI-SCLERODERMA ANTIBODY: Scleroderma (Scl-70) (ENA) Antibody, IgG: <1 AI

## 2020-06-23 ENCOUNTER — Telehealth: Payer: Self-pay | Admitting: Internal Medicine

## 2020-06-23 LAB — ALLERGEN PANEL (27) + IGE
Alternaria Alternata IgE: <0.1 kU/L
Aspergillus Fumigatus IgE: <0.1 kU/L
Bahia Grass IgE: <0.1 kU/L
Bermuda Grass IgE: <0.1 kU/L
Cat Dander IgE: <0.1 kU/L
Cedar, Mountain IgE: <0.1 kU/L
Cladosporium Herbarum IgE: <0.1 kU/L
Cocklebur IgE: <0.1 kU/L
Cockroach, American IgE: <0.1 kU/L
Common Silver Birch IgE: <0.1 kU/L
D Farinae IgE: <0.1 kU/L
D Pteronyssinus IgE: <0.1 kU/L
Dog Dander IgE: <0.1 kU/L
Elm, American IgE: <0.1 kU/L
Hickory, White IgE: <0.1 kU/L
IgE (Immunoglobulin E), Serum: 67 IU/mL (ref 6–495)
Johnson Grass IgE: <0.1 kU/L
Kentucky Bluegrass IgE: <0.1 kU/L
Maple/Box Elder IgE: <0.1 kU/L
Mucor Racemosus IgE: <0.1 kU/L
Oak, White IgE: <0.1 kU/L
Penicillium Chrysogen IgE: <0.1 kU/L
Pigweed, Rough IgE: <0.1 kU/L
Plantain, English IgE: <0.1 kU/L
Ragweed, Short IgE: <0.1 kU/L
Setomelanomma Rostrat: <0.1 kU/L
Timothy Grass IgE: <0.1 kU/L
White Mulberry IgE: <0.1 kU/L

## 2020-06-23 NOTE — Telephone Encounter (Signed)
Let Ashley Henry that blood work  Normal except for  1. Vit D very low    2. Chronic anemia - hgb 9s and stable  3. ACE marker for sarcoid - is high  Plan  - Vit D - can discuss at followup for Rx  - anemia - talk to PCP Burchette, Alinda Sierras, MD  - contiue workup and we can discuss at folowup

## 2020-06-26 NOTE — Telephone Encounter (Signed)
Called and spoke with pt letting her know results of labwork and recommendations per MR. Pt verbalized understanding. Nothing further needed.

## 2020-06-26 NOTE — Telephone Encounter (Signed)
Attempted to call pt but unable to reach. Left message for her to return call. 

## 2020-06-26 NOTE — Telephone Encounter (Signed)
Pt returning ap hone call about labwork. pt can be reached at [phone]. Okay to leave a detailed message with results if pt does not answer.

## 2020-06-29 ENCOUNTER — Other Ambulatory Visit (HOSPITAL_COMMUNITY): Payer: BC Managed Care – PPO

## 2020-07-06 ENCOUNTER — Other Ambulatory Visit: Payer: Self-pay | Admitting: Family Medicine

## 2020-07-07 ENCOUNTER — Other Ambulatory Visit: Payer: Self-pay

## 2020-07-07 ENCOUNTER — Ambulatory Visit: Payer: BC Managed Care – PPO | Admitting: Adult Health

## 2020-07-07 ENCOUNTER — Ambulatory Visit (HOSPITAL_COMMUNITY)
Admission: RE | Admit: 2020-07-07 | Discharge: 2020-07-07 | Disposition: A | Discharge status: Home / Self Care | Payer: BC Managed Care – PPO | Source: Ambulatory Visit | Attending: Internal Medicine | Admitting: Internal Medicine

## 2020-07-07 DIAGNOSIS — R590 Localized enlarged lymph nodes: Secondary | ICD-10-CM | POA: Diagnosis not present

## 2020-07-07 DIAGNOSIS — R053 Chronic cough: Secondary | ICD-10-CM

## 2020-07-07 DIAGNOSIS — E785 Hyperlipidemia, unspecified: Secondary | ICD-10-CM | POA: Diagnosis not present

## 2020-07-07 DIAGNOSIS — Z832 Family history of diseases of the blood and blood-forming organs and certain disorders involving the immune mechanism: Secondary | ICD-10-CM | POA: Diagnosis not present

## 2020-07-07 DIAGNOSIS — R0600 Dyspnea, unspecified: Secondary | ICD-10-CM

## 2020-07-07 DIAGNOSIS — R0609 Other forms of dyspnea: Secondary | ICD-10-CM

## 2020-07-07 LAB — ECHOCARDIOGRAM COMPLETE
Area-P 1/2: 3.6 cm2
S' Lateral: 2.9 cm

## 2020-07-07 NOTE — Progress Notes (Signed)
Echocardiogram 2D Echocardiogram has been performed.  Oneal Deputy Demaris Leavell 07/07/2020, 9:37 AM

## 2020-07-08 ENCOUNTER — Ambulatory Visit
Admission: RE | Admit: 2020-07-08 | Discharge: 2020-07-08 | Disposition: A | Discharge status: Home / Self Care | Payer: BC Managed Care – PPO | Source: Ambulatory Visit | Attending: Internal Medicine | Admitting: Internal Medicine

## 2020-07-08 DIAGNOSIS — R053 Chronic cough: Secondary | ICD-10-CM

## 2020-07-08 DIAGNOSIS — R0600 Dyspnea, unspecified: Secondary | ICD-10-CM

## 2020-07-08 DIAGNOSIS — R0609 Other forms of dyspnea: Secondary | ICD-10-CM

## 2020-07-08 DIAGNOSIS — R590 Localized enlarged lymph nodes: Secondary | ICD-10-CM

## 2020-07-08 DIAGNOSIS — Z832 Family history of diseases of the blood and blood-forming organs and certain disorders involving the immune mechanism: Secondary | ICD-10-CM

## 2020-07-08 DIAGNOSIS — R932 Abnormal findings on diagnostic imaging of liver and biliary tract: Secondary | ICD-10-CM

## 2020-07-08 MED ORDER — GADOBENATE DIMEGLUMINE 529 MG/ML IV SOLN
20.0000 mL | Freq: Once | INTRAVENOUS | Status: AC | PRN
  Administered 2020-07-08: 20 mL via INTRAVENOUS

## 2020-07-11 ENCOUNTER — Telehealth: Payer: Self-pay | Admitting: Internal Medicine

## 2020-07-11 NOTE — Progress Notes (Signed)
Overall Normal echo. Pls discuss results with NP during visti in 2 days

## 2020-07-11 NOTE — Telephone Encounter (Signed)
Tammy: You are seeing Delila Spence on 07/13/20. MRI 10/23 is abnormal for abdomen. She will need GI referral. Given MRI abd abnromality, there is question if she should have an EBUS/bronch of the lungs even if pre-test prob for sarcoid is high (ACE high, ethnicity, CT and abd finidngs) -> I think is probably a good idea to get her refrred to [facility] for this   MR ABDOMEN MRCP W WO CONTAST  Result Date: 07/09/2020 CLINICAL DATA:  Steatohepatitis, sarcoidosis in a 44 year old female EXAM: MRI ABDOMEN WITHOUT AND WITH CONTRAST (INCLUDING MRCP) TECHNIQUE: Multiplanar multisequence MR imaging of the abdomen was performed both before and after the administration of intravenous contrast. Heavily T2-weighted images of the biliary and pancreatic ducts were obtained, and three-dimensional MRCP images were rendered by post processing. CONTRAST:  50mL MULTIHANCE GADOBENATE DIMEGLUMINE 529 MG/ML IV SOLN COMPARISON:  Chest CT from 05/29/2020 and prior abdominal CTs dating back to 2017 FINDINGS: Lower chest: Signs of peribronchovascular nodularity not as well demonstrated as on the recent chest CT. No effusion. Hepatobiliary: Liver span is enlarged measuring approximately 20 cm craniocaudal span. Heterogeneous appearance of the liver on T2 weighted imaging. Lace-like pattern of T2 signal throughout much of the liver. Scattered foci of subtle T2 hypointensity not as discrete as in the spleen. Note that the patient became ill following contrast administration so only an arterial phase and a 5 minutes post-contrast phase are provided. On arterial phase there is a hyperenhancing focus with peripheral low signal measuring 2.9 x 2.1 cm (image 74, series 19) this is not seen as a discrete abnormality on other sequences. Near isointense in this area to surrounding liver on the 5 minutes delay. Areas of hypoenhancement elsewhere in the liver with numerous small areas of nodularity. No additional arterial enhancing, focal  lesion. No biliary duct dilation though MRCP sequences are mildly limited by respiratory motion. Pancreas: Pancreas is normal without ductal dilation or sign of inflammation. Spleen: Spleen slightly larger than on the previous study measuring nearly 10 cm craniocaudal span as compared to 7 cm on the study of 2017 from April of 2017. Largest area in the spleen measuring approximately 2.9 cm (image 39, series 19) with lesions throughout the spleen ranging from this size to less than a cm, all areas of the spleen are involved. The lesions display baseline T2 hypointensity. Adrenals/Urinary Tract:  Adrenal glands are normal. Symmetric renal enhancement. Subtle area of hypointensity in the LEFT kidney measuring 1.5 x 1.5 cm (image 70 of series 21). Area displays non masslike characteristics Stomach/Bowel: Peripheral enhancement of the distal esophagus. Mildly dilated adjacent venous structures in the lesser sac, hepatic gastric ligament no acute gastrointestinal process. Bowel incompletely imaged. Vascular/Lymphatic: Vascular structures in the abdomen are patent with upper abdominal and retroperitoneal lymph nodes which are at the upper limits of normal, nonspecific in the setting of liver disease and with presumed diagnosis of sarcoidosis. Other:  No ascites. Musculoskeletal: No suspicious bone lesions identified. IMPRESSION: 1. Hyperenhancing area in the medial segment of the LEFT hepatic lobe at the boundary between hepatic subsegment IV B and V. Seen on a background of nodular hepatic contours and hepatic heterogeneity, atypical for sarcoid associated lesion given arterial enhancement. Other areas more typical of sarcoid but remain nonspecific. Biopsy may be helpful as warranted of both adjacent liver and lesion if this can be visualized on ultrasound. Overall pattern of liver disease could be seen in the setting of sarcoidosis overall, concomitant causes for liver disease are however frequently seen  in the setting  of sarcoidosis. 2. Splenic lesions could also be related to sarcoidosis and have a more typical appearance. Consider three-month follow-up for above findings and liver biopsy as discussed above. 3. Potential portal hypertension with varices versus distal esophageal hyperenhancement. Endoscopic assessment may be warranted given the presence of liver disease. 4. No signs of steatosis. Electronically Signed   By: Zetta Bills M.D.   On: 07/09/2020 10:48

## 2020-07-11 NOTE — Progress Notes (Signed)
MRI is abnormal. Results will be d/w patient by Tammy Parrett during visit in 2 days. Will NOT call with results  Thanks  MR   MR ABDOMEN MRCP W WO CONTAST  Result Date: 07/09/2020 CLINICAL DATA:  Steatohepatitis, sarcoidosis in a 44 year old female EXAM: MRI ABDOMEN WITHOUT AND WITH CONTRAST (INCLUDING MRCP) TECHNIQUE: Multiplanar multisequence MR imaging of the abdomen was performed both before and after the administration of intravenous contrast. Heavily T2-weighted images of the biliary and pancreatic ducts were obtained, and three-dimensional MRCP images were rendered by post processing. CONTRAST:  58mL MULTIHANCE GADOBENATE DIMEGLUMINE 529 MG/ML IV SOLN COMPARISON:  Chest CT from 05/29/2020 and prior abdominal CTs dating back to 2017 FINDINGS: Lower chest: Signs of peribronchovascular nodularity not as well demonstrated as on the recent chest CT. No effusion. Hepatobiliary: Liver span is enlarged measuring approximately 20 cm craniocaudal span. Heterogeneous appearance of the liver on T2 weighted imaging. Lace-like pattern of T2 signal throughout much of the liver. Scattered foci of subtle T2 hypointensity not as discrete as in the spleen. Note that the patient became ill following contrast administration so only an arterial phase and a 5 minutes post-contrast phase are provided. On arterial phase there is a hyperenhancing focus with peripheral low signal measuring 2.9 x 2.1 cm (image 74, series 19) this is not seen as a discrete abnormality on other sequences. Near isointense in this area to surrounding liver on the 5 minutes delay. Areas of hypoenhancement elsewhere in the liver with numerous small areas of nodularity. No additional arterial enhancing, focal lesion. No biliary duct dilation though MRCP sequences are mildly limited by respiratory motion. Pancreas: Pancreas is normal without ductal dilation or sign of inflammation. Spleen: Spleen slightly larger than on the previous study measuring  nearly 10 cm craniocaudal span as compared to 7 cm on the study of 2017 from April of 2017. Largest area in the spleen measuring approximately 2.9 cm (image 39, series 19) with lesions throughout the spleen ranging from this size to less than a cm, all areas of the spleen are involved. The lesions display baseline T2 hypointensity. Adrenals/Urinary Tract:  Adrenal glands are normal. Symmetric renal enhancement. Subtle area of hypointensity in the LEFT kidney measuring 1.5 x 1.5 cm (image 70 of series 21). Area displays non masslike characteristics Stomach/Bowel: Peripheral enhancement of the distal esophagus. Mildly dilated adjacent venous structures in the lesser sac, hepatic gastric ligament no acute gastrointestinal process. Bowel incompletely imaged. Vascular/Lymphatic: Vascular structures in the abdomen are patent with upper abdominal and retroperitoneal lymph nodes which are at the upper limits of normal, nonspecific in the setting of liver disease and with presumed diagnosis of sarcoidosis. Other:  No ascites. Musculoskeletal: No suspicious bone lesions identified. IMPRESSION: 1. Hyperenhancing area in the medial segment of the LEFT hepatic lobe at the boundary between hepatic subsegment IV B and V. Seen on a background of nodular hepatic contours and hepatic heterogeneity, atypical for sarcoid associated lesion given arterial enhancement. Other areas more typical of sarcoid but remain nonspecific. Biopsy may be helpful as warranted of both adjacent liver and lesion if this can be visualized on ultrasound. Overall pattern of liver disease could be seen in the setting of sarcoidosis overall, concomitant causes for liver disease are however frequently seen in the setting of sarcoidosis. 2. Splenic lesions could also be related to sarcoidosis and have a more typical appearance. Consider three-month follow-up for above findings and liver biopsy as discussed above. 3. Potential portal hypertension with varices  versus distal esophageal  hyperenhancement. Endoscopic assessment may be warranted given the presence of liver disease. 4. No signs of steatosis. Electronically Signed   By: Zetta Bills M.D.   On: 07/09/2020 10:48

## 2020-07-13 ENCOUNTER — Telehealth: Payer: Self-pay | Admitting: Adult Health

## 2020-07-13 ENCOUNTER — Encounter: Payer: Self-pay | Admitting: Adult Health

## 2020-07-13 ENCOUNTER — Ambulatory Visit (INDEPENDENT_AMBULATORY_CARE_PROVIDER_SITE_OTHER): Payer: BC Managed Care – PPO | Admitting: Adult Health

## 2020-07-13 ENCOUNTER — Other Ambulatory Visit: Payer: Self-pay

## 2020-07-13 VITALS — BP 120/80 | HR 77 | Temp 97.6°F | Ht 64.0 in | Wt 207.0 lb

## 2020-07-13 DIAGNOSIS — K766 Portal hypertension: Secondary | ICD-10-CM

## 2020-07-13 DIAGNOSIS — R918 Other nonspecific abnormal finding of lung field: Secondary | ICD-10-CM

## 2020-07-13 DIAGNOSIS — D649 Anemia, unspecified: Secondary | ICD-10-CM

## 2020-07-13 DIAGNOSIS — E559 Vitamin D deficiency, unspecified: Secondary | ICD-10-CM

## 2020-07-13 DIAGNOSIS — R932 Abnormal findings on diagnostic imaging of liver and biliary tract: Secondary | ICD-10-CM

## 2020-07-13 DIAGNOSIS — D869 Sarcoidosis, unspecified: Secondary | ICD-10-CM

## 2020-07-13 DIAGNOSIS — I8500 Esophageal varices without bleeding: Secondary | ICD-10-CM

## 2020-07-13 HISTORY — DX: Abnormal findings on diagnostic imaging of liver and biliary tract: R93.2

## 2020-07-13 HISTORY — DX: Vitamin D deficiency, unspecified: E55.9

## 2020-07-13 HISTORY — DX: Other nonspecific abnormal finding of lung field: R91.8

## 2020-07-13 MED ORDER — PREDNISONE 10 MG PO TABS: ORAL_TABLET | ORAL | 1 refills | Status: DC

## 2020-07-13 NOTE — Telephone Encounter (Signed)
Called and spoke with patient regarding f/u appointment with either [facility] or [facility].  Patient was not available any of the times they were in the office.  She is requesting a call back for the week of the 29th of November.  She verbalized understanding reason for appointment.

## 2020-07-13 NOTE — Assessment & Plan Note (Signed)
Chronic anemia with possible iron deficiency with previous low iron levels. Patient will need further work-up for this. Have suggested she begin iron supplements. Follow-up with primary care.  She is also been referred to GI.

## 2020-07-13 NOTE — Progress Notes (Signed)
_0  ID: Ashley Henry, female    DOB: March 27, 1976, 44 y.o.   MRN: [id number]  Chief Complaint  Patient presents with  . Follow-up    Cough     Referring provider: Eulas Post, MD  HPI: 44 year old female never smoker seen for pulmonary consult June 19, 2020 for 45-monthhistory of chronic cough shortness of breath night sweats and abnormal CT chest suspicious for sarcoidosis.  Patient has a very strong family history of sarcoidosis.  TEST/EVENTS :  Chest x-ray September 29, 2017 clear lungs no acute process  Renal CT April 2017-no focal abnormalities noted in the liver, spleen or pancreas, nonobstructive left nephrolithiasis, moderate right hydroureter nephrosis  CT chest without contrast May 29, 2020 showed perilymphatic, peribronchovascular and subpleural distribution of pulmonary nodularity throughout right apical nodule 5 mm and nodularity along the left major fissure measuring 9 mm.  Heterogeneous density throughout the liver.,  Possible mild bilateral hilar adenopathy  MR abdomen July 08, 2020 hyperenhancing area in the medial segment of the left hepatic lobe, background of nodular hepatic contours and hepatic heterogeneity atypical for sarcoid associated lesion given arterial enhancement other areas more typical for sarcoid but remain nonspecific.,  Splenic lesions noted.  Potential portal hypertension with varices versus distal esophageal hyperenhancement, no signs of steatosis  Labs June 19, 2020: QuantiFERON gold negative, Sjogren's negative, LFTs normal, rheumatoid factor positive, CCP negative, INR 1.1, allergy profile negative, IgE 67, Anti-DNA antibody negative, hepatic panel normal, platelets normal, WBC 7.1 ACE level elevated 82, sed rate greater than 130, vitamin D 8, hemoglobin 9.3 hematocrit 28  2D echo July 07, 2020 EF 55 to 60%, mild LVH, pulmonary artery pressure normal  Previous iron levels were noted low  HIV 2019 neg.   07/13/2020  Follow up : Cough/? Sarcoid /Abnormal CT chest and MRI AB  Patient presents for a 3-week follow-up.  Patient was seen last visit for pulmonary consult for a 646-monthistory of cough shortness of breath night sweats and chills.  She was seen by her primary care provider.  Subsequent CT chest May 29, 2020 showed pulmonary nodularity, possible bilateral bilateral hilar adenopathy, liver abnormalities.  This was suspicious for possible sarcoidosis.  Lab work last visit showed an elevated ACE level at 82 and a high sed rate at greater than 130.  Her rheumatoid factor was positive but CCP was negative.  INR was slightly elevated at 1.1.  Patient anemia was stable with hemoglobin at 9.3.  Her platelets were normal.  Hepatic function testing was normal.  Patient was set up for an MRI abdomen that showed abnormalities with a hyperenhancing area in the medial segment of the left hepatic lobe hepatic nodularity.  And splenic lesions.  There was suspicion for portal hypertension with possible varices versus a distal esophageal hyperenhancement.  There was no signs of steatosis. Patient has significant fatigue low energy decreased activity tolerance cough joint pains.  She has had intermittent rashes but has none currently.  She has had a recent eye exam with no reported abnormalities. We discussed all of her test and lab results. Has very strong family history of sarcoidosis with 3 maternal uncles, her brother and a maternal aunt that all have sarcoidosis. Patient has had intentional weight loss says that she has been on a diet and is down greater than 89 pounds. She denies any hemoptysis bloody stools hematemesis, bruising.  We had a long discussion regarding her abnormalities in her lab work and recent MRI of her liver  and spleen.  We discussed the need to proceed with probable bronchoscopy with biopsy and referral to GI for consideration for further work-up of liver involvement and abnormalities. Unfortunately  patient's medical insurance is going to stop July 16, 2020.  She declines flu shot and Covid vaccines  Patient works independently as a Management consultant.  Allergies  Allergen Reactions  . Multihance [Gadobenate] Nausea And Vomiting  . Amlodipine Nausea Only  . Collagenase Hives  . Collagenase Clostridium Histolyticum Hives  . Aspirin Rash    Immunization History  Administered Date(s) Administered  . PPD Test 03/29/2020  . Td 09/04/2009    Past Medical History:  Diagnosis Date  . Anemia   . HYPERLIPIDEMIA 10/11/2009  . Hypertension 2010   no meds now  . Kidney stones   . Pre-diabetes    HgbA1c 6.2 on 09-30-17  . Renal disorder     Tobacco History: Social History   Tobacco Use  Smoking Status Never Smoker  Smokeless Tobacco Never Used   Counseling given: Not Answered   Outpatient Medications Prior to Visit  Medication Sig Dispense Refill  . albuterol (PROVENTIL) (2.5 MG/3ML) 0.083% nebulizer solution Take 3 mLs (2.5 mg total) by nebulization every 6 (six) hours as needed for wheezing or shortness of breath. 75 mL 5  . benzonatate (TESSALON) 100 MG capsule TAKE 1 CAPSULE(100 MG) BY MOUTH THREE TIMES DAILY AS NEEDED FOR COUGH 40 capsule 2  . BEPREVE 1.5 % SOLN Place 1 drop into both eyes daily.  10  . Norgestimate-Ethinyl Estradiol Triphasic (ORTHO TRI-CYCLEN LO) 0.18/0.215/0.25 MG-25 MCG tab Take 1 tablet by mouth daily. 28 tablet 2  . omeprazole (PRILOSEC) 40 MG capsule Take 1 capsule (40 mg total) by mouth daily. 30 capsule 3   No facility-administered medications prior to visit.     Review of Systems:   Constitutional:   No  weight loss,  ,  Fevers,   +fatigue, or  lassitude.  Positive night sweats positive chills  HEENT:   No headaches,  Difficulty swallowing,  Tooth/dental problems, or  Sore throat,                No sneezing, itching, ear ache, nasal congestion, post nasal drip,   CV:  No chest pain,  Orthopnea, PND, swelling in lower extremities,  anasarca, dizziness, palpitations, syncope.   GI  No heartburn, indigestion, abdominal pain, nausea, vomiting, diarrhea, change in bowel habits, loss of appetite, bloody stools.   Resp: Positive cough, positive shortness of breath no chest wall deformity  Skin: no rash or lesions.  GU: no dysuria, change in color of urine, no urgency or frequency.  No flank pain, no hematuria   MS: Positive joint pains    Physical Exam  BP 120/80 (BP Location: Left Arm, Patient Position: Sitting, Cuff Size: Normal)   Pulse 77   Temp 97.6 F (36.4 C) (Temporal)   Ht _0  (1.626 m)   Wt 207 lb (93.9 kg)   SpO2 100%   BMI 35.53 kg/m   GEN: A/Ox3; pleasant , NAD, well nourished    HEENT:  /AT,  EACs-clear, TMs-wnl, NOSE-clear, THROAT-clear, no lesions, no postnasal drip or exudate noted.   NECK:  Supple w/ fair ROM; no JVD; normal carotid impulses w/o bruits; no thyromegaly or nodules palpated; prominent cervical lymph nodes and tenderness  RESP  Clear  P & A; w/o, wheezes/ rales/ or rhonchi. no accessory muscle use, no dullness to percussion  CARD:  RRR, no m/r/g, no peripheral  edema, pulses intact, no cyanosis or clubbing.  GI:   Soft & nt; nml bowel sounds; no organomegaly or masses detected.   Musco: Warm bil, no deformities or joint swelling noted.   Neuro: alert, no focal deficits noted.    Skin: Warm, no lesions or rashes    Lab Results:  CBC    Component Value Date/Time   WBC 7.1 06/19/2020 1215   RBC 4.36 06/19/2020 1215   HGB 9.3 (L) 06/19/2020 1215   HCT 28.5 (L) 06/19/2020 1215   PLT 362.0 06/19/2020 1215   MCV 65.4 Repeated and verified X2. (L) 06/19/2020 1215   MCH 20.2 (L) 09/29/2017 1420   MCHC 32.6 06/19/2020 1215   RDW 18.8 (H) 06/19/2020 1215   LYMPHSABS 1.7 06/19/2020 1215   MONOABS 0.6 06/19/2020 1215   EOSABS 0.1 06/19/2020 1215   BASOSABS 0.1 06/19/2020 1215    BMET    Component Value Date/Time   NA 136 06/19/2020 1217   K 3.9 06/19/2020 1217    CL 102 06/19/2020 1217   CO2 24 06/19/2020 1217   GLUCOSE 97 06/19/2020 1217   BUN 15 06/19/2020 1217   CREATININE 1.04 06/19/2020 1217   CREATININE 1.14 (H) 09/29/2017 1420   CALCIUM 9.0 06/19/2020 1217   GFRNONAA >60 12/26/2015 0800   GFRAA >60 12/26/2015 0800    BNP No results found for: BNP  ProBNP No results found for: PROBNP  Imaging: MR ABDOMEN MRCP W WO CONTAST  Result Date: 07/09/2020 CLINICAL DATA:  Steatohepatitis, sarcoidosis in a 44 year old female EXAM: MRI ABDOMEN WITHOUT AND WITH CONTRAST (INCLUDING MRCP) TECHNIQUE: Multiplanar multisequence MR imaging of the abdomen was performed both before and after the administration of intravenous contrast. Heavily T2-weighted images of the biliary and pancreatic ducts were obtained, and three-dimensional MRCP images were rendered by post processing. CONTRAST:  24m MULTIHANCE GADOBENATE DIMEGLUMINE 529 MG/ML IV SOLN COMPARISON:  Chest CT from 05/29/2020 and prior abdominal CTs dating back to 2017 FINDINGS: Lower chest: Signs of peribronchovascular nodularity not as well demonstrated as on the recent chest CT. No effusion. Hepatobiliary: Liver span is enlarged measuring approximately 20 cm craniocaudal span. Heterogeneous appearance of the liver on T2 weighted imaging. Lace-like pattern of T2 signal throughout much of the liver. Scattered foci of subtle T2 hypointensity not as discrete as in the spleen. Note that the patient became ill following contrast administration so only an arterial phase and a 5 minutes post-contrast phase are provided. On arterial phase there is a hyperenhancing focus with peripheral low signal measuring 2.9 x 2.1 cm (image 74, series 19) this is not seen as a discrete abnormality on other sequences. Near isointense in this area to surrounding liver on the 5 minutes delay. Areas of hypoenhancement elsewhere in the liver with numerous small areas of nodularity. No additional arterial enhancing, focal lesion. No  biliary duct dilation though MRCP sequences are mildly limited by respiratory motion. Pancreas: Pancreas is normal without ductal dilation or sign of inflammation. Spleen: Spleen slightly larger than on the previous study measuring nearly 10 cm craniocaudal span as compared to 7 cm on the study of 2017 from April of 2017. Largest area in the spleen measuring approximately 2.9 cm (image 39, series 19) with lesions throughout the spleen ranging from this size to less than a cm, all areas of the spleen are involved. The lesions display baseline T2 hypointensity. Adrenals/Urinary Tract:  Adrenal glands are normal. Symmetric renal enhancement. Subtle area of hypointensity in the LEFT kidney measuring 1.5 x  1.5 cm (image 70 of series 21). Area displays non masslike characteristics Stomach/Bowel: Peripheral enhancement of the distal esophagus. Mildly dilated adjacent venous structures in the lesser sac, hepatic gastric ligament no acute gastrointestinal process. Bowel incompletely imaged. Vascular/Lymphatic: Vascular structures in the abdomen are patent with upper abdominal and retroperitoneal lymph nodes which are at the upper limits of normal, nonspecific in the setting of liver disease and with presumed diagnosis of sarcoidosis. Other:  No ascites. Musculoskeletal: No suspicious bone lesions identified. IMPRESSION: 1. Hyperenhancing area in the medial segment of the LEFT hepatic lobe at the boundary between hepatic subsegment IV B and V. Seen on a background of nodular hepatic contours and hepatic heterogeneity, atypical for sarcoid associated lesion given arterial enhancement. Other areas more typical of sarcoid but remain nonspecific. Biopsy may be helpful as warranted of both adjacent liver and lesion if this can be visualized on ultrasound. Overall pattern of liver disease could be seen in the setting of sarcoidosis overall, concomitant causes for liver disease are however frequently seen in the setting of  sarcoidosis. 2. Splenic lesions could also be related to sarcoidosis and have a more typical appearance. Consider three-month follow-up for above findings and liver biopsy as discussed above. 3. Potential portal hypertension with varices versus distal esophageal hyperenhancement. Endoscopic assessment may be warranted given the presence of liver disease. 4. No signs of steatosis. Electronically Signed   By: Zetta Bills M.D.   On: 07/09/2020 10:48   ECHOCARDIOGRAM COMPLETE  Result Date: 07/07/2020    ECHOCARDIOGRAM REPORT   Patient Name:   DEDRIA ENDRES Date of Exam: 07/07/2020 Medical Rec #:  [id number]      Height:       64.0 in Accession #:    [id number]     Weight:       207.4 lb Date of Birth:  1975/10/08      BSA:          1.987 m Patient Age:    13 years       BP:           172/106 mmHg Patient Gender: F              HR:           83 bpm. Exam Location:  Outpatient Procedure: 2D Echo, Color Doppler, Cardiac Doppler and Strain Analysis Indications:    R06.9 DOE, assess for sarcoidosis  History:        Patient has no prior history of Echocardiogram examinations.                 Risk Factors:Hypertension and Dyslipidemia.  Sonographer:    Raquel Sarna Senior RDCS Referring Phys: [place]  1. Left ventricular ejection fraction, by estimation, is 55 to 60%. The left ventricle has normal function. The left ventricle has no regional wall motion abnormalities. There is mild asymmetric left ventricular hypertrophy of the septal segment. Left ventricular diastolic parameters were normal.  2. Right ventricular systolic function is normal. The right ventricular size is normal. There is normal pulmonary artery systolic pressure. The estimated right ventricular systolic pressure is 32.6 mmHg.  3. The mitral valve is normal in structure. Mild mitral valve regurgitation. No evidence of mitral stenosis.  4. The aortic valve is tricuspid. Aortic valve regurgitation is not visualized. No aortic stenosis  is present.  5. The inferior vena cava is normal in size with <50% respiratory variability, suggesting right atrial pressure of 8 mmHg. FINDINGS  Left  Ventricle: Left ventricular ejection fraction, by estimation, is 55 to 60%. The left ventricle has normal function. The left ventricle has no regional wall motion abnormalities. Global longitudinal strain performed but not reported based on interpreter judgement due to suboptimal tracking. The left ventricular internal cavity size was normal in size. There is mild asymmetric left ventricular hypertrophy of the septal segment. Left ventricular diastolic parameters were normal. Right Ventricle: The right ventricular size is normal. No increase in right ventricular wall thickness. Right ventricular systolic function is normal. There is normal pulmonary artery systolic pressure. The tricuspid regurgitant velocity is 2.29 m/s, and  with an assumed right atrial pressure of 8 mmHg, the estimated right ventricular systolic pressure is 77.4 mmHg. Left Atrium: Left atrial size was normal in size. Right Atrium: Right atrial size was normal in size. Pericardium: There is no evidence of pericardial effusion. Mitral Valve: The mitral valve is normal in structure. Mild mitral valve regurgitation. No evidence of mitral valve stenosis. Tricuspid Valve: The tricuspid valve is normal in structure. Tricuspid valve regurgitation is trivial. No evidence of tricuspid stenosis. Aortic Valve: The aortic valve is tricuspid. Aortic valve regurgitation is not visualized. No aortic stenosis is present. Pulmonic Valve: The pulmonic valve was grossly normal. Pulmonic valve regurgitation is trivial. No evidence of pulmonic stenosis. Aorta: The aortic root is normal in size and structure and the ascending aorta was not well visualized. Venous: The inferior vena cava is normal in size with less than 50% respiratory variability, suggesting right atrial pressure of 8 mmHg. IAS/Shunts: No atrial level  shunt detected by color flow Doppler.  LEFT VENTRICLE PLAX 2D LVIDd:         4.10 cm  Diastology LVIDs:         2.90 cm  LV e' medial:    7.72 cm/s LV PW:         0.90 cm  LV E/e' medial:  11.7 LV IVS:        1.20 cm  LV e' lateral:   15.10 cm/s LVOT diam:     2.10 cm  LV E/e' lateral: 6.0 LV SV:         78 LV SV Index:   39 LVOT Area:     3.46 cm  RIGHT VENTRICLE RV S prime:     12.40 cm/s TAPSE (M-mode): 2.4 cm LEFT ATRIUM             Index       RIGHT ATRIUM           Index LA diam:        3.80 cm 1.91 cm/m  RA Area:     15.00 cm LA Vol (A2C):   54.1 ml 27.23 ml/m RA Volume:   38.50 ml  19.38 ml/m LA Vol (A4C):   57.4 ml 28.89 ml/m LA Biplane Vol: 57.9 ml 29.14 ml/m  AORTIC VALVE LVOT Vmax:   115.00 cm/s LVOT Vmean:  73.400 cm/s LVOT VTI:    0.224 m  AORTA Ao Root diam: 3.10 cm Ao Asc diam:  3.30 cm MITRAL VALVE               TRICUSPID VALVE MV Area (PHT): 3.60 cm    TR Peak grad:   21.0 mmHg MV Decel Time: 211 msec    TR Vmax:        229.00 cm/s MV E velocity: 90.40 cm/s MV A velocity: 68.10 cm/s  SHUNTS MV E/A ratio:  1.33  Systemic VTI:  0.22 m                            Systemic Diam: 2.10 cm Cherlynn Kaiser MD Electronically signed by Cherlynn Kaiser MD Signature Date/Time: 07/07/2020/4:55:40 PM    Final       No flowsheet data found.  No results found for: NITRICOXIDE      Assessment & Plan:   Abnormal finding on imaging of liver Abnormal MRI of the abdomen with liver nodularity, possible portal hypertension with varices versus distal esophageal hyper enhancement. Given the possible high probability for diagnosis of sarcoidosis patient will need further evaluation LFTs are normal.  Platelets are normal.  INR is just slightly elevated Patient is anemic but this appears to be chronic.  No evidence of acute bleeding. Previous iron levels were low. Have suggested she begin iron supplement.  Follow-up with primary care for her ongoing anemia. Will refer to GI for further  evaluation of her liver nodularity with possible underlying diagnosis of sarcoidosis. Avoid hepatotoxins.   Anemia Chronic anemia with possible iron deficiency with previous low iron levels. Patient will need further work-up for this. Have suggested she begin iron supplements. Follow-up with primary care.  She is also been referred to GI.  Abnormal CT scan of lung Abnormal CT chest with pulmonary nodularity, probable bilateral hilar adenopathy-elevated ACE level and ESR. Patient has significant family history of sarcoidosis.  Has clinical symptoms of cough night sweats and chills.  QuantiFERON gold test was negative. She has a high probability for underlying sarcoidosis. Case was discussed in detail with Dr. Chase Caller. Unfortunately patient will be without medical insurance in the next few days. Discussed the importance of undergoing diagnostic testing with probable bronchoscopy with biopsy to get definitive diagnosis along with cultures. She is adamant that she wants to start treatment for possible sarcoid as she may have to postpone diagnostic testing due to lack of insurance.  Significant conversation regarding sarcoidosis treatment with steroids and potential complications also treatment with steroids without definitive diagnosis.  And potential complications.. Given that this is most likely underlying sarcoidosis will begin steroids with a tapering dose over the next 4 to 6 weeks down to 10 mg. She has agreed to get referrals to GI.  And our interventionalists for consideration of bronchoscopy/possible EBUS.  Would repeat HIV testing on return , also check BMET for blood sugars .  Patient education on diet and potential for DM development as she has mention of pre DM in past.  Will taper quickly below prednisone 20 mg hold on PJP prophylaxis at this time.  Plan  Patient Instructions  Begin Prednisone 16m daily for 2 weeks, then 344mdaily for 2 weeks then 2072maily for 2 weeks and  then hold at 56m62mily  Begin Vitamin D 3 5000IU daily  Begin Iron supplements Twice daily  .  Follow up with Dr. BurcElease Hashimoto Vitamin D Deficiency and Anemia .  I will be in touch regarding biopsy.  Refer to GI .  Low sweet diet  Yearly eye exams  Delsym 2 tsp Twice daily  As needed  Cough  Tessalon Three times Three times a day  As needed  Cough .  Follow up with Dr. RamaChase Callereeks and As needed   Please contact office for sooner follow up if symptoms do not improve or worsen or seek emergency care        Vitamin D deficiency  Vitamin D deficiency.  Advised to follow-up primary care.  She can begin vitamin D over-the-counter at 5000 units daily.     Rexene Edison, NP 07/13/2020

## 2020-07-13 NOTE — Assessment & Plan Note (Signed)
Abnormal MRI of the abdomen with liver nodularity, possible portal hypertension with varices versus distal esophageal hyper enhancement. Given the possible high probability for diagnosis of sarcoidosis patient will need further evaluation LFTs are normal.  Platelets are normal.  INR is just slightly elevated Patient is anemic but this appears to be chronic.  No evidence of acute bleeding. Previous iron levels were low. Have suggested she begin iron supplement.  Follow-up with primary care for her ongoing anemia. Will refer to GI for further evaluation of her liver nodularity with possible underlying diagnosis of sarcoidosis. Avoid hepatotoxins.

## 2020-07-13 NOTE — Patient Instructions (Signed)
Begin Prednisone 40mg  daily for 2 weeks, then 30mg  daily for 2 weeks then 20mg  daily for 2 weeks and then hold at 10mg  daily  Begin Vitamin D 3 5000IU daily  Begin Iron supplements Twice daily  .  Follow up with Dr. Elease Hashimoto for Vitamin D Deficiency and Anemia .  I will be in touch regarding biopsy.  Refer to GI .  Low sweet diet  Yearly eye exams  Delsym 2 tsp Twice daily  As needed  Cough  Tessalon Three times Three times a day  As needed  Cough .  Follow up with Dr. Chase Caller 4 weeks and As needed   Please contact office for sooner follow up if symptoms do not improve or worsen or seek emergency care

## 2020-07-13 NOTE — Assessment & Plan Note (Addendum)
Abnormal CT chest with pulmonary nodularity, probable bilateral hilar adenopathy-elevated ACE level and ESR. Patient has significant family history of sarcoidosis.  Has clinical symptoms of cough night sweats and chills.  QuantiFERON gold test was negative. She has a high probability for underlying sarcoidosis. Case was discussed in detail with Dr. Chase Caller. Unfortunately patient will be without medical insurance in the next few days. Discussed the importance of undergoing diagnostic testing with probable bronchoscopy with biopsy to get definitive diagnosis along with cultures. She is adamant that she wants to start treatment for possible sarcoid as she may have to postpone diagnostic testing due to lack of insurance.  Significant conversation regarding sarcoidosis treatment with steroids and potential complications also treatment with steroids without definitive diagnosis.  And potential complications.. Given that this is most likely underlying sarcoidosis will begin steroids with a tapering dose over the next 4 to 6 weeks down to 10 mg. She has agreed to get referrals to GI.  And our interventionalists for consideration of bronchoscopy/possible EBUS.  Would repeat HIV testing on return , also check BMET for blood sugars .  Patient education on diet and potential for DM development as she has mention of pre DM in past.  Will taper quickly below prednisone 20 mg hold on PJP prophylaxis at this time.  Plan  Patient Instructions  Begin Prednisone 61m daily for 2 weeks, then 329mdaily for 2 weeks then 2055maily for 2 weeks and then hold at 79m44mily  Begin Vitamin D 3 5000IU daily  Begin Iron supplements Twice daily  .  Follow up with Dr. BurcElease Hashimoto Vitamin D Deficiency and Anemia .  I will be in touch regarding biopsy.  Refer to GI .  Low sweet diet  Yearly eye exams  Delsym 2 tsp Twice daily  As needed  Cough  Tessalon Three times Three times a day  As needed  Cough .  Follow up with  Dr. RamaChase Callereeks and As needed   Please contact office for sooner follow up if symptoms do not improve or worsen or seek emergency care

## 2020-07-13 NOTE — Assessment & Plan Note (Signed)
Vitamin D deficiency.  Advised to follow-up primary care.  She can begin vitamin D over-the-counter at 5000 units daily.

## 2020-08-15 ENCOUNTER — Encounter: Payer: Self-pay | Admitting: Family Medicine

## 2020-08-18 ENCOUNTER — Telehealth (INDEPENDENT_AMBULATORY_CARE_PROVIDER_SITE_OTHER): Payer: 59 | Admitting: Family Medicine

## 2020-08-18 ENCOUNTER — Encounter: Payer: Self-pay | Admitting: Family Medicine

## 2020-08-18 DIAGNOSIS — G4700 Insomnia, unspecified: Secondary | ICD-10-CM | POA: Diagnosis not present

## 2020-08-18 DIAGNOSIS — R252 Cramp and spasm: Secondary | ICD-10-CM | POA: Diagnosis not present

## 2020-08-18 DIAGNOSIS — D509 Iron deficiency anemia, unspecified: Secondary | ICD-10-CM | POA: Diagnosis not present

## 2020-08-18 DIAGNOSIS — E559 Vitamin D deficiency, unspecified: Secondary | ICD-10-CM

## 2020-08-18 MED ORDER — VITAMIN D (ERGOCALCIFEROL) 1.25 MG (50000 UNIT) PO CAPS: 50000.0000 [IU] | ORAL_CAPSULE | ORAL | 3 refills | Status: DC

## 2020-08-18 MED ORDER — CYCLOBENZAPRINE HCL 5 MG PO TABS: ORAL_TABLET | ORAL | 3 refills | Status: DC

## 2020-08-18 MED ORDER — ALBUTEROL SULFATE HFA 108 (90 BASE) MCG/ACT IN AERS: 2.0000 | INHALATION_SPRAY | Freq: Four times a day (QID) | RESPIRATORY_TRACT | 2 refills | Status: DC | PRN

## 2020-08-18 NOTE — Progress Notes (Signed)
Patient ID: Ashley Henry, female   DOB: 07-05-1976, 44 y.o.   MRN: [id number]   This visit type was conducted due to national recommendations for restrictions regarding the COVID-19 pandemic in an effort to limit this patient's exposure and mitigate transmission in our community.   Virtual Visit via Video Note  I connected with Ashley Henry on 08/18/20 at  7:30 AM EST by a video enabled telemedicine application and verified that I am speaking with the correct person using two identifiers.  Location patient: home Location provider:work or home office Persons participating in the virtual visit: patient, provider  I discussed the limitations of evaluation and management by telemedicine and the availability of in person appointments. The patient expressed understanding and agreed to proceed.   HPI:  Season called complaining of increased muscle cramps especially in her hands feet and neck muscles.  These are frequently occurring at night.  She feels she is staying well-hydrated.  She is exercising regularly and has had no difficulties with exercise.  Recently diagnosed with sarcoidosis and is on prednisone 20 mg daily.  This is interfering with sleep.  She has had insomnia ever since starting the prednisone.  Frequently only getting about 3 hours sleep at night.  Multiple labs done recently through pulmonary.  These were reviewed.  Her hemoglobin came back 9.3 with microcytic indices.  She had chronic anemia in the past.  No recent ferritin levels.  Her menses are regular and not particularly heavy.  She is currently not taking any iron supplementation.  She had very low vitamin D level 8.27.  Does not take any vitamin D supplementation.  ROS: See pertinent positives and negatives per HPI.  Past Medical History:  Diagnosis Date  . Anemia   . HYPERLIPIDEMIA 10/11/2009  . Hypertension 2010   no meds now  . Kidney stones   . Pre-diabetes    HgbA1c 6.2 on 09-30-17  . Renal disorder     Past  Surgical History:  Procedure Laterality Date  . BREAST REDUCTION SURGERY  1999  . CESAREAN SECTION  2004  . Cysts removed from chest wall    . EYE SURGERY Left   . MASS EXCISION N/A 04/14/2018   Procedure: EXCISION OF CHEST WALL MASS;  Surgeon: Coralie Keens, MD;  Location: [place];  Service: General;  Laterality: N/A;    Family History  Problem Relation Age of Onset  . Hypertension Mother   . Diabetes Mother   . Hypertension Father   . Stomach cancer Paternal Grandmother   . Stomach cancer Maternal Uncle   . Heart disease Maternal Uncle   . Lung cancer Maternal Grandfather   . Prostate cancer Maternal Uncle   . Thyroid disease Maternal Uncle     SOCIAL HX: Non-smoker   Current Outpatient Medications:  .  albuterol (PROVENTIL) (2.5 MG/3ML) 0.083% nebulizer solution, Take 3 mLs (2.5 mg total) by nebulization every 6 (six) hours as needed for wheezing or shortness of breath., Disp: 75 mL, Rfl: 5 .  benzonatate (TESSALON) 100 MG capsule, TAKE 1 CAPSULE(100 MG) BY MOUTH THREE TIMES DAILY AS NEEDED FOR COUGH, Disp: 40 capsule, Rfl: 2 .  BEPREVE 1.5 % SOLN, Place 1 drop into both eyes daily., Disp: , Rfl: 10 .  Norgestimate-Ethinyl Estradiol Triphasic (ORTHO TRI-CYCLEN LO) 0.18/0.215/0.25 MG-25 MCG tab, Take 1 tablet by mouth daily., Disp: 28 tablet, Rfl: 2 .  omeprazole (PRILOSEC) 40 MG capsule, Take 1 capsule (40 mg total) by mouth daily., Disp: 30  capsule, Rfl: 3 .  predniSONE (DELTASONE) 10 MG tablet, 4 tabs daily for 2 weeks, 3 tabs daily for 2 weeks , 2 tabs daily for 2 weeks and 1 tab daily -hold at this dose., Disp: 100 tablet, Rfl: 1 .  albuterol (VENTOLIN HFA) 108 (90 Base) MCG/ACT inhaler, Inhale 2 puffs into the lungs every 6 (six) hours as needed for wheezing or shortness of breath., Disp: 18 g, Rfl: 2 .  cyclobenzaprine (FLEXERIL) 5 MG tablet, Take 1 to 2 tablets by mouth nightly as needed muscle spasms, Disp: 60 tablet, Rfl: 3 .  Vitamin D,  Ergocalciferol, (DRISDOL) 1.25 MG (50000 UNIT) CAPS capsule, Take 1 capsule (50,000 Units total) by mouth every 7 (seven) days., Disp: 12 capsule, Rfl: 3  EXAM:  VITALS per patient if applicable:  GENERAL: alert, oriented, appears well and in no acute distress  HEENT: atraumatic, conjunttiva clear, no obvious abnormalities on inspection of external nose and ears  NECK: normal movements of the head and neck  LUNGS: on inspection no signs of respiratory distress, breathing rate appears normal, no obvious gross SOB, gasping or wheezing  CV: no obvious cyanosis  MS: moves all visible extremities without noticeable abnormality  PSYCH/NEURO: pleasant and cooperative, no obvious depression or anxiety, speech and thought processing grossly intact  ASSESSMENT AND PLAN:  Discussed the following assessment and plan:  #1 frequent muscle cramps. -She is encouraged to continue to stay well-hydrated -Consider over-the-counter B complex multivitamin -Consider over-the-counter magnesium 400 to 500 mg daily -Start vitamin D by prescription as below -Patient requesting muscle relaxer which she states is helped in the past.  We wrote for Flexeril 5 mg to take 1-2 nightly as needed  #2 vitamin D deficiency which is severe -Start vitamin D supplementation 50,000 international units once weekly and reassess vitamin D level in 3 months  #3 history of iron deficiency anemia.  Recent hemoglobin 9.3 -Start over-the-counter iron sulfate 325 mg daily -Recheck hemoglobin and ferritin with follow-up lab  #4 insomnia probably related to prednisone as above.  Hopefully this will improve some with the Flexeril  #5 sarcoidosis.  Follow-up pulmonary and currently on prednisone.  Patient reminded to get yearly eye exam to assess for ocular inflammation related to sarcoid     I discussed the assessment and treatment plan with the patient. The patient was provided an opportunity to ask questions and all were  answered. The patient agreed with the plan and demonstrated an understanding of the instructions.   The patient was advised to call back or seek an in-person evaluation if the symptoms worsen or if the condition fails to improve as anticipated.     Carolann Littler, MD

## 2020-10-25 ENCOUNTER — Encounter: Payer: Self-pay | Admitting: Family Medicine

## 2020-10-25 ENCOUNTER — Other Ambulatory Visit: Payer: Self-pay | Admitting: Family Medicine

## 2020-10-25 MED ORDER — BENZONATATE 100 MG PO CAPS
ORAL_CAPSULE | ORAL | 2 refills | Status: DC
Start: 2020-10-25 — End: 2021-01-17

## 2021-01-17 ENCOUNTER — Other Ambulatory Visit: Payer: Self-pay | Admitting: Family Medicine

## 2021-02-06 ENCOUNTER — Encounter: Payer: Self-pay | Admitting: Family Medicine

## 2021-02-09 ENCOUNTER — Telehealth (INDEPENDENT_AMBULATORY_CARE_PROVIDER_SITE_OTHER): Payer: 59 | Admitting: Family Medicine

## 2021-02-09 ENCOUNTER — Other Ambulatory Visit: Payer: Self-pay

## 2021-02-09 DIAGNOSIS — L309 Dermatitis, unspecified: Secondary | ICD-10-CM | POA: Diagnosis not present

## 2021-02-09 DIAGNOSIS — H1013 Acute atopic conjunctivitis, bilateral: Secondary | ICD-10-CM

## 2021-02-09 MED ORDER — HYDROCORTISONE BUTYRATE 0.1 % EX CREA: 1.0000 "application " | TOPICAL_CREAM | Freq: Two times a day (BID) | CUTANEOUS | 2 refills | Status: DC

## 2021-02-09 MED ORDER — BEPREVE 1.5 % OP SOLN
1.0000 [drp] | Freq: Every day | OPHTHALMIC | 5 refills | Status: DC
Start: 2021-02-09 — End: 2022-05-10

## 2021-02-09 NOTE — Progress Notes (Signed)
Patient ID: Ashley Henry, female   DOB: 07-Jul-1976, 45 y.o.   MRN: [id number]   This visit type was conducted due to national recommendations for restrictions regarding the COVID-19 pandemic in an effort to limit this patient's exposure and mitigate transmission in our community.   Virtual Visit via Video Note  I connected with Ashley Henry on 02/09/21 at  7:00 AM EDT by a video enabled telemedicine application and verified that I am speaking with the correct person using two identifiers.  Location patient: home Location provider:work or home office Persons participating in the virtual visit: patient, provider  I discussed the limitations of evaluation and management by telemedicine and the availability of in person appointments. The patient expressed understanding and agreed to proceed.   HPI: Ashley Henry called for the following items  She has pruritic symptoms and has been diagnosed with allergic gingivitis in the past.  She basically does need refills of topical eyedrop which is worked well for her which is [employer].  She has gotten this previously from ophthalmologist.  She has pruritic rash right posterior thigh.  She has Locoid cream which is worked well in the past and requesting refills.  She does have history of sarcoidosis.  Has had similar rash in the past.  Slightly dry and scaly.   ROS: See pertinent positives and negatives per HPI.  Past Medical History:  Diagnosis Date  . Anemia   . HYPERLIPIDEMIA 10/11/2009  . Hypertension 2010   no meds now  . Kidney stones   . Pre-diabetes    HgbA1c 6.2 on 09-30-17  . Renal disorder     Past Surgical History:  Procedure Laterality Date  . BREAST REDUCTION SURGERY  1999  . CESAREAN SECTION  2004  . Cysts removed from chest wall    . EYE SURGERY Left   . MASS EXCISION N/A 04/14/2018   Procedure: EXCISION OF CHEST WALL MASS;  Surgeon: Coralie Keens, MD;  Location: [place];  Service: General;  Laterality: N/A;     Family History  Problem Relation Age of Onset  . Hypertension Mother   . Diabetes Mother   . Hypertension Father   . Stomach cancer Paternal Grandmother   . Stomach cancer Maternal Uncle   . Heart disease Maternal Uncle   . Lung cancer Maternal Grandfather   . Prostate cancer Maternal Uncle   . Thyroid disease Maternal Uncle     SOCIAL HX: Non-smoker   Current Outpatient Medications:  .  hydrocortisone butyrate (LOCOID) 0.1 % CREA cream, Apply 1 application topically 2 (two) times daily., Disp: 15 g, Rfl: 2 .  albuterol (PROVENTIL) (2.5 MG/3ML) 0.083% nebulizer solution, Take 3 mLs (2.5 mg total) by nebulization every 6 (six) hours as needed for wheezing or shortness of breath., Disp: 75 mL, Rfl: 5 .  albuterol (VENTOLIN HFA) 108 (90 Base) MCG/ACT inhaler, Inhale 2 puffs into the lungs every 6 (six) hours as needed for wheezing or shortness of breath., Disp: 18 g, Rfl: 2 .  benzonatate (TESSALON) 100 MG capsule, TAKE 1 CAPSULE(100 MG) BY MOUTH THREE TIMES DAILY AS NEEDED FOR COUGH, Disp: 40 capsule, Rfl: 2 .  BEPREVE 1.5 % SOLN, Place 1 drop into both eyes daily., Disp: 10 mL, Rfl: 5 .  cyclobenzaprine (FLEXERIL) 5 MG tablet, Take 1 to 2 tablets by mouth nightly as needed muscle spasms, Disp: 60 tablet, Rfl: 3 .  Norgestimate-Ethinyl Estradiol Triphasic (ORTHO TRI-CYCLEN LO) 0.18/0.215/0.25 MG-25 MCG tab, Take 1 tablet by mouth daily.,  Disp: 28 tablet, Rfl: 2 .  omeprazole (PRILOSEC) 40 MG capsule, Take 1 capsule (40 mg total) by mouth daily., Disp: 30 capsule, Rfl: 3 .  predniSONE (DELTASONE) 10 MG tablet, 4 tabs daily for 2 weeks, 3 tabs daily for 2 weeks , 2 tabs daily for 2 weeks and 1 tab daily -hold at this dose., Disp: 100 tablet, Rfl: 1 .  Vitamin D, Ergocalciferol, (DRISDOL) 1.25 MG (50000 UNIT) CAPS capsule, Take 1 capsule (50,000 Units total) by mouth every 7 (seven) days., Disp: 12 capsule, Rfl: 3  EXAM:  VITALS per patient if applicable:  GENERAL: alert, oriented,  appears well and in no acute distress  HEENT: atraumatic, conjunttiva clear, no obvious abnormalities on inspection of external nose and ears  NECK: normal movements of the head and neck  LUNGS: on inspection no signs of respiratory distress, breathing rate appears normal, no obvious gross SOB, gasping or wheezing  CV: no obvious cyanosis  MS: moves all visible extremities without noticeable abnormality  PSYCH/NEURO: pleasant and cooperative, no obvious depression or anxiety, speech and thought processing grossly intact  ASSESSMENT AND PLAN:  Discussed the following assessment and plan:  #1 allergic conjunctivitis both eyes -Refill Bepreve eyedrops 1 drop each eye twice daily as needed  #2 eczematous type rash right posterior thigh -Refill Locoid cream for as needed use     I discussed the assessment and treatment plan with the patient. The patient was provided an opportunity to ask questions and all were answered. The patient agreed with the plan and demonstrated an understanding of the instructions.   The patient was advised to call back or seek an in-person evaluation if the symptoms worsen or if the condition fails to improve as anticipated.     Carolann Littler, MD

## 2021-02-21 ENCOUNTER — Encounter: Payer: Self-pay | Admitting: Family Medicine

## 2021-02-22 MED ORDER — FLUTICASONE PROPIONATE 0.05 % EX CREA: TOPICAL_CREAM | Freq: Two times a day (BID) | CUTANEOUS | 2 refills | Status: DC

## 2021-02-22 NOTE — Telephone Encounter (Signed)
Prescription sent

## 2021-03-24 ENCOUNTER — Other Ambulatory Visit: Payer: Self-pay | Admitting: Family Medicine

## 2021-04-06 ENCOUNTER — Other Ambulatory Visit: Payer: Self-pay | Admitting: Family Medicine

## 2021-04-21 ENCOUNTER — Other Ambulatory Visit: Payer: Self-pay | Admitting: Family Medicine

## 2021-04-25 ENCOUNTER — Telehealth (INDEPENDENT_AMBULATORY_CARE_PROVIDER_SITE_OTHER): Payer: 59 | Admitting: Family Medicine

## 2021-04-25 ENCOUNTER — Other Ambulatory Visit: Payer: Self-pay

## 2021-04-25 DIAGNOSIS — Z3009 Encounter for other general counseling and advice on contraception: Secondary | ICD-10-CM

## 2021-04-25 MED ORDER — NORGESTIM-ETH ESTRAD TRIPHASIC 0.18/0.215/0.25 MG-25 MCG PO TABS: 1.0000 | ORAL_TABLET | Freq: Every day | ORAL | 11 refills | Status: DC

## 2021-04-25 NOTE — Progress Notes (Signed)
Patient ID: Ashley Henry, female   DOB: 04/15/1976, 45 y.o.   MRN: [id number]   This visit type was conducted due to national recommendations for restrictions regarding the COVID-19 pandemic in an effort to limit this patient's exposure and mitigate transmission in our community.   Virtual Visit via Video Note  I connected with [name] on 04/25/21 at  7:00 AM EDT by a video enabled telemedicine application and verified that I am speaking with the correct person using two identifiers.  Location patient: home Location provider:work or home office Persons participating in the virtual visit: patient, provider  I discussed the limitations of evaluation and management by telemedicine and the availability of in person appointments. The patient expressed understanding and agreed to proceed.   HPI:  Ashley Henry is calling basically requesting refill of her current OCP which is Tri-Lo-Sprintec.  She was placed on this by her gynecologist.  Unfortunately, her current GYN is no longer taking her insurance.  She would like to get this filled to avoid any lapse.  Ashley Henry is a non-smoker.  She does not have any contraindications to OCPs such as history of migraine with aura, history of DVT/PE, liver disorder, etc. She has monogamous and no history of STDs.  She has tolerated current contraceptive pill well in the past.   ROS: See pertinent positives and negatives per HPI.  Past Medical History:  Diagnosis Date   Anemia    HYPERLIPIDEMIA 10/11/2009   Hypertension 2010   no meds now   Kidney stones    Pre-diabetes    HgbA1c 6.2 on 09-30-17   Renal disorder     Past Surgical History:  Procedure Laterality Date   BREAST REDUCTION SURGERY  1999   CESAREAN SECTION  2004   Cysts removed from chest wall     EYE SURGERY Left    MASS EXCISION N/A 04/14/2018   Procedure: EXCISION OF CHEST WALL MASS;  Surgeon: Coralie Keens, MD;  Location: [place];  Service: General;  Laterality: N/A;     Family History  Problem Relation Age of Onset   Hypertension Mother    Diabetes Mother    Hypertension Father    Stomach cancer Paternal Grandmother    Stomach cancer Maternal Uncle    Heart disease Maternal Uncle    Lung cancer Maternal Grandfather    Prostate cancer Maternal Uncle    Thyroid disease Maternal Uncle     SOCIAL HX: Non-smoker   Current Outpatient Medications:    Norgestimate-Ethinyl Estradiol Triphasic (TRI-LO-SPRINTEC) 0.18/0.215/0.25 MG-25 MCG tab, Take 1 tablet by mouth daily., Disp: 28 tablet, Rfl: 11   albuterol (PROVENTIL) (2.5 MG/3ML) 0.083% nebulizer solution, Take 3 mLs (2.5 mg total) by nebulization every 6 (six) hours as needed for wheezing or shortness of breath., Disp: 75 mL, Rfl: 5   albuterol (VENTOLIN HFA) 108 (90 Base) MCG/ACT inhaler, Inhale 2 puffs into the lungs every 6 (six) hours as needed for wheezing or shortness of breath., Disp: 18 g, Rfl: 2   benzonatate (TESSALON) 100 MG capsule, TAKE 1 CAPSULE(100 MG) BY MOUTH THREE TIMES DAILY AS NEEDED FOR COUGH, Disp: 40 capsule, Rfl: 2   BEPREVE 1.5 % SOLN, Place 1 drop into both eyes daily., Disp: 10 mL, Rfl: 5   cyclobenzaprine (FLEXERIL) 5 MG tablet, TAKE 1 TO 2 TABLETS BY MOUTH EVERY NIGHT AS NEEDED FOR MUSCLE SPASMS, Disp: 60 tablet, Rfl: 3   fluticasone (CUTIVATE) 0.05 % cream, Apply topically 2 (two) times daily., Disp: 15  g, Rfl: 2   hydrocortisone butyrate (LOCOID) 0.1 % CREA cream, Apply 1 application topically 2 (two) times daily., Disp: 15 g, Rfl: 2   omeprazole (PRILOSEC) 40 MG capsule, TAKE 1 CAPSULE(40 MG) BY MOUTH DAILY, Disp: 30 capsule, Rfl: 3   predniSONE (DELTASONE) 10 MG tablet, 4 tabs daily for 2 weeks, 3 tabs daily for 2 weeks , 2 tabs daily for 2 weeks and 1 tab daily -hold at this dose., Disp: 100 tablet, Rfl: 1   Vitamin D, Ergocalciferol, (DRISDOL) 1.25 MG (50000 UNIT) CAPS capsule, Take 1 capsule (50,000 Units total) by mouth every 7 (seven) days., Disp: 12 capsule, Rfl:  3  EXAM:  VITALS per patient if applicable:  GENERAL: alert, oriented, appears well and in no acute distress  HEENT: atraumatic, conjunttiva clear, no obvious abnormalities on inspection of external nose and ears  NECK: normal movements of the head and neck  LUNGS: on inspection no signs of respiratory distress, breathing rate appears normal, no obvious gross SOB, gasping or wheezing  CV: no obvious cyanosis  MS: moves all visible extremities without noticeable abnormality  PSYCH/NEURO: pleasant and cooperative, no obvious depression or anxiety, speech and thought processing grossly intact  ASSESSMENT AND PLAN:  Discussed the following assessment and plan:  Advised about oral contraception  -Patient requesting refill Tri-Lo-Sprintec.  Her last Pap smear was 2/21.  She is low risk.  She does not have any contraindications to use. -We agreed to go ahead and refill this and she will need Pap smear about a year and a half     I discussed the assessment and treatment plan with the patient. The patient was provided an opportunity to ask questions and all were answered. The patient agreed with the plan and demonstrated an understanding of the instructions.   The patient was advised to call back or seek an in-person evaluation if the symptoms worsen or if the condition fails to improve as anticipated.     Carolann Littler, MD

## 2021-07-14 ENCOUNTER — Other Ambulatory Visit: Payer: Self-pay | Admitting: Family Medicine

## 2021-07-16 NOTE — Telephone Encounter (Signed)
Please advise. Should the patient continue this.

## 2021-08-21 ENCOUNTER — Other Ambulatory Visit: Payer: Self-pay | Admitting: Family Medicine

## 2021-08-21 NOTE — Telephone Encounter (Signed)
Last refill- 07/16/21--40 cap, no refills Last VV- 04/25/21  No future office visit scheduled.   Can this patient receive a refill?

## 2021-09-14 ENCOUNTER — Other Ambulatory Visit: Payer: Self-pay | Admitting: Family Medicine

## 2021-10-12 ENCOUNTER — Other Ambulatory Visit: Payer: Self-pay | Admitting: Family Medicine

## 2021-10-26 ENCOUNTER — Other Ambulatory Visit: Payer: Self-pay | Admitting: Family Medicine

## 2021-10-26 NOTE — Telephone Encounter (Signed)
Please advise. Ok to refill? °

## 2021-11-06 ENCOUNTER — Encounter: Payer: Self-pay | Admitting: Family Medicine

## 2021-11-06 ENCOUNTER — Other Ambulatory Visit: Payer: Self-pay | Admitting: Family Medicine

## 2021-11-06 ENCOUNTER — Other Ambulatory Visit: Payer: Self-pay

## 2021-11-06 MED ORDER — FLUTICASONE PROPIONATE 0.05 % EX CREA: TOPICAL_CREAM | Freq: Two times a day (BID) | CUTANEOUS | 0 refills | Status: DC

## 2021-11-22 ENCOUNTER — Ambulatory Visit (INDEPENDENT_AMBULATORY_CARE_PROVIDER_SITE_OTHER): Payer: 59 | Admitting: Internal Medicine

## 2021-11-22 ENCOUNTER — Other Ambulatory Visit: Payer: Self-pay

## 2021-11-22 ENCOUNTER — Encounter: Payer: Self-pay | Admitting: Internal Medicine

## 2021-11-22 VITALS — BP 130/80 | HR 110 | Temp 98.4°F | Ht 64.0 in | Wt 246.6 lb

## 2021-11-22 DIAGNOSIS — R590 Localized enlarged lymph nodes: Secondary | ICD-10-CM

## 2021-11-22 DIAGNOSIS — D869 Sarcoidosis, unspecified: Secondary | ICD-10-CM

## 2021-11-22 DIAGNOSIS — L819 Disorder of pigmentation, unspecified: Secondary | ICD-10-CM

## 2021-11-22 DIAGNOSIS — R0609 Other forms of dyspnea: Secondary | ICD-10-CM

## 2021-11-22 DIAGNOSIS — R932 Abnormal findings on diagnostic imaging of liver and biliary tract: Secondary | ICD-10-CM

## 2021-11-22 DIAGNOSIS — E559 Vitamin D deficiency, unspecified: Secondary | ICD-10-CM

## 2021-11-22 DIAGNOSIS — R053 Chronic cough: Secondary | ICD-10-CM

## 2021-11-22 DIAGNOSIS — R768 Other specified abnormal immunological findings in serum: Secondary | ICD-10-CM

## 2021-11-22 DIAGNOSIS — D649 Anemia, unspecified: Secondary | ICD-10-CM

## 2021-11-22 LAB — BASIC METABOLIC PANEL
BUN: 15 mg/dL (ref 6–23)
CO2: 27 mEq/L (ref 19–32)
Calcium: 9.5 mg/dL (ref 8.4–10.5)
Chloride: 99 mEq/L (ref 96–112)
Creatinine, Ser: 1.12 mg/dL (ref 0.40–1.20)
GFR: 60.04 mL/min (ref >60.00)
Glucose, Bld: 90 mg/dL (ref 70–99)
Potassium: 4 mEq/L (ref 3.5–5.1)
Sodium: 134 mEq/L — ABNORMAL LOW (ref 135–145)

## 2021-11-22 LAB — CBC WITH DIFFERENTIAL/PLATELET
Basophils Absolute: 0.1 10*3/uL (ref 0.0–0.1)
Basophils Relative: 1.1 % (ref 0.0–3.0)
Eosinophils Absolute: 0.1 10*3/uL (ref 0.0–0.7)
Eosinophils Relative: 1.5 % (ref 0.0–5.0)
HCT: 30.3 % — ABNORMAL LOW (ref 36.0–46.0)
Hemoglobin: 9.8 g/dL — ABNORMAL LOW (ref 12.0–15.0)
Lymphocytes Relative: 19.2 % (ref 12.0–46.0)
Lymphs Abs: 1.6 10*3/uL (ref 0.7–4.0)
MCHC: 32.2 g/dL (ref 30.0–36.0)
MCV: 64.9 fl — ABNORMAL LOW (ref 78.0–100.0)
Monocytes Absolute: 0.8 10*3/uL (ref 0.1–1.0)
Monocytes Relative: 9.3 % (ref 3.0–12.0)
Neutro Abs: 5.8 10*3/uL (ref 1.4–7.7)
Neutrophils Relative %: 68.9 % (ref 43.0–77.0)
Platelets: 367 10*3/uL (ref 150.0–400.0)
RBC: 4.68 Mil/uL (ref 3.87–5.11)
RDW: 19.2 % — ABNORMAL HIGH (ref 11.5–15.5)
WBC: 8.5 10*3/uL (ref 4.0–10.5)

## 2021-11-22 LAB — SEDIMENTATION RATE: Sed Rate: >130 mm/hr — ABNORMAL HIGH (ref 0–20)

## 2021-11-22 LAB — HEPATIC FUNCTION PANEL
ALT: 19 U/L (ref 0–35)
AST: 28 U/L (ref 0–37)
Albumin: 3.7 g/dL (ref 3.5–5.2)
Alkaline Phosphatase: 136 U/L — ABNORMAL HIGH (ref 39–117)
Bilirubin, Direct: 0.1 mg/dL (ref 0.0–0.3)
Total Bilirubin: 0.5 mg/dL (ref 0.2–1.2)
Total Protein: 8.4 g/dL — ABNORMAL HIGH (ref 6.0–8.3)

## 2021-11-22 NOTE — Progress Notes (Signed)
OV 06/19/2020  Subjective:  Patient ID: Ashley Henry, female , DOB: [birth year] , age 45 y.o. , MRN: [id number] , ADDRESS: [address] [place] [address]  PCP Eulas Post, MD Attending : Treatment Team:  Attending Provider: Brand Males, MD    06/19/2020 -   Chief Complaint  Patient presents with   Consult    Patient is here for a productive/dry cough that she has had for several months with brown/yellow thivk sputum. Shortness of breath with exertion, stong smells, to hot.      HPI Ashley Henry 46 y.o. -referred by primary care physician Burchette, Alinda Sierras, MD.  Patient is a psychotherapist.  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  Her 3 maternal uncles, her brother and her maternal aunt all have sarcoidosis.  Her uncles are deceased although from other reasons.  Her brother and her aunt are still living.  She tells me that in the last 6 months he has had insidious onset of chronic cough, shortness of breath, night sweats and chills.  These have persisted.  This is despite having an intentional 8 pound weight loss during this time.  She says cough is made worse by talking or exertion.  Similarly shortness of breath is made worse by exertion.  Also made worse by talking through the mask during counseling sessions.  Both are relieved by rest.  Cough is also relieved by not talking.  She does have nocturnal awakening with cough chest tightness and wheezing.  She also reports right infra axillary right upper quadrant pain with coughing.  She does feel chest tightness deep inside the chest.  She had CT scan of the chest that I personally visualized.  Shows mediastinal lymphadenopathy consistent with stage I sarcoidosis.  CT scan abdomen cuts on the chest CT show possible evidence of sarcoidosis in the liver.  MRI has been recommended.  There are associated palpitations and night sweats.  Cough is associated with sputum productin     IMPRESSION:  1.  Pulmonary parenchymal and thoracic nodal findings which are consistent with sarcoidosis. 2. Heterogeneous density throughout the liver. Possibly heterogeneous steatosis. Hepatic involvement of sarcoidosis could look similar. If there are right upper quadrant symptoms or abnormal liver function test, pre and post contrast abdominal MRI should be considered.     Electronically Signed   By: Abigail Miyamoto M.D.   On: 05/29/2020 10:59    Dr Lorenza Cambridge Reflux Symptom Index (> 13-15 suggestive of LPR cough) 0 -> 5  =  none ->severe problem  Hoarseness of problem with voice 3  Clearing  Of Throat 4  Excess throat mucus or feeling of post nasal drip 4  Difficulty swallowing food, liquid or tablets 0  Cough after eating or lying down 3  Breathing difficulties or choking episodes 3  Troublesome or annoying cough 5  Sensation of something sticking in throat or lump in throat 5  Heartburn, chest pain, indigestion, or stomach acid coming up 0  TOTAL _0 ROS - per HPI  Results for Ashley, Henry (MRN [id number]) as of 06/19/2020 09:03  Ref. Range 09/26/2017 14:27 10/13/2017 16:54 10/06/2018 14:47  Anti Nuclear Antibody (ANA) Latest Ref Range: NEGATIVE   NEGATIVE   SSA (Ro) (ENA) Antibody, IgG Latest Ref Range: <1.0 NEG AI <1.0 NEG    SSB (La) (ENA) Antibody, IgG Latest Ref Range: <1.0 NEG AI <1.0 NEG  07/13/2020 Follow up : Cough/? Sarcoid /Abnormal CT chest and MRI AB   46 year old female never smoker seen for pulmonary consult June 19, 2020 for 59-monthhistory of chronic cough shortness of breath night sweats and abnormal CT chest suspicious for sarcoidosis.  Patient has a very strong family history of sarcoidosis.  Patient presents for a 3-week follow-up.  Patient was seen last visit for pulmonary consult for a 69-monthistory of cough shortness of breath night sweats and chills.  She was seen by her primary care provider.  Subsequent CT chest May 29, 2020 showed pulmonary  nodularity, possible bilateral bilateral hilar adenopathy, liver abnormalities.  This was suspicious for possible sarcoidosis.  Lab work last visit showed an elevated ACE level at 82 and a high sed rate at greater than 130.  Her rheumatoid factor was positive but CCP was negative.  INR was slightly elevated at 1.1.  Patient anemia was stable with hemoglobin at 9.3.  Her platelets were normal.  Hepatic function testing was normal.  Patient was set up for an MRI abdomen that showed abnormalities with a hyperenhancing area in the medial segment of the left hepatic lobe hepatic nodularity.  And splenic lesions.  There was suspicion for portal hypertension with possible varices versus a distal esophageal hyperenhancement.  There was no signs of steatosis. Patient has significant fatigue low energy decreased activity tolerance cough joint pains.  She has had intermittent rashes but has none currently.  She has had a recent eye exam with no reported abnormalities. We discussed all of her test and lab results. Has very strong family history of sarcoidosis with 3 maternal uncles, her brother and a maternal aunt that all have sarcoidosis. Patient has had intentional weight loss says that she has been on a diet and is down greater than 89 pounds. She denies any hemoptysis bloody stools hematemesis, bruising.  We had a long discussion regarding her abnormalities in her lab work and recent MRI of her liver and spleen.  We discussed the need to proceed with probable bronchoscopy with biopsy and referral to GI for consideration for further work-up of liver involvement and abnormalities. Unfortunately patient's medical insurance is going to stop July 16, 2020.  She declines flu shot and Covid vaccines  Patient works independently as a psManagement consultant   TEST/EVENTS :  Chest x-ray September 29, 2017 clear lungs no acute process  Renal CT April 2017-no focal abnormalities noted in the liver, spleen or pancreas,  nonobstructive left nephrolithiasis, moderate right hydroureter nephrosis  CT chest without contrast May 29, 2020 showed perilymphatic, peribronchovascular and subpleural distribution of pulmonary nodularity throughout right apical nodule 5 mm and nodularity along the left major fissure measuring 9 mm.  Heterogeneous density throughout the liver.,  Possible mild bilateral hilar adenopathy  MR abdomen July 08, 2020 hyperenhancing area in the medial segment of the left hepatic lobe, background of nodular hepatic contours and hepatic heterogeneity atypical for sarcoid associated lesion given arterial enhancement other areas more typical for sarcoid but remain nonspecific.,  Splenic lesions noted.  Potential portal hypertension with varices versus distal esophageal hyperenhancement, no signs of steatosis  Labs June 19, 2020: QuantiFERON gold negative, Sjogren's negative, LFTs normal, rheumatoid factor positive, CCP negative, INR 1.1, allergy profile negative, IgE 67, Anti-DNA antibody negative, hepatic panel normal, platelets normal, WBC 7.1 ACE level elevated 82, sed rate greater than 130, vitamin D 8, hemoglobin 9.3 hematocrit 28  2D echo July 07, 2020 EF 55 to 60%, mild LVH, pulmonary artery  pressure normal  Previous iron levels were noted low  HIV 2019 neg.   OV 11/22/2021  Subjective:  Patient ID: Ashley Henry, female , DOB: 03-31-1976 , age 89 y.o. , MRN: [id number] , ADDRESS: [address] [place] [address] PCP Eulas Post, MD Patient Care Team: Eulas Post, MD as PCP - General  This Provider for this visit: Treatment Team:  Attending Provider: Brand Males, MD    11/22/2021 -   Chief Complaint  Patient presents with   Follow-up    Pt states she is coughing a lot each day and also states that her skin has been breaking out. States first thing in the morning she will cough up a lot of phlegm. States that she does have increased SOB  especially when exercising.     ICD-10-CM   1. Sarcoidosis  D86.9     2. Chronic cough  R05.3     3. Dyspnea on exertion  R06.09     4. Mediastinal adenopathy  R59.0     5. Vitamin D deficiency  E55.9     6. Anemia, unspecified type  D64.9     7. Rheumatoid factor positive  R76.8     8. Abnormal CT of liver  R93.2     9. Pigmented skin lesions  L81.9        HPI Ashley Henry 46 y.o. -returns for follow-up.  Last seen personally in 2021.  At that time the concern was all his symptoms are pointing toward sarcoidosis.  She then saw a nurse practitioner Patricia Nettle in October 2021.  She was confirmed to have sarcoidosis clinically based on high angiotensin-converting enzyme, African-American ethnicity, age and symptoms and CT scan findings of the lung.  She was then prescribed prednisone for 3 months she says during this time all the symptoms resolved.  She did not have insurance so did not do any further work-up.  She says then early last year she ran out of prednisone and since then having significant symptoms.  The symptoms include significant amount of night sweats, shortness of breath and wheezing.  Also severe cough day and night.  In the early morning the cough is productive with yellow sputum but rest of the day the cough is dry.  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  She showed a voice recording of the cough and there is clear laryngeal component to the cough.  Symptoms are rated as severe.  Cough not responding to Tessalon cough.  During this time she had skin lesions and arthralgia.  The skin lesions on the hands forehead.  There is also drying of the palms.  There is arthralgia diffusely.  Vitamin D deficiency: This was diagnosed in 2021.  Not on treatment currently  Anemia: This was diagnosed in 2021.  Not on treatment currently   Liver lesion seen on MRI along with splenic lesions: These were reported as atypical for sarcoid.  She  never saw a GI  Skin lesions: This is new since last visit.  She is open to seeing dermatologist.    CT Chest data  No results found.    PFT  No flowsheet data found.     has a past medical history of Anemia, HYPERLIPIDEMIA (10/11/2009), Hypertension (2010), Kidney stones, Pre-diabetes, and Renal disorder.   reports that she has never smoked. She has never used smokeless tobacco.  Past Surgical History:  Procedure Laterality Date  BREAST REDUCTION SURGERY  1999   CESAREAN SECTION  2004   Cysts removed from chest wall     EYE SURGERY Left    MASS EXCISION N/A 04/14/2018   Procedure: EXCISION OF CHEST WALL MASS;  Surgeon: Coralie Keens, MD;  Location: [facility];  Service: General;  Laterality: N/A;    Allergies  Allergen Reactions   Multihance [Gadobenate] Nausea And Vomiting   Amlodipine Nausea Only   Collagenase Hives   Collagenase Clostridium Histolyticum Hives   Aspirin Rash    Immunization History  Administered Date(s) Administered   PFIZER(Purple Top)SARS-COV-2 Vaccination 12/30/2020, 01/20/2021, 06/30/2021   PPD Test 03/29/2020   Td 09/04/2009   Tdap 09/04/2009    Family History  Problem Relation Age of Onset   Hypertension Mother    Diabetes Mother    Hypertension Father    Stomach cancer Paternal Grandmother    Stomach cancer Maternal Uncle    Heart disease Maternal Uncle    Lung cancer Maternal Grandfather    Prostate cancer Maternal Uncle    Thyroid disease Maternal Uncle      Current Outpatient Medications:    albuterol (PROVENTIL) (2.5 MG/3ML) 0.083% nebulizer solution, Take 3 mLs (2.5 mg total) by nebulization every 6 (six) hours as needed for wheezing or shortness of breath., Disp: 75 mL, Rfl: 5   albuterol (VENTOLIN HFA) 108 (90 Base) MCG/ACT inhaler, INHALE 2 PUFFS INTO THE LUNGS EVERY 6 HOURS AS NEEDED FOR WHEEZING OR SHORTNESS OF BREATH, Disp: 18 g, Rfl: 2   benzonatate (TESSALON) 100 MG capsule, TAKE 1 CAPSULE(100  MG) BY MOUTH THREE TIMES DAILY AS NEEDED FOR COUGH, Disp: 40 capsule, Rfl: 0   BEPREVE 1.5 % SOLN, Place 1 drop into both eyes daily., Disp: 10 mL, Rfl: 5   cyclobenzaprine (FLEXERIL) 5 MG tablet, TAKE 1 TO 2 TABLETS BY MOUTH EVERY NIGHT AS NEEDED FOR MUSCLE SPASMS, Disp: 60 tablet, Rfl: 3   fluticasone (CUTIVATE) 0.05 % cream, Apply topically 2 (two) times daily., Disp: 15 g, Rfl: 0   Norgestimate-Ethinyl Estradiol Triphasic (TRI-LO-SPRINTEC) 0.18/0.215/0.25 MG-25 MCG tab, Take 1 tablet by mouth daily., Disp: 28 tablet, Rfl: 11   omeprazole (PRILOSEC) 40 MG capsule, TAKE 1 CAPSULE(40 MG) BY MOUTH DAILY, Disp: 30 capsule, Rfl: 3      Objective:   Vitals:   11/22/21 0923  BP: 130/80  Pulse: (!) 110  Temp: 98.4 F (36.9 C)  TempSrc: Oral  SpO2: 98%  Weight: 246 lb 9.6 oz (111.9 kg)  Height: _0  (1.626 m)    Estimated body mass index is 42.33 kg/m as calculated from the following:   Height as of this encounter: _1  (1.626 m).   Weight as of this encounter: 246 lb 9.6 oz (111.9 kg).  _2 @  [facility]   11/22/21 0923  Weight: 246 lb 9.6 oz (111.9 kg)     Physical Exam    General: No distress. obese Neuro: Alert and Oriented x 3. GCS 15. Speech normal Psych: Pleasant Resp:  Barrel Chest - no.  Wheeze - no, Crackles - no, No overt respiratory distress CVS: Normal heart sounds. Murmurs - no Ext: Stigmata of Connective Tissue Disease - no HEENT: Normal upper airway. PEERL +. No post nasal drip SKIN: Pigmented scattered macular lesions forehead and also hands.       Assessment:       ICD-10-CM   1. Sarcoidosis  D86.9     2. Chronic cough  R05.3  3. Dyspnea on exertion  R06.09     4. Mediastinal adenopathy  R59.0     5. Vitamin D deficiency  E55.9     6. Anemia, unspecified type  D64.9     7. Rheumatoid factor positive  R76.8     8. Abnormal CT of liver  R93.2     9. Pigmented skin lesions  L81.9          Plan:     Patient  Instructions  Sarcoidosis Chronic cough Dyspnea on exertion Mediastinal adenopathy   -Highly concerning that sarcoidosis is extremely active -Some element of cough can be because of cough neuropathy from active voice use  Plan  - Do full pulmonary function test - Do CT scan of the chest with contrast -Do blood work for ESR, angiotensin-converting enzyme 11/22/2021 - do blood quant gold 11/22/2021    Vitamin D deficiency-severe in 2021  Plan -Check blood vitamin D   Anemia, unspecified type -noted in October 2021  Plan - Check CBC with differential, chemistry, LFT iron panel 11/22/2021   Rheumatoid factor positive -mildly positive in 2021  Plan  - Check ANA, rheumatoid factor and CCP 11/22/2021   Abnormal CT of liver along with MRI of the liver and spleen in 2021  Plan - Refer to gastroenterology  Pigmented skin lesions  -Very suspicious of sarcoidosis  Plan - Refer to dermatology  Follow-up - Return in the next few to several weeks to see Dr. Chase Caller or nurse practitioner Patricia Nettle  -We will discuss committing to steroids at follow-up and visiting with Dr. Rodman Pickle or resident sarcoid expert for second opinion/comanagement    SIGNATURE    Dr. Brand Males, M.D., F.C.C.P,  Pulmonary and Critical Care Medicine Staff Physician, [place] Director - Interstitial Lung Disease  Program  [facility] [place], [place], [address]  Pager: [phone], If no answer or between  15:00h - 7:00h: call [phone] Telephone: [phone]  9:51 AM 11/22/2021

## 2021-11-22 NOTE — Patient Instructions (Addendum)
Sarcoidosis ?Chronic cough ?Dyspnea on exertion ?Mediastinal adenopathy ? ? -Highly concerning that sarcoidosis is extremely active ?-Some element of cough can be because of cough neuropathy from active voice use ? ?Plan ? - Do full pulmonary function test ?- Do CT scan of the chest with contrast ?-Do blood work for ESR, angiotensin-converting enzyme 11/22/2021 ?- do blood quant gold 11/22/2021 ? ? ? ?Vitamin D deficiency-severe in 2021 ? ?Plan ?-Check blood vitamin D ? ? ?Anemia, unspecified type -noted in October 2021 ? ?Plan ?- Check CBC with differential, chemistry, LFT iron panel 11/22/2021 ? ? ?Rheumatoid factor positive -mildly positive in 2021 ? ?Plan ? - Check ANA, rheumatoid factor and CCP 11/22/2021 ? ? ?Abnormal CT of liver along with MRI of the liver and spleen in 2021 ? ?Plan ?- Refer to gastroenterology ? ?Pigmented skin lesions ? ?-Very suspicious of sarcoidosis ? ?Plan ?- Refer to dermatology ? ?Follow-up ?- Return in the next few to several weeks to see Dr. Chase Caller or nurse practitioner Patricia Nettle ? -We will discuss committing to steroids at follow-up and visiting with Dr. Rodman Pickle or resident sarcoid expert for second opinion/comanagement ?

## 2021-11-23 ENCOUNTER — Other Ambulatory Visit: Payer: Self-pay | Admitting: Family Medicine

## 2021-11-23 NOTE — Telephone Encounter (Signed)
Last VV- 04/25/2021 ?Last refill- 10/26/2021 ? ?No future OV scheduled.   Can this patient receive a refill? ?

## 2021-11-27 LAB — QUANTIFERON-TB GOLD PLUS
Mitogen-NIL: 2.56 IU/mL
NIL: 0.11 IU/mL
QuantiFERON-TB Gold Plus: NEGATIVE
TB1-NIL: 0 IU/mL
TB2-NIL: 0 IU/mL

## 2021-11-27 LAB — IRON,TIBC AND FERRITIN PANEL
%SAT: 6 % (calc) — ABNORMAL LOW (ref 16–45)
Ferritin: 19 ng/mL (ref 16–232)
Iron: 24 ug/dL — ABNORMAL LOW (ref 40–190)
TIBC: 434 mcg/dL (calc) (ref 250–450)

## 2021-11-27 LAB — VITAMIN D 1,25 DIHYDROXY
Vitamin D 1, 25 (OH)2 Total: 75 pg/mL — ABNORMAL HIGH (ref 18–72)
Vitamin D2 1, 25 (OH)2: <8 pg/mL
Vitamin D3 1, 25 (OH)2: 75 pg/mL

## 2021-11-27 LAB — RHEUMATOID FACTOR: Rheumatoid fact SerPl-aCnc: 24 IU/mL — ABNORMAL HIGH (ref <14)

## 2021-11-27 LAB — ANGIOTENSIN CONVERTING ENZYME: Angiotensin-Converting Enzyme: 185 U/L — ABNORMAL HIGH (ref 9–67)

## 2021-11-27 LAB — ANA: Anti Nuclear Antibody (ANA): NEGATIVE

## 2021-11-27 LAB — CYCLIC CITRUL PEPTIDE ANTIBODY, IGG: Cyclic Citrullin Peptide Ab: <16 UNITS

## 2021-12-03 ENCOUNTER — Other Ambulatory Visit: Payer: Self-pay | Admitting: Family Medicine

## 2021-12-07 ENCOUNTER — Other Ambulatory Visit: Payer: Self-pay

## 2021-12-07 ENCOUNTER — Encounter: Payer: Self-pay | Admitting: Family Medicine

## 2021-12-07 MED ORDER — OMEPRAZOLE 40 MG PO CPDR: DELAYED_RELEASE_CAPSULE | ORAL | 2 refills | Status: DC

## 2021-12-11 ENCOUNTER — Ambulatory Visit (INDEPENDENT_AMBULATORY_CARE_PROVIDER_SITE_OTHER)
Admission: RE | Admit: 2021-12-11 | Discharge: 2021-12-11 | Disposition: A | Discharge status: Home / Self Care | Payer: 59 | Source: Ambulatory Visit | Attending: Internal Medicine | Admitting: Internal Medicine

## 2021-12-11 ENCOUNTER — Other Ambulatory Visit: Payer: Self-pay

## 2021-12-11 DIAGNOSIS — D869 Sarcoidosis, unspecified: Secondary | ICD-10-CM | POA: Diagnosis not present

## 2021-12-11 MED ORDER — IOHEXOL 300 MG/ML  SOLN
80.0000 mL | Freq: Once | INTRAMUSCULAR | Status: AC | PRN
  Administered 2021-12-11: 80 mL via INTRAVENOUS

## 2021-12-12 ENCOUNTER — Telehealth (INDEPENDENT_AMBULATORY_CARE_PROVIDER_SITE_OTHER): Payer: 59 | Admitting: Family Medicine

## 2021-12-12 ENCOUNTER — Encounter: Payer: Self-pay | Admitting: Family Medicine

## 2021-12-12 VITALS — Ht 64.0 in | Wt 246.6 lb

## 2021-12-12 DIAGNOSIS — R0600 Dyspnea, unspecified: Secondary | ICD-10-CM

## 2021-12-12 DIAGNOSIS — D509 Iron deficiency anemia, unspecified: Secondary | ICD-10-CM | POA: Diagnosis not present

## 2021-12-12 DIAGNOSIS — R059 Cough, unspecified: Secondary | ICD-10-CM

## 2021-12-12 MED ORDER — PREDNISONE 20 MG PO TABS: ORAL_TABLET | ORAL | 0 refills | Status: DC

## 2021-12-12 NOTE — Progress Notes (Signed)
Patient ID: Ashley Henry, female   DOB: 1976/08/21, 46 y.o.   MRN: [id number] ? ? ?This visit type was conducted due to national recommendations for restrictions regarding the COVID-19 pandemic in an effort to limit this patient's exposure and mitigate transmission in our community.  ? ?Virtual Visit via Video Note ? ?I connected with Ashley Henry on 12/12/21 at  5:30 PM EDT by a video enabled telemedicine application and verified that I am speaking with the correct person using two identifiers. ? Location patient: home ?Location provider:work or home office ?Persons participating in the virtual visit: patient, provider ? ?I discussed the limitations of evaluation and management by telemedicine and the availability of in person appointments. The patient expressed understanding and agreed to proceed. ? ? ?HPI: ? ?Patient has history of hypertension, sarcoidosis, kidney stones, vitamin D deficiency, iron deficiency anemia.  She called basically with questions regarding some recent labs that were done through pulmonary on 11-22-2021.  She also had CT scan along done earlier today.  She developed some chronic cough back in 2021.  We referred her to pulmonary and diagnosis of likely sarcoidosis.  She was placed on prednisone and did promptly improved. ? ?She has recently had some increased cough and shortness of breath.  She is still trying to exercise daily but has had to curtail her usual amount of exercise. ? ?Denies any recent fever.  She had multiple recent labs and these were reviewed with patient.  Antinuclear antibody negative.  Rheumatoid factor was slightly elevated with negative CCP antibody.  She had low serum iron.  Ferritin was 19.  Hemoglobin 9.8 which is near her baseline.  ACE level 185.  Sed rate greater than 130. ? ?CT lung showed dramatic increase in parenchymal lung involvement with extensive progressive perilymphatic nodularity compatible with likely worsening sarcoidosis.  Multiple large  low-attenuation splenic lesions likely reflective of sarcoid involvement.  She also had some dilation of the pulmonic trunk concerning for pulmonary arterial hypertension. ? ? ?ROS: See pertinent positives and negatives per HPI. ? ?Past Medical History:  ?Diagnosis Date  ? Anemia   ? HYPERLIPIDEMIA 10/11/2009  ? Hypertension 2010  ? no meds now  ? Kidney stones   ? Pre-diabetes   ? HgbA1c 6.2 on 09-30-17  ? Renal disorder   ? ? ?Past Surgical History:  ?Procedure Laterality Date  ? BREAST REDUCTION SURGERY  1999  ? CESAREAN SECTION  2004  ? Cysts removed from chest wall    ? EYE SURGERY Left   ? MASS EXCISION N/A 04/14/2018  ? Procedure: EXCISION OF CHEST WALL MASS;  Surgeon: Coralie Keens, MD;  Location: [facility];  Service: General;  Laterality: N/A;  ? ? ?Family History  ?Problem Relation Age of Onset  ? Hypertension Mother   ? Diabetes Mother   ? Hypertension Father   ? Stomach cancer Paternal Grandmother   ? Stomach cancer Maternal Uncle   ? Heart disease Maternal Uncle   ? Lung cancer Maternal Grandfather   ? Prostate cancer Maternal Uncle   ? Thyroid disease Maternal Uncle   ? ? ?SOCIAL HX: Non-smoker ? ? ?Current Outpatient Medications:  ?  albuterol (PROVENTIL) (2.5 MG/3ML) 0.083% nebulizer solution, Take 3 mLs (2.5 mg total) by nebulization every 6 (six) hours as needed for wheezing or shortness of breath., Disp: 75 mL, Rfl: 5 ?  albuterol (VENTOLIN HFA) 108 (90 Base) MCG/ACT inhaler, INHALE 2 PUFFS INTO THE LUNGS EVERY 6 HOURS AS NEEDED FOR WHEEZING  OR SHORTNESS OF BREATH, Disp: 18 g, Rfl: 2 ?  benzonatate (TESSALON) 100 MG capsule, TAKE 1 CAPSULE(100 MG) BY MOUTH THREE TIMES DAILY AS NEEDED FOR COUGH, Disp: 40 capsule, Rfl: 2 ?  BEPREVE 1.5 % SOLN, Place 1 drop into both eyes daily., Disp: 10 mL, Rfl: 5 ?  cyclobenzaprine (FLEXERIL) 5 MG tablet, TAKE 1 TO 2 TABLETS BY MOUTH EVERY NIGHT AS NEEDED FOR MUSCLE SPASMS, Disp: 60 tablet, Rfl: 3 ?  fluticasone (CUTIVATE) 0.05 % cream, Apply  topically 2 (two) times daily., Disp: 15 g, Rfl: 0 ?  Norgestimate-Ethinyl Estradiol Triphasic (TRI-LO-SPRINTEC) 0.18/0.215/0.25 MG-25 MCG tab, Take 1 tablet by mouth daily., Disp: 28 tablet, Rfl: 11 ?  omeprazole (PRILOSEC) 40 MG capsule, TAKE 1 CAPSULE(40 MG) BY MOUTH DAILY, Disp: 30 capsule, Rfl: 2 ?  predniSONE (DELTASONE) 20 MG tablet, Take 2 tablets by mouth once daily for 6 days, Disp: 12 tablet, Rfl: 0 ? ?EXAM: ? ?VITALS per patient if applicable: ? ?GENERAL: alert, oriented, appears well and in no acute distress ? ?HEENT: atraumatic, conjunttiva clear, no obvious abnormalities on inspection of external nose and ears ? ?NECK: normal movements of the head and neck ? ?LUNGS: on inspection no signs of respiratory distress, breathing rate appears normal, no obvious gross SOB, gasping or wheezing ? ?CV: no obvious cyanosis ? ?MS: moves all visible extremities without noticeable abnormality ? ?PSYCH/NEURO: pleasant and cooperative, no obvious depression or anxiety, speech and thought processing grossly intact ? ?ASSESSMENT AND PLAN: ? ?Discussed the following assessment and plan: ? ?Sarcoidosis history.  Patient has had some recent increased cough and shortness of breath.  Recent CAT scan as above concerning for progression in her sarcoidosis.  Multiple lab abnormalities as above.  We explained that mildly elevated rheumatoid factor may likely be related to her sarcoid and is nonspecific but that more specific CCP antibody was negative.  She does have very high sed rate and elevated ACE level.  Chronic iron deficiency.  She does menstruate regularly.  She states she had GI work-up previously but could not locate colonoscopy or EGD. ? ?-We have advised that she follow-up closely with pulmonary regarding above abnormalities. ?-Suggested she start over-the-counter iron replacement if tolerated ?-Patient basically was requesting prednisone for her acute wheezing.  We explained that any prolonged taper with regard to  her sarcoid would need to be managed by pulmonary.  We did agree to send in prednisone 20 mg 2 tablets daily for 6 days until she can follow-up with pulmonary.  She does not have scheduled office follow-up until May. ?-consider repeat CBC, iron studies after 6-8 weeks on iron.   ? ? ?  ?I discussed the assessment and treatment plan with the patient. The patient was provided an opportunity to ask questions and all were answered. The patient agreed with the plan and demonstrated an understanding of the instructions. ?  ?The patient was advised to call back or seek an in-person evaluation if the symptoms worsen or if the condition fails to improve as anticipated. ? ? ? ? ?Carolann Littler, MD  ? ?

## 2021-12-13 ENCOUNTER — Other Ambulatory Visit: Payer: 59

## 2021-12-16 ENCOUNTER — Encounter: Payer: Self-pay | Admitting: Family Medicine

## 2021-12-16 ENCOUNTER — Encounter: Payer: Self-pay | Admitting: Internal Medicine

## 2021-12-17 NOTE — Telephone Encounter (Signed)
Altenrative appt with APP shoould have been considered. Not sure what happened. Nevertheless, please give her ViDEO visit this week with me . Options 4.30pm 12/18/21 Tuesday Or 4.30pm 12/20/21 Thu. In review of Dr Eulas Post, MD notes yes I need to discuss Rx with her and she is symptomatic.  ? ?

## 2021-12-17 NOTE — Telephone Encounter (Signed)
Mychart message sent by pt: ?[facility]ley [facility] (supporting Brand Males, MD) 17 hours ago (8:59 PM)  ? ?Good Evening, ?  ?I saw you for a follow up in March and a CT scan as well as some blood tests were completed. My original follow up appointment with you was rescheduled by your office due to a schedule change that you had in your schedule. After speaking with my PCP, Dr. Carolann Littler, and discussing the results of my CT and blood work, I am very concerned that the sarcoidosis is very active at this time. My PCP prescribed 6 days of Prednisone and stated that he sent you a message about following up with my care sooner than the May appointment that is scheduled. I would like to know if I can schedule a virtual appointment with you or if you can send in a medication request for some Prednisone until I can be seen again because my symptoms are worsening and need to be treated as soon as possible since they are affecting my daily living functions. Thank you. ? ? ? ? ?MR, please advise. ?

## 2021-12-18 ENCOUNTER — Telehealth (INDEPENDENT_AMBULATORY_CARE_PROVIDER_SITE_OTHER): Payer: 59 | Admitting: Internal Medicine

## 2021-12-18 ENCOUNTER — Encounter: Payer: Self-pay | Admitting: Internal Medicine

## 2021-12-18 DIAGNOSIS — R768 Other specified abnormal immunological findings in serum: Secondary | ICD-10-CM

## 2021-12-18 DIAGNOSIS — D869 Sarcoidosis, unspecified: Secondary | ICD-10-CM | POA: Diagnosis not present

## 2021-12-18 DIAGNOSIS — R002 Palpitations: Secondary | ICD-10-CM | POA: Diagnosis not present

## 2021-12-18 MED ORDER — PREDNISONE 10 MG PO TABS
ORAL_TABLET | ORAL | 3 refills | Status: AC
Start: 2021-12-18 — End: 2022-01-15

## 2021-12-18 NOTE — Telephone Encounter (Signed)
I called and LMTCB for the pt.  ?

## 2021-12-18 NOTE — Progress Notes (Signed)
? ? ? ?OV 06/19/2020 ? ?Subjective:  ?Patient ID: Ashley Henry, female , DOB: 10-02-1975 , age 46 y.o. , MRN: [id number] , ADDRESS: [address] ?[place] [address]  ?PCP Burchette, Alinda Sierras, MD ?Attending : Treatment Team:  ?Attending Provider: Brand Males, MD ? ? ? ?06/19/2020 -   ?Chief Complaint  ?Patient presents with  ? Consult  ?  Patient is here for a productive/dry cough that she has had for several months with brown/yellow thivk sputum. Shortness of breath with exertion, stong smells, to hot.   ? ? ? ?HPI ?Ashley Henry 46 y.o. -referred by primary care physician Burchette, Alinda Sierras, MD.  Patient is a psychotherapist.  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  Her 3 maternal uncles, her brother and her maternal aunt all have sarcoidosis.  Her uncles are deceased although from other reasons.  Her brother and her aunt are still living.  She tells me that in the last 6 months he has had insidious onset of chronic cough, shortness of breath, night sweats and chills.  These have persisted.  This is despite having an intentional 8 pound weight loss during this time.  She says cough is made worse by talking or exertion.  Similarly shortness of breath is made worse by exertion.  Also made worse by talking through the mask during counseling sessions.  Both are relieved by rest.  Cough is also relieved by not talking.  She does have nocturnal awakening with cough chest tightness and wheezing.  She also reports right infra axillary right upper quadrant pain with coughing.  She does feel chest tightness deep inside the chest.  She had CT scan of the chest that I personally visualized.  Shows mediastinal lymphadenopathy consistent with stage I sarcoidosis.  CT scan abdomen cuts on the chest CT show possible evidence of sarcoidosis in the liver.  MRI has been recommended.  There are associated palpitations and night sweats.  Cough is associated with sputum productin ? ? ? ? ?IMPRESSION:  ?1.  Pulmonary parenchymal and thoracic nodal findings which are ?consistent with sarcoidosis. ?2. Heterogeneous density throughout the liver. Possibly ?heterogeneous steatosis. Hepatic involvement of sarcoidosis could ?look similar. If there are right upper quadrant symptoms or abnormal ?liver function test, pre and post contrast abdominal MRI should be ?considered. ?  ?  ?Electronically Signed ?  By: Ashley Henry M.D. ?  On: 05/29/2020 10:59 ? ? ? ?Dr Ashley Henry Reflux Symptom Index (> 13-15 suggestive of LPR cough) 0 -> 5  =  none ->severe problem  ?Hoarseness of problem with voice 3  ?Clearing  Of Throat 4  ?Excess throat mucus or feeling of post nasal drip 4  ?Difficulty swallowing food, liquid or tablets 0  ?Cough after eating or lying down 3  ?Breathing difficulties or choking episodes 3  ?Troublesome or annoying cough 5  ?Sensation of something sticking in throat or lump in throat 5  ?Heartburn, chest pain, indigestion, or stomach acid coming up 0  ?TOTAL 27  ? ?3 ?4 ?ROS ?- per HPI ? ?Results for Ashley, Henry (MRN [id number]) as of 06/19/2020 09:03 ? Ref. Range 09/26/2017 14:27 10/13/2017 16:54 10/06/2018 14:47  ?Anti Nuclear Antibody (ANA) Latest Ref Range: NEGATIVE   NEGATIVE   ?SSA (Ro) (ENA) Antibody, IgG Latest Ref Range: <1.0 NEG AI <1.0 NEG    ?SSB (La) (ENA) Antibody, IgG Latest Ref Range: <1.0 NEG AI <1.0 NEG    ? ? ? ?  07/13/2020 Follow up : Cough/? Sarcoid /Abnormal CT chest and MRI AB  ? ?46 year old female never smoker seen for pulmonary consult June 19, 2020 for 81-monthhistory of chronic cough shortness of breath night sweats and abnormal CT chest suspicious for sarcoidosis.  Patient has a very strong family history of sarcoidosis. ? ?Patient presents for a 3-week follow-up.  Patient was seen last visit for pulmonary consult for a 615-monthistory of cough shortness of breath night sweats and chills.  She was seen by her primary care provider.  Subsequent CT chest May 29, 2020 showed pulmonary  nodularity, possible bilateral bilateral hilar adenopathy, liver abnormalities.  This was suspicious for possible sarcoidosis.  Lab work last visit showed an elevated ACE level at 82 and a high sed rate at greater than 130.  Her rheumatoid factor was positive but CCP was negative.  INR was slightly elevated at 1.1.  Patient anemia was stable with hemoglobin at 9.3.  Her platelets were normal.  Hepatic function testing was normal.  Patient was set up for an MRI abdomen that showed abnormalities with a hyperenhancing area in the medial segment of the left hepatic lobe hepatic nodularity.  And splenic lesions.  There was suspicion for portal hypertension with possible varices versus a distal esophageal hyperenhancement.  There was no signs of steatosis. ?Patient has significant fatigue low energy decreased activity tolerance cough joint pains.  She has had intermittent rashes but has none currently.  She has had a recent eye exam with no reported abnormalities. ?We discussed all of her test and lab results. ?Has very strong family history of sarcoidosis with 3 maternal uncles, her brother and a maternal aunt that all have sarcoidosis. ?Patient has had intentional weight loss says that she has been on a diet and is down greater than 89 pounds. ?She denies any hemoptysis bloody stools hematemesis, bruising. ? ?We had a long discussion regarding her abnormalities in her lab work and recent MRI of her liver and spleen.  We discussed the need to proceed with probable bronchoscopy with biopsy and referral to GI for consideration for further work-up of liver involvement and abnormalities. ?Unfortunately patient's medical insurance is going to stop July 16, 2020. ? ?She declines flu shot and Covid vaccines ? ?Patient works independently as a psManagement consultant? ? ? ?TEST/EVENTS :  ?Chest x-ray September 29, 2017 clear lungs no acute process ? ?Renal CT April 2017-no focal abnormalities noted in the liver, spleen or pancreas,  nonobstructive left nephrolithiasis, moderate right hydroureter nephrosis ? ?CT chest without contrast May 29, 2020 showed perilymphatic, peribronchovascular and subpleural distribution of pulmonary nodularity throughout right apical nodule 5 mm and nodularity along the left major fissure measuring 9 mm.  Heterogeneous density throughout the liver.,  Possible mild bilateral hilar adenopathy ? ?MR abdomen July 08, 2020 hyperenhancing area in the medial segment of the left hepatic lobe, background of nodular hepatic contours and hepatic heterogeneity atypical for sarcoid associated lesion given arterial enhancement other areas more typical for sarcoid but remain nonspecific.,  Splenic lesions noted.  Potential portal hypertension with varices versus distal esophageal hyperenhancement, no signs of steatosis ? ?Labs June 19, 2020: QuantiFERON gold negative, Sjogren's negative, LFTs normal, rheumatoid factor positive, CCP negative, INR 1.1, allergy profile negative, IgE 67, ?Anti-DNA antibody negative, hepatic panel normal, platelets normal, WBC 7.1 ?ACE level elevated 82, sed rate greater than 130, vitamin D 8, hemoglobin 9.3 hematocrit 28 ? ?2D echo July 07, 2020 EF 55 to 60%, mild LVH, pulmonary artery  pressure normal ? ?Previous iron levels were noted low ? ?HIV 2019 neg.  ? ?OV 11/22/2021 ? ?Subjective:  ?Patient ID: Ashley Henry, female , DOB: [birth year] , age 39 y.o. , MRN: [id number] , ADDRESS: [address] ?[place] [address] ?PCP Burchette, Alinda Sierras, MD ?Patient Care Team: ?Eulas Post, MD as PCP - General ? ?This Provider for this visit: Treatment Team:  ?Attending Provider: Brand Males, MD ? ? ? ?11/22/2021 -   ?Chief Complaint  ?Patient presents with  ? Follow-up  ?  Pt states she is coughing a lot each day and also states that her skin has been breaking out. States first thing in the morning she will cough up a lot of phlegm. States that she does have increased SOB  especially when exercising.  ? ?  ICD-10-CM   ?1. Sarcoidosis  D86.9   ?  ?2. Chronic cough  R05.3   ?  ?3. Dyspnea on exertion  R06.09   ?  ?4. Mediastinal adenopathy  R59.0   ?  ?5. Vitamin D deficiency  E55.9

## 2021-12-18 NOTE — Patient Instructions (Addendum)
Sarcoidosis ?Chronic cough ?Dyspnea on exertion ?Mediastinal adenopathy ? ? - Features are consistent with  sarcoidosis is extremely active ?-Some element of cough can be because of cough neuropathy from active voice use ? ?Plan ? - start prednisone '40mg'$  per day x 2 weeks then '20mg'$  per day x 2 weeks and then '10mg'$  per day to continue ?- do ECHO for assessment of sarcoid ? - do PFT Jan 25, 2022 ?- do 12 lEAD ekg at followup visit Jan 28, 2022 ?- do blood work HgbA1c and g6pd blood work anytme next few weeks ?- goal symptom control as main goal and avoiding disese progression via prednisone and monitoring ? - - methotrexate 2nd line indicated for prednisone side effects or disease progression ? ? ? ?Anemia, unspecified type -noted in October 2021. Persistent on March 2023 ? ?Seems like Iron Deficiency anemia ? ?Plan ?-Please address with PCP Burchette, Alinda Sierras, MD ? ? ? ?Rheumatoid factor positive -mildly positive in 2021 and persistent though March 2023 ?Associted arthralgia ? ?- can be sarcoid arthritis versus RA ? ? ?Plan ? - refer rheumatology ? ? ?Abnormal CT of liver along with MRI of the liver and spleen in 2021 ad in CT March 2023 ? ?- GI referral placed. But understand you want to stagger visits due to your work scheduled ? ?Plan ?- Refer to gastroenterology some time in future ? ?Pigmented skin lesions ? ?-Very suspicious of sarcoidosis.  ?- But want to stagger due to schedule and want to see how prednisone works ? ?Plan ?- Refer to dermatology some poitn in future ? ?Follow-up ?- Mid May 2023 with Dr Chase Caller ?

## 2021-12-19 ENCOUNTER — Encounter: Payer: Self-pay | Admitting: Internal Medicine

## 2021-12-19 ENCOUNTER — Encounter: Payer: Self-pay | Admitting: Family Medicine

## 2021-12-19 MED ORDER — SLOW IRON 160 (50 FE) MG PO TBCR: 160.0000 mg | EXTENDED_RELEASE_TABLET | Freq: Every day | ORAL | 3 refills | Status: AC

## 2021-12-19 NOTE — Telephone Encounter (Signed)
Recommend trial of slow FE 160 mg one daily (sent in to pharmacy).    I will also try to send in other topical for [facility].   ?

## 2021-12-25 ENCOUNTER — Encounter: Payer: Self-pay | Admitting: Family Medicine

## 2021-12-28 NOTE — Telephone Encounter (Signed)
Patient daughter Ashley Henry needs replacement prescription for Locoid cream.   See message in [facility] medical record.  ?

## 2022-01-01 ENCOUNTER — Ambulatory Visit: Payer: 59 | Admitting: Internal Medicine

## 2022-01-22 ENCOUNTER — Ambulatory Visit (HOSPITAL_COMMUNITY): Payer: 59 | Attending: Cardiology

## 2022-01-22 DIAGNOSIS — R002 Palpitations: Secondary | ICD-10-CM | POA: Diagnosis not present

## 2022-01-22 DIAGNOSIS — D869 Sarcoidosis, unspecified: Secondary | ICD-10-CM | POA: Diagnosis not present

## 2022-01-22 DIAGNOSIS — I340 Nonrheumatic mitral (valve) insufficiency: Secondary | ICD-10-CM | POA: Diagnosis not present

## 2022-01-22 LAB — ECHOCARDIOGRAM COMPLETE
MV M vel: 6.7 m/s
MV Peak grad: 179.6 mmHg
Radius: 0.55 cm
S' Lateral: 3.1 cm

## 2022-01-25 ENCOUNTER — Ambulatory Visit (INDEPENDENT_AMBULATORY_CARE_PROVIDER_SITE_OTHER): Payer: 59 | Admitting: Internal Medicine

## 2022-01-25 DIAGNOSIS — D869 Sarcoidosis, unspecified: Secondary | ICD-10-CM

## 2022-01-25 NOTE — Progress Notes (Signed)
PFT done today. 

## 2022-01-28 ENCOUNTER — Encounter: Payer: Self-pay | Admitting: Internal Medicine

## 2022-01-28 ENCOUNTER — Ambulatory Visit: Payer: 59 | Admitting: Internal Medicine

## 2022-01-28 ENCOUNTER — Telehealth: Payer: Self-pay | Admitting: Internal Medicine

## 2022-01-28 VITALS — BP 128/78 | HR 111 | Temp 98.3°F | Ht 64.0 in | Wt 258.6 lb

## 2022-01-28 DIAGNOSIS — D869 Sarcoidosis, unspecified: Secondary | ICD-10-CM

## 2022-01-28 DIAGNOSIS — Z862 Personal history of diseases of the blood and blood-forming organs and certain disorders involving the immune mechanism: Secondary | ICD-10-CM | POA: Diagnosis not present

## 2022-01-28 LAB — HEMOGLOBIN A1C: Hgb A1c MFr Bld: 6.5 % (ref 4.6–6.5)

## 2022-01-28 MED ORDER — PREDNISONE 10 MG PO TABS: ORAL_TABLET | ORAL | 5 refills | Status: DC

## 2022-01-28 NOTE — Telephone Encounter (Signed)
Dear Dr Bettina Gavia ? ?Ashley Henry has active sarcoid and a Iron Def anemia. An echo May 2023 shows new osnet moderate to severe Mitrail regurg and mild aortic cdilatation. NEw since 2021. Could you please address? She might need some remote cardiach rhutym monitoring +/- MRI due to her sarcoid status ? ?THanks ? ? ? ?SIGNATURE  ? ? ?Dr. Brand Males, M.D., F.C.C.P,  ?Pulmonary and Critical Care Medicine ?Staff Physician, [place] ?Center Director - Interstitial Lung Disease  Program  ?Medical Director - [facility] ?Pulmonary [facility] ?[place], [place], [address] ? ?NPI Number:  NPI #[id number] ?DEA Number: [id number] ? ?Pager: [phone], If no answer  -> Check AMION or Try [phone] ?Telephone (clinical office): [phone] ?Telephone (research): [phone] ? ?10:18 AM ?01/28/2022 ? ?

## 2022-01-28 NOTE — Telephone Encounter (Signed)
Attempted to call pt but unable to reach. Left message for her to return call. 

## 2022-01-28 NOTE — Progress Notes (Signed)
? ? ? ?OV 06/19/2020 ? ?Subjective:  ?Patient ID: Ashley Henry, female , DOB: February 24, 1976 , age 46 y.o. , MRN: [id number] , ADDRESS: [address] ?[place] [address]  ?PCP Burchette, Alinda Sierras, MD ?Attending : Treatment Team:  ?Attending Provider: Brand Males, MD ? ? ? ?06/19/2020 -   ?Chief Complaint  ?Patient presents with  ? Consult  ?  Patient is here for a productive/dry cough that she has had for several months with brown/yellow thivk sputum. Shortness of breath with exertion, stong smells, to hot.   ? ? ? ?HPI ?Ashley Henry 46 y.o. -referred by primary care physician Burchette, Alinda Sierras, MD.  Patient is a psychotherapist.  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  Her 3 maternal uncles, her brother and her maternal aunt all have sarcoidosis.  Her uncles are deceased although from other reasons.  Her brother and her aunt are still living.  She tells me that in the last 6 months he has had insidious onset of chronic cough, shortness of breath, night sweats and chills.  These have persisted.  This is despite having an intentional 8 pound weight loss during this time.  She says cough is made worse by talking or exertion.  Similarly shortness of breath is made worse by exertion.  Also made worse by talking through the mask during counseling sessions.  Both are relieved by rest.  Cough is also relieved by not talking.  She does have nocturnal awakening with cough chest tightness and wheezing.  She also reports right infra axillary right upper quadrant pain with coughing.  She does feel chest tightness deep inside the chest.  She had CT scan of the chest that I personally visualized.  Shows mediastinal lymphadenopathy consistent with stage I sarcoidosis.  CT scan abdomen cuts on the chest CT show possible evidence of sarcoidosis in the liver.  MRI has been recommended.  There are associated palpitations and night sweats.  Cough is associated with sputum productin ? ? ? ? ?IMPRESSION:  ?1.  Pulmonary parenchymal and thoracic nodal findings which are ?consistent with sarcoidosis. ?2. Heterogeneous density throughout the liver. Possibly ?heterogeneous steatosis. Hepatic involvement of sarcoidosis could ?look similar. If there are right upper quadrant symptoms or abnormal ?liver function test, pre and post contrast abdominal MRI should be ?considered. ?  ?  ?Electronically Signed ?  By: Abigail Miyamoto M.D. ?  On: 05/29/2020 10:59 ? ?ROS ?- per HPI ? ?Results for Ashley, Henry (MRN [id number]) as of 06/19/2020 09:03 ? Ref. Range 09/26/2017 14:27 10/13/2017 16:54 10/06/2018 14:47  ?Anti Nuclear Antibody (ANA) Latest Ref Range: NEGATIVE   NEGATIVE   ?SSA (Ro) (ENA) Antibody, IgG Latest Ref Range: <1.0 NEG AI <1.0 NEG    ?SSB (La) (ENA) Antibody, IgG Latest Ref Range: <1.0 NEG AI <1.0 NEG    ? ? ? ?07/13/2020 Follow up : Cough/? Sarcoid /Abnormal CT chest and MRI AB  ? ?46 year old female never smoker seen for pulmonary consult June 19, 2020 for 40-monthhistory of chronic cough shortness of breath night sweats and abnormal CT chest suspicious for sarcoidosis.  Patient has a very strong family history of sarcoidosis. ? ?Patient presents for a 3-week follow-up.  Patient was seen last visit for pulmonary consult for a 616-monthistory of cough shortness of breath night sweats and chills.  She was seen by her primary care provider.  Subsequent CT chest May 29, 2020 showed pulmonary nodularity, possible bilateral bilateral hilar  adenopathy, liver abnormalities.  This was suspicious for possible sarcoidosis.  Lab work last visit showed an elevated ACE level at 82 and a high sed rate at greater than 130.  Her rheumatoid factor was positive but CCP was negative.  INR was slightly elevated at 1.1.  Patient anemia was stable with hemoglobin at 9.3.  Her platelets were normal.  Hepatic function testing was normal.  Patient was set up for an MRI abdomen that showed abnormalities with a hyperenhancing area in the medial  segment of the left hepatic lobe hepatic nodularity.  And splenic lesions.  There was suspicion for portal hypertension with possible varices versus a distal esophageal hyperenhancement.  There was no signs of steatosis. ?Patient has significant fatigue low energy decreased activity tolerance cough joint pains.  She has had intermittent rashes but has none currently.  She has had a recent eye exam with no reported abnormalities. ?We discussed all of her test and lab results. ?Has very strong family history of sarcoidosis with 3 maternal uncles, her brother and a maternal aunt that all have sarcoidosis. ?Patient has had intentional weight loss says that she has been on a diet and is down greater than 89 pounds. ?She denies any hemoptysis bloody stools hematemesis, bruising. ? ?We had a long discussion regarding her abnormalities in her lab work and recent MRI of her liver and spleen.  We discussed the need to proceed with probable bronchoscopy with biopsy and referral to GI for consideration for further work-up of liver involvement and abnormalities. ?Unfortunately patient's medical insurance is going to stop July 16, 2020. ? ?She declines flu shot and Covid vaccines ? ?Patient works independently as a Management consultant. ? ? ? ?TEST/EVENTS :  ?Chest x-ray September 29, 2017 clear lungs no acute process ? ?Renal CT April 2017-no focal abnormalities noted in the liver, spleen or pancreas, nonobstructive left nephrolithiasis, moderate right hydroureter nephrosis ? ?CT chest without contrast May 29, 2020 showed perilymphatic, peribronchovascular and subpleural distribution of pulmonary nodularity throughout right apical nodule 5 mm and nodularity along the left major fissure measuring 9 mm.  Heterogeneous density throughout the liver.,  Possible mild bilateral hilar adenopathy ? ?MR abdomen July 08, 2020 hyperenhancing area in the medial segment of the left hepatic lobe, background of nodular hepatic contours and  hepatic heterogeneity atypical for sarcoid associated lesion given arterial enhancement other areas more typical for sarcoid but remain nonspecific.,  Splenic lesions noted.  Potential portal hypertension with varices versus distal esophageal hyperenhancement, no signs of steatosis ? ?Labs June 19, 2020: QuantiFERON gold negative, Sjogren's negative, LFTs normal, rheumatoid factor positive, CCP negative, INR 1.1, allergy profile negative, IgE 67, ?Anti-DNA antibody negative, hepatic panel normal, platelets normal, WBC 7.1 ?ACE level elevated 82, sed rate greater than 130, vitamin D 8, hemoglobin 9.3 hematocrit 28 ? ?2D echo July 07, 2020 EF 55 to 60%, mild LVH, pulmonary artery pressure normal ? ?Previous iron levels were noted low ? ?HIV 2019 neg.  ? ?OV 11/22/2021 ? ?Subjective:  ?Patient ID: Ashley Henry, female , DOB: 1976/05/06 , age 81 y.o. , MRN: [id number] , ADDRESS: [address] ?[place] [address] ?PCP Burchette, Alinda Sierras, MD ?Patient Care Team: ?Eulas Post, MD as PCP - General ? ?This Provider for this visit: Treatment Team:  ?Attending Provider: Brand Males, MD ? ? ? ?11/22/2021 -   ?Chief Complaint  ?Patient presents with  ? Follow-up  ?  Pt states she is coughing a lot each day and also  states that her skin has been breaking out. States first thing in the morning she will cough up a lot of phlegm. States that she does have increased SOB especially when exercising.  ? ?  ICD-10-CM   ?1. Sarcoidosis  D86.9   ?  ?2. Chronic cough  R05.3   ?  ?3. Dyspnea on exertion  R06.09   ?  ?4. Mediastinal adenopathy  R59.0   ?  ?5. Vitamin D deficiency  E55.9   ?  ?6. Anemia, unspecified type  D64.9   ?  ?7. Rheumatoid factor positive  R76.8   ?  ?8. Abnormal CT of liver  R93.2   ?  ?9. Pigmented skin lesions  L81.9   ?  ? ? ? ?HPI ?Ashley Henry 46 y.o. -returns for follow-up.  Last seen personally in 2021.  At that time the concern was all his symptoms are pointing toward sarcoidosis.  She  then saw a nurse practitioner Patricia Nettle in October 2021.  She was confirmed to have sarcoidosis clinically based on high angiotensin-converting enzyme, African-American ethnicity, age and sympto

## 2022-01-28 NOTE — Patient Instructions (Addendum)
Sarcoidosis ?Chronic cough ?Dyspnea on exertion ?Mediastinal adenopathy ? ? - Features are consistent with  sarcoidosis is extremely active ?- Improved after prednisone ? ?Plan ? -  prednisone  '20mg'$  per day x 2 weeks ongoing and and then '10mg'$  per day to continue ?- do blood work HgbA1c and g6pd 01/28/2022 ?- sprimetry and dlco in 8 weeks ?- goal symptom control as main goal and avoiding disese progression via prednisone and monitoring ? - - methotrexate 2nd line indicated for prednisone side effects or disease progression ? ?Mitral valve regurgitation - moderate and Mild aortic dilatation - echo new findings may 2023 ? ?Plan ? - please set an appt with Dr Bettina Gavia; I will send him a message too ? ?Anemia, unspecified type -noted in October 2021. Persistent on March 2023 ? ?Seems like Iron Deficiency anemia ? ?Plan ?-Please address with PCP Burchette, Alinda Sierras, MD - we need to correct this ? ? ? ?Rheumatoid factor positive -mildly positive in 2021 and persistent though March 2023 ?Associted arthralgia ? ?- can be sarcoid arthritis versus RA ? ? ?Plan ? - refer rheumatology - did you see ? If you wanto wait and see cardiology first that is fine ? ? ?Abnormal CT of liver along with MRI of the liver and spleen in 2021 ad in CT March 2023 ? ?-  understand last visit you want to stagger visits due to your work scheduled ? ?Plan ?- Refer to gastroenterology some time in future ? ?Pigmented skin lesions ? ?-Very suspicious of sarcoidosis.  ?- Imrpoved with steroids ? ?Plan ?- Refer to dermatology some poitn in future if needed ? ?Follow-up ?- 8 weeks with Dr Chase Caller but after spirmetry/dlco ?

## 2022-01-28 NOTE — Telephone Encounter (Signed)
Hgaba1c - borderline pre-diabetes. Diabeest for past few years. Now hhgb a1 at 6.5 and technically at diabetes.  ? ?Plan ? - continue pred taper ?- see PCP Burchette, Alinda Sierras, MD - might need metformin ? ?

## 2022-01-29 ENCOUNTER — Encounter: Payer: Self-pay | Admitting: Internal Medicine

## 2022-01-30 ENCOUNTER — Encounter: Payer: Self-pay | Admitting: Internal Medicine

## 2022-01-30 NOTE — Telephone Encounter (Signed)
Attempted to call pt but unable to reach.left message for her to return call. 

## 2022-01-30 NOTE — Telephone Encounter (Signed)
Mychart message sent to pt per her request of the results and recommendations. Nothing further needed. ?

## 2022-01-30 NOTE — Telephone Encounter (Signed)
Patient is returning phone call. Not able to be reached by phone. Would like message sent thru mychart. Would like to communicate thru mychart.  ?

## 2022-01-31 NOTE — Telephone Encounter (Signed)
Mychart message was sent to pt 5/17 about results. Nothing further needed.

## 2022-02-04 LAB — GLUCOSE 6 PHOSPHATE DEHYDROGENASE: G-6PDH: 26 U/g Hgb — ABNORMAL HIGH (ref 7.0–20.5)

## 2022-02-06 ENCOUNTER — Telehealth (INDEPENDENT_AMBULATORY_CARE_PROVIDER_SITE_OTHER): Payer: 59 | Admitting: Family Medicine

## 2022-02-06 DIAGNOSIS — J019 Acute sinusitis, unspecified: Secondary | ICD-10-CM

## 2022-02-06 MED ORDER — ALBUTEROL SULFATE (2.5 MG/3ML) 0.083% IN NEBU: 2.5000 mg | INHALATION_SOLUTION | Freq: Four times a day (QID) | RESPIRATORY_TRACT | 5 refills | Status: AC | PRN

## 2022-02-06 MED ORDER — AMOXICILLIN-POT CLAVULANATE 875-125 MG PO TABS: 1.0000 | ORAL_TABLET | Freq: Two times a day (BID) | ORAL | 0 refills | Status: DC

## 2022-02-06 NOTE — Progress Notes (Signed)
Patient ID: Ashley Henry, female   DOB: 14-Sep-1976, 46 y.o.   MRN: [id number]   Virtual Visit via Video Note  I connected with Ashley Henry on 02/06/22 at  5:30 PM EDT by a video enabled telemedicine application and verified that I am speaking with the correct person using two identifiers.  Location patient: home Location provider:work or home office Persons participating in the virtual visit: patient, provider  I discussed the limitations of evaluation and management by telemedicine and the availability of in person appointments. The patient expressed understanding and agreed to proceed.   HPI: Ashley Henry has history of sarcoidosis.  She is on low-dose prednisone.  She relates cough productive of small amount of blood and also some purulent sputum.  She feels this may be related to sinusitis.  She has had some sinus congestion.  No fever.  Requesting refill of albuterol for her nebulizer.  She feels like she may have had some wheezing intermittently.  Albuterol helps.  No sick contacts.  Has had frequent sinusitis in the past.  Has tolerated Augmentin without difficulty.   ROS: See pertinent positives and negatives per HPI.  Past Medical History:  Diagnosis Date   Anemia    HYPERLIPIDEMIA 10/11/2009   Hypertension 2010   no meds now   Kidney stones    Pre-diabetes    HgbA1c 6.2 on 09-30-17   Renal disorder     Past Surgical History:  Procedure Laterality Date   BREAST REDUCTION SURGERY  1999   CESAREAN SECTION  2004   Cysts removed from chest wall     EYE SURGERY Left    MASS EXCISION N/A 04/14/2018   Procedure: EXCISION OF CHEST WALL MASS;  Surgeon: Coralie Keens, MD;  Location: [place];  Service: General;  Laterality: N/A;    Family History  Problem Relation Age of Onset   Hypertension Mother    Diabetes Mother    Hypertension Father    Stomach cancer Paternal Grandmother    Stomach cancer Maternal Uncle    Heart disease Maternal Uncle    Lung cancer  Maternal Grandfather    Prostate cancer Maternal Uncle    Thyroid disease Maternal Uncle     SOCIAL HX: Non-smoker   Current Outpatient Medications:    amoxicillin-clavulanate (AUGMENTIN) 875-125 MG tablet, Take 1 tablet by mouth 2 (two) times daily., Disp: 20 tablet, Rfl: 0   albuterol (PROVENTIL) (2.5 MG/3ML) 0.083% nebulizer solution, Take 3 mLs (2.5 mg total) by nebulization every 6 (six) hours as needed for wheezing or shortness of breath., Disp: 75 mL, Rfl: 5   albuterol (VENTOLIN HFA) 108 (90 Base) MCG/ACT inhaler, INHALE 2 PUFFS INTO THE LUNGS EVERY 6 HOURS AS NEEDED FOR WHEEZING OR SHORTNESS OF BREATH, Disp: 18 g, Rfl: 2   benzonatate (TESSALON) 100 MG capsule, TAKE 1 CAPSULE(100 MG) BY MOUTH THREE TIMES DAILY AS NEEDED FOR COUGH, Disp: 40 capsule, Rfl: 2   BEPREVE 1.5 % SOLN, Place 1 drop into both eyes daily., Disp: 10 mL, Rfl: 5   cyclobenzaprine (FLEXERIL) 5 MG tablet, TAKE 1 TO 2 TABLETS BY MOUTH EVERY NIGHT AS NEEDED FOR MUSCLE SPASMS, Disp: 60 tablet, Rfl: 3   ferrous sulfate (SLOW IRON) 160 (50 Fe) MG TBCR SR tablet, Take 1 tablet (160 mg total) by mouth daily., Disp: 30 tablet, Rfl: 3   fluticasone (CUTIVATE) 0.05 % cream, Apply topically 2 (two) times daily., Disp: 15 g, Rfl: 0   Norgestimate-Ethinyl Estradiol Triphasic (TRI-LO-SPRINTEC) 0.18/0.215/0.25 MG-25 MCG  tab, Take 1 tablet by mouth daily., Disp: 28 tablet, Rfl: 11   omeprazole (PRILOSEC) 40 MG capsule, TAKE 1 CAPSULE(40 MG) BY MOUTH DAILY, Disp: 30 capsule, Rfl: 2   predniSONE (DELTASONE) 10 MG tablet, Take '20mg'$  daily for 2 weeks then continue on '10mg'$  daily, Disp: 30 tablet, Rfl: 5  EXAM:  VITALS per patient if applicable:  GENERAL: alert, oriented, appears well and in no acute distress  HEENT: atraumatic, conjunttiva clear, no obvious abnormalities on inspection of external nose and ears  NECK: normal movements of the head and neck  LUNGS: on inspection no signs of respiratory distress, breathing rate  appears normal, no obvious gross SOB, gasping or wheezing  CV: no obvious cyanosis  MS: moves all visible extremities without noticeable abnormality  PSYCH/NEURO: pleasant and cooperative, no obvious depression or anxiety, speech and thought processing grossly intact  ASSESSMENT AND PLAN:  Discussed the following assessment and plan:  Probable acute sinusitis.  Patient has productive cough and history of sarcoidosis.  Start Augmentin 875 mg twice daily for 10 days.  Refill albuterol for her nebulizer.  Follow-up for any persistent or worsening symptoms.     I discussed the assessment and treatment plan with the patient. The patient was provided an opportunity to ask questions and all were answered. The patient agreed with the plan and demonstrated an understanding of the instructions.   The patient was advised to call back or seek an in-person evaluation if the symptoms worsen or if the condition fails to improve as anticipated.     Carolann Littler, MD

## 2022-02-12 LAB — PULMONARY FUNCTION TEST
DL/VA % pred: 122 %
DL/VA: 5.37 ml/min/mmHg/L
DLCO cor % pred: 70 %
DLCO cor: 15.2 ml/min/mmHg
DLCO unc % pred: 70 %
DLCO unc: 15.2 ml/min/mmHg
FEF 25-75 Post: 1.69 L/sec
FEF 25-75 Pre: 2.34 L/sec
FEF2575-%Change-Post: -27 %
FEF2575-%Pred-Post: 63 %
FEF2575-%Pred-Pre: 87 %
FEV1-%Change-Post: -9 %
FEV1-%Pred-Post: 74 %
FEV1-%Pred-Pre: 82 %
FEV1-Post: 1.83 L
FEV1-Pre: 2.02 L
FEV1FVC-%Change-Post: -3 %
FEV1FVC-%Pred-Pre: 106 %
FEV6-%Change-Post: -6 %
FEV6-%Pred-Post: 73 %
FEV6-%Pred-Pre: 77 %
FEV6-Post: 2.15 L
FEV6-Pre: 2.29 L
FEV6FVC-%Pred-Post: 102 %
FEV6FVC-%Pred-Pre: 102 %
FVC-%Change-Post: -6 %
FVC-%Pred-Post: 71 %
FVC-%Pred-Pre: 76 %
FVC-Post: 2.16 L
FVC-Pre: 2.3 L
Post FEV1/FVC ratio: 85 %
Post FEV6/FVC ratio: 100 %
Pre FEV1/FVC ratio: 88 %
Pre FEV6/FVC Ratio: 100 %
RV % pred: 54 %
RV: 0.9 L
TLC % pred: 62 %
TLC: 3.17 L

## 2022-02-26 ENCOUNTER — Encounter: Payer: Self-pay | Admitting: Internal Medicine

## 2022-03-05 ENCOUNTER — Other Ambulatory Visit: Payer: Self-pay | Admitting: Family Medicine

## 2022-03-12 ENCOUNTER — Other Ambulatory Visit: Payer: Self-pay | Admitting: Family Medicine

## 2022-03-21 ENCOUNTER — Encounter: Payer: Self-pay | Admitting: Family Medicine

## 2022-03-22 ENCOUNTER — Other Ambulatory Visit: Payer: Self-pay

## 2022-03-22 DIAGNOSIS — Z3009 Encounter for other general counseling and advice on contraception: Secondary | ICD-10-CM

## 2022-03-22 MED ORDER — NORGESTIM-ETH ESTRAD TRIPHASIC 0.18/0.215/0.25 MG-25 MCG PO TABS: 1.0000 | ORAL_TABLET | Freq: Every day | ORAL | 1 refills | Status: DC

## 2022-03-22 NOTE — Telephone Encounter (Signed)
Noted  

## 2022-04-19 ENCOUNTER — Other Ambulatory Visit: Payer: Self-pay | Admitting: Family Medicine

## 2022-04-19 DIAGNOSIS — Z3009 Encounter for other general counseling and advice on contraception: Secondary | ICD-10-CM

## 2022-04-23 ENCOUNTER — Other Ambulatory Visit: Payer: Self-pay

## 2022-04-23 DIAGNOSIS — R7303 Prediabetes: Secondary | ICD-10-CM | POA: Insufficient documentation

## 2022-04-23 DIAGNOSIS — N289 Disorder of kidney and ureter, unspecified: Secondary | ICD-10-CM | POA: Insufficient documentation

## 2022-04-23 DIAGNOSIS — E1165 Type 2 diabetes mellitus with hyperglycemia: Secondary | ICD-10-CM | POA: Insufficient documentation

## 2022-04-23 NOTE — Progress Notes (Unsigned)
Cardiology Office Note:    Date:  04/24/2022   ID:  [name], DOB [birth year], MRN [id number]  PCP:  Ashley Post, MD  Cardiologist:  Ashley More, MD    Referring MD: Ashley Post, MD    ASSESSMENT:    1. Nonrheumatic mitral valve regurgitation   2. Sarcoidosis    PLAN:    In order of problems listed above:  As she is symptomatic I think it is important to define if her mitral regurgitation is severe or not I think she should undergo a transesophageal echocardiogram however after reviewing her conventional echo and physical exam I do not think she has severe valvular regurgitation.  This will be set up at [facility]. She may require further evaluation sarcoid at times can involve the heart and even at times these individuals may require CMR she does not have the signature finding of segmental dysfunction of the basilar septum.   Next appointment: 3 months   Medication Adjustments/Labs and Tests Ordered: Current medicines are reviewed at length with the patient today.  Concerns regarding medicines are outlined above.  No orders of the defined types were placed in this encounter.  No orders of the defined types were placed in this encounter.   Chief complaint as part of my evaluation for pulmonary sarcoidosis had a repeat echocardiogram read as moderate to severe mitral regurgitation and referred back to see you.   History of Present Illness:    Ashley Henry is a 46 y.o. female with a hx of iron deficiency anemia and heart murmur last seen by me 11/26/1998.  Echocardiogram performed 01/22/2022 showed normal mitral valve structure mitral regurgitation was rated as moderate to severe normal left ventricular ejection fraction mild LVH right ventricle normal size and function the aortic valve was normal in both the left and right atrium were normal in size.  I reviewed that echo myself the valve is structurally normal left ventricle normal in size and  function left atrium not dilated qualitatively I would graded regurgitation as moderate but by Pisa ERO and volume severity is mild mild.  There is no flow reversal in the pulmonary vein.  Previous echocardiogram 07/07/2020 showed mild mitral regurgitation felt to be physiologic.  Chest CT 12/11/2021 showed dilation of pulmonary artery 37 mm otherwise cardiovascular structures were normal.  Recently has been evaluated and found to have features consistent with sarcoidosis.  Remains anemic recent hemoglobin 5 months ago 9.8.  Compliance with diet, lifestyle and medications: Yes however she does not tolerate oral iron she stopped taking it I asked her to start using chewable children's chewable vitamin with iron the best tolerated most effective form and take it every other day to avoid iron blockade.  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  She has some edema when she takes steroids but she is not having orthopnea PND no palpitation or syncope.  She also at times gets exertional chest discomfort not severe or persistent.  Her physical examination is not particularly impressive for mitral regurgitation  Because she is symptomatic I think she should undergo further evaluation I think a transesophageal echocardiogram would resolve the question of whether she has severe mitral regurgitation which would prompt intervention.  I reviewed the procedure risk benefits and options with the patient.  She has had no recent fever or chills Past Medical History:  Diagnosis Date   Abnormal CT scan of lung 07/13/2020   Abnormal finding on imaging of liver 07/13/2020   Anemia    BACK PAIN, UPPER 07/27/2010   Qualifier: Diagnosis of  By:  Ashley Hashimoto MD, Ashley Henry     BRONCHITIS, ACUTE 07/16/2010   Qualifier: Diagnosis of  By: Ashley Hashimoto MD, Ashley Henry, GREAT TOE 01/08/2010   Qualifier: Diagnosis of  By: Ashley Hashimoto MD, Ashley Henry     Enlarged uterus 05/13/2011   Essential hypertension 09/07/2009   Qualifier: Diagnosis of  By: Ashley Ramp MD, Ashley Henry Right.    HEADACHE 08/03/2010   Qualifier: Diagnosis of  By: Ashley Hashimoto MD, Ashley Henry     HYPERLIPIDEMIA 10/11/2009   Hypertension 2010   no meds now   INGROWN NAIL 02/08/2010   Qualifier: Diagnosis of  By: Ashley Hashimoto MD, Ashley Henry     Iron deficiency anemia due to chronic blood loss 06/20/2014   Kidney stone 10/30/2015   OBESITY 10/11/2009   Qualifier: Diagnosis of  By: Ashley Hashimoto MD, Ashley Henry     PARESTHESIA 08/03/2010   Qualifier: Diagnosis of  By: Ashley Hashimoto MD, Ashley Henry     Pre-diabetes    HgbA1c 6.2 on 09-30-17   Renal disorder    SINUSITIS, ACUTE 12/07/2009   Qualifier: Diagnosis of  By: Ashley Hashimoto MD, Ashley Henry     SORE THROAT 02/14/2010   Qualifier: Diagnosis of  By: Ashley Cava LPN, Ashley Henry D deficiency 07/13/2020    Past Surgical History:  Procedure Laterality Date   BREAST REDUCTION SURGERY  1999   CESAREAN SECTION  2004   Cysts removed from chest wall     EYE SURGERY Left    MASS EXCISION N/A 04/14/2018   Procedure: EXCISION OF CHEST WALL MASS;  Surgeon: Ashley Keens, MD;  Location: [place];  Service: General;  Laterality: N/A;    Current Medications: Current Meds  Medication Sig   albuterol (PROVENTIL) (2.5 MG/3ML) 0.083% nebulizer solution Take 3 mLs (2.5 mg total) by nebulization every 6 (six) hours as needed for wheezing or shortness of breath.   albuterol (VENTOLIN HFA) 108 (90 Base) MCG/ACT inhaler INHALE 2 PUFFS INTO THE LUNGS EVERY 6 HOURS AS NEEDED FOR WHEEZING OR SHORTNESS OF BREATH   benzonatate (TESSALON) 100 MG capsule TAKE 1 CAPSULE(100 MG) BY MOUTH THREE TIMES DAILY AS NEEDED FOR COUGH   BEPREVE 1.5 % SOLN Place 1 drop into both eyes daily.    cyclobenzaprine (FLEXERIL) 5 MG tablet TAKE 1 TO 2 TABLETS BY MOUTH EVERY NIGHT AS NEEDED FOR MUSCLE SPASMS   ferrous sulfate (SLOW IRON) 160 (50 Fe) MG TBCR SR tablet Take 1 tablet (160 mg total) by mouth daily.   fluticasone (CUTIVATE) 0.05 % cream Apply topically 2 (two) times daily.   Norgestimate-Ethinyl Estradiol Triphasic (TRI-LO-SPRINTEC) 0.18/0.215/0.25 MG-25 MCG tab TAKE 1 TABLET BY MOUTH DAILY   omeprazole (PRILOSEC) 40 MG capsule TAKE 1 CAPSULE(40 MG) BY MOUTH DAILY   predniSONE (DELTASONE) 10 MG tablet Take 10 mg by mouth daily with breakfast.     Allergies:   Multihance [gadobenate], Amlodipine, Collagenase, Collagenase clostridium histolyticum, and Aspirin   Social History   Socioeconomic History   Marital status: Married    Spouse name: [name]   Number of children: [name]   Years of education: [name]   Highest education level:  [name]  Occupational History   [name]  Tobacco Use   Smoking status: Never   Smokeless tobacco: Never  Vaping Use   Vaping Use: Never used  Substance and Sexual Activity   Alcohol use: No    Alcohol/week: 0.0 standard drinks of alcohol   Drug use: No   Sexual activity: Yes    Birth control/protection: Pill, Condom    Comment: 1st intercourse 46 yo-Fewer than 5 partners  Other Topics Concern   [name]  Social History Narrative   [name]   Social Determinants of Health   Financial Resource Strain: [name]  Food Insecurity: [name]  Transportation Needs: [name]  Physical Activity: [name]  Stress: [name]  Social Connections: [name]     Family History: The patient's family history includes Diabetes in her mother; Heart disease in her maternal uncle; Hypertension in her father and mother; Lung cancer in her maternal grandfather; Prostate cancer in her maternal uncle; Stomach cancer in her maternal uncle and paternal grandmother; Thyroid disease in her maternal uncle. ROS:    Please see the history of present illness.    All other systems reviewed and are negative.  EKGs/Labs/Other Studies Reviewed:    The following studies were reviewed today:  EKG:  EKG ordered today and personally reviewed.  The ekg ordered today demonstrates sinus tachycardia normal PR interval poor R wave progression  Recent Labs: 11/22/2021: ALT 19; BUN 15; Creatinine, Ser 1.12; Hemoglobin 9.8; Platelets 367.0; Potassium 4.0; Sodium 134  Recent Lipid Panel    Component Value Date/Time   CHOL 203 (H) 09/29/2017 1420   TRIG 101 09/29/2017 1420   HDL 46 (L) 09/29/2017 1420   CHOLHDL 4.4 09/29/2017 1420   VLDL 16 06/17/2016 1243   LDLCALC 137 (H) 09/29/2017 1420    Physical Exam:    VS:  BP (!) 170/98   Pulse (!) 114   Ht '5\' 4"'$  (1.626 m)   Wt 273 lb 1.3 oz (123.9 kg)   SpO2 96%   BMI 46.87 kg/m     Wt Readings from Last 3 Encounters:  04/24/22 273 lb 1.3 oz (123.9 kg)  01/28/22 258 lb 9.6 oz (117.3 kg)  12/12/21 246 lb 9.6 oz (111.9 kg)     GEN: Obese BMI approaches 47 well nourished, well developed in no acute distress HEENT: Normal NECK: No JVD; No carotid bruits LYMPHATICS: No lymphadenopathy CARDIAC: Grade 1/6 to 2/6 systolic murmur left sternal border apex no S3 no murmur functional mitral stenosis P2 appears to be accentuated RRR, no murmurs, rubs, gallops RESPIRATORY:  Clear to auscultation without rales, wheezing or rhonchi  ABDOMEN: Soft, non-tender, non-distended MUSCULOSKELETAL:  No edema; No deformity  SKIN: Warm and dry NEUROLOGIC:  Alert and oriented x 3 PSYCHIATRIC:  Normal affect    Signed, Ashley More, MD  04/24/2022 8:49 AM    [facility]

## 2022-04-23 NOTE — H&P (View-Only) (Signed)
Cardiology Office Note:    Date:  04/24/2022   ID:  Ashley Henry, DOB Oct 16, 1975, MRN [id number]  PCP:  Eulas Post, MD  Cardiologist:  Shirlee More, MD    Referring MD: Eulas Post, MD    ASSESSMENT:    1. Nonrheumatic mitral valve regurgitation   2. Sarcoidosis    PLAN:    In order of problems listed above:  As she is symptomatic I think it is important to define if her mitral regurgitation is severe or not I think she should undergo a transesophageal echocardiogram however after reviewing her conventional echo and physical exam I do not think she has severe valvular regurgitation.  This will be set up at [facility]. She may require further evaluation sarcoid at times can involve the heart and even at times these individuals may require CMR she does not have the signature finding of segmental dysfunction of the basilar septum.   Next appointment: 3 months   Medication Adjustments/Labs and Tests Ordered: Current medicines are reviewed at length with the patient today.  Concerns regarding medicines are outlined above.  No orders of the defined types were placed in this encounter.  No orders of the defined types were placed in this encounter.   Chief complaint as part of my evaluation for pulmonary sarcoidosis had a repeat echocardiogram read as moderate to severe mitral regurgitation and referred back to see you.   History of Present Illness:    Ashley Henry is a 46 y.o. female with a hx of iron deficiency anemia and heart murmur last seen by me 11/26/1998.  Echocardiogram performed 01/22/2022 showed normal mitral valve structure mitral regurgitation was rated as moderate to severe normal left ventricular ejection fraction mild LVH right ventricle normal size and function the aortic valve was normal in both the left and right atrium were normal in size.  I reviewed that echo myself the valve is structurally normal left ventricle normal in size and  function left atrium not dilated qualitatively I would graded regurgitation as moderate but by Pisa ERO and volume severity is mild mild.  There is no flow reversal in the pulmonary vein.  Previous echocardiogram 07/07/2020 showed mild mitral regurgitation felt to be physiologic.  Chest CT 12/11/2021 showed dilation of pulmonary artery 37 mm otherwise cardiovascular structures were normal.  Recently has been evaluated and found to have features consistent with sarcoidosis.  Remains anemic recent hemoglobin 5 months ago 9.8.  Compliance with diet, lifestyle and medications: Yes however she does not tolerate oral iron she stopped taking it I asked her to start using chewable children's chewable vitamin with iron the best tolerated most effective form and take it every other day to avoid iron blockade.  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  She has some edema when she takes steroids but she is not having orthopnea PND no palpitation or syncope.  She also at times gets exertional chest discomfort not severe or persistent.  Her physical examination is not particularly impressive for mitral regurgitation  Because she is symptomatic I think she should undergo further evaluation I think a transesophageal echocardiogram would resolve the question of whether she has severe mitral regurgitation which would prompt intervention.  I reviewed the procedure risk benefits and options with the patient.  She has had no recent fever or chills Past Medical History:  Diagnosis Date   Abnormal CT scan of lung 07/13/2020   Abnormal finding on imaging of liver 07/13/2020   Anemia    BACK PAIN, UPPER 07/27/2010   Qualifier: Diagnosis of  By:  Elease Hashimoto MD, Bruce     BRONCHITIS, ACUTE 07/16/2010   Qualifier: Diagnosis of  By: Elease Hashimoto MD, Jerrol Banana, GREAT TOE 01/08/2010   Qualifier: Diagnosis of  By: Elease Hashimoto MD, Bruce     Enlarged uterus 05/13/2011   Essential hypertension 09/07/2009   Qualifier: Diagnosis of  By: Ronnald Ramp MD, Arvid Right.    HEADACHE 08/03/2010   Qualifier: Diagnosis of  By: Elease Hashimoto MD, Bruce     HYPERLIPIDEMIA 10/11/2009   Hypertension 2010   no meds now   INGROWN NAIL 02/08/2010   Qualifier: Diagnosis of  By: Elease Hashimoto MD, Bruce     Iron deficiency anemia due to chronic blood loss 06/20/2014   Kidney stone 10/30/2015   OBESITY 10/11/2009   Qualifier: Diagnosis of  By: Elease Hashimoto MD, Bruce     PARESTHESIA 08/03/2010   Qualifier: Diagnosis of  By: Elease Hashimoto MD, Bruce     Pre-diabetes    HgbA1c 6.2 on 09-30-17   Renal disorder    SINUSITIS, ACUTE 12/07/2009   Qualifier: Diagnosis of  By: Elease Hashimoto MD, Bruce     SORE THROAT 02/14/2010   Qualifier: Diagnosis of  By: Valma Cava LPN, Madilyn Hook D deficiency 07/13/2020    Past Surgical History:  Procedure Laterality Date   BREAST REDUCTION SURGERY  1999   CESAREAN SECTION  2004   Cysts removed from chest wall     EYE SURGERY Left    MASS EXCISION N/A 04/14/2018   Procedure: EXCISION OF CHEST WALL MASS;  Surgeon: Coralie Keens, MD;  Location: [place];  Service: General;  Laterality: N/A;    Current Medications: Current Meds  Medication Sig   albuterol (PROVENTIL) (2.5 MG/3ML) 0.083% nebulizer solution Take 3 mLs (2.5 mg total) by nebulization every 6 (six) hours as needed for wheezing or shortness of breath.   albuterol (VENTOLIN HFA) 108 (90 Base) MCG/ACT inhaler INHALE 2 PUFFS INTO THE LUNGS EVERY 6 HOURS AS NEEDED FOR WHEEZING OR SHORTNESS OF BREATH   benzonatate (TESSALON) 100 MG capsule TAKE 1 CAPSULE(100 MG) BY MOUTH THREE TIMES DAILY AS NEEDED FOR COUGH   BEPREVE 1.5 % SOLN Place 1 drop into both eyes daily.    cyclobenzaprine (FLEXERIL) 5 MG tablet TAKE 1 TO 2 TABLETS BY MOUTH EVERY NIGHT AS NEEDED FOR MUSCLE SPASMS   ferrous sulfate (SLOW IRON) 160 (50 Fe) MG TBCR SR tablet Take 1 tablet (160 mg total) by mouth daily.   fluticasone (CUTIVATE) 0.05 % cream Apply topically 2 (two) times daily.   Norgestimate-Ethinyl Estradiol Triphasic (TRI-LO-SPRINTEC) 0.18/0.215/0.25 MG-25 MCG tab TAKE 1 TABLET BY MOUTH DAILY   omeprazole (PRILOSEC) 40 MG capsule TAKE 1 CAPSULE(40 MG) BY MOUTH DAILY   predniSONE (DELTASONE) 10 MG tablet Take 10 mg by mouth daily with breakfast.     Allergies:   Multihance [gadobenate], Amlodipine, Collagenase, Collagenase clostridium histolyticum, and Aspirin   Social History   Socioeconomic History   Marital status: Married    Spouse name: [name]   Number of children: [name]   Years of education: [name]   Highest education level:  [name]  Occupational History   [name]  Tobacco Use   Smoking status: Never   Smokeless tobacco: Never  Vaping Use   Vaping Use: Never used  Substance and Sexual Activity   Alcohol use: No    Alcohol/week: 0.0 standard drinks of alcohol   Drug use: No   Sexual activity: Yes    Birth control/protection: Pill, Condom    Comment: 1st intercourse 46 yo-Fewer than 5 partners  Other Topics Concern   [name]  Social History Narrative   [name]   Social Determinants of Health   Financial Resource Strain: [name]  Food Insecurity: [name]  Transportation Needs: [name]  Physical Activity: [name]  Stress: [name]  Social Connections: [name]     Family History: The patient's family history includes Diabetes in her mother; Heart disease in her maternal uncle; Hypertension in her father and mother; Lung cancer in her maternal grandfather; Prostate cancer in her maternal uncle; Stomach cancer in her maternal uncle and paternal grandmother; Thyroid disease in her maternal uncle. ROS:    Please see the history of present illness.    All other systems reviewed and are negative.  EKGs/Labs/Other Studies Reviewed:    The following studies were reviewed today:  EKG:  EKG ordered today and personally reviewed.  The ekg ordered today demonstrates sinus tachycardia normal PR interval poor R wave progression  Recent Labs: 11/22/2021: ALT 19; BUN 15; Creatinine, Ser 1.12; Hemoglobin 9.8; Platelets 367.0; Potassium 4.0; Sodium 134  Recent Lipid Panel    Component Value Date/Time   CHOL 203 (H) 09/29/2017 1420   TRIG 101 09/29/2017 1420   HDL 46 (L) 09/29/2017 1420   CHOLHDL 4.4 09/29/2017 1420   VLDL 16 06/17/2016 1243   LDLCALC 137 (H) 09/29/2017 1420    Physical Exam:    VS:  BP (!) 170/98   Pulse (!) 114   Ht '5\' 4"'$  (1.626 m)   Wt 273 lb 1.3 oz (123.9 kg)   SpO2 96%   BMI 46.87 kg/m     Wt Readings from Last 3 Encounters:  04/24/22 273 lb 1.3 oz (123.9 kg)  01/28/22 258 lb 9.6 oz (117.3 kg)  12/12/21 246 lb 9.6 oz (111.9 kg)     GEN: Obese BMI approaches 47 well nourished, well developed in no acute distress HEENT: Normal NECK: No JVD; No carotid bruits LYMPHATICS: No lymphadenopathy CARDIAC: Grade 1/6 to 2/6 systolic murmur left sternal border apex no S3 no murmur functional mitral stenosis P2 appears to be accentuated RRR, no murmurs, rubs, gallops RESPIRATORY:  Clear to auscultation without rales, wheezing or rhonchi  ABDOMEN: Soft, non-tender, non-distended MUSCULOSKELETAL:  No edema; No deformity  SKIN: Warm and dry NEUROLOGIC:  Alert and oriented x 3 PSYCHIATRIC:  Normal affect    Signed, Shirlee More, MD  04/24/2022 8:49 AM    [facility]

## 2022-04-24 ENCOUNTER — Encounter: Payer: Self-pay | Admitting: Cardiology

## 2022-04-24 ENCOUNTER — Other Ambulatory Visit: Payer: Self-pay

## 2022-04-24 ENCOUNTER — Ambulatory Visit: Payer: 59 | Admitting: Cardiology

## 2022-04-24 DIAGNOSIS — D869 Sarcoidosis, unspecified: Secondary | ICD-10-CM

## 2022-04-24 DIAGNOSIS — I340 Nonrheumatic mitral (valve) insufficiency: Secondary | ICD-10-CM

## 2022-04-24 NOTE — Patient Instructions (Addendum)
Medication Instructions:  Your physician recommends that you continue on your current medications as directed. Please refer to the Current Medication list given to you today.  *If you need a refill on your cardiac medications before your next appointment, please call your pharmacy*   Lab Work: Your physician recommends that you return for lab work in:   Labs today: BMP, CBC  If you have labs (blood work) drawn today and your tests are completely normal, you will receive your results only by: MyChart Message (if you have MyChart) OR A paper copy in the mail If you have any lab test that is abnormal or we need to change your treatment, we will call you to review the results.   Testing/Procedures: Dear Ashley Henry You are scheduled for a TEE on August 31st with Dr. Harrington Challenger.  Please arrive at the [facility] (Main Entrance A) at [facility]: [address] [place], [place] [address] at 6:30 am. (1 hour prior to procedure unless lab work is needed; if lab work is needed arrive 1.5 hours ahead)  DIET: Nothing to eat or drink after midnight except a sip of water with medications (see medication instructions below)  FYI: For your safety, and to allow [place] to monitor your vital signs accurately during the surgery/procedure we request that   if you have artificial nails, gel coating, SNS etc. Please have those removed prior to your surgery/procedure. Not having the nail coverings /polish removed may result in cancellation or delay of your surgery/procedure.   Medication Instructions: None   Labs: Come to: [facility] on the 2nd floor at the end of the hallway to have your blood drawn 1 week before your procedure.   You must have a responsible person to drive you home and stay in the waiting area during your procedure. Failure to do so could result in cancellation.  Bring your insurance cards.  *Special Note: Every effort is made to have your procedure done on time. Occasionally  there are emergencies that occur at the hospital that may cause delays. Please be patient if a delay does occur.     Follow-Up: At [facility], you and your health needs are our priority.  As part of our continuing mission to provide you with exceptional heart care, we have created designated Provider Care Teams.  These Care Teams include your primary Cardiologist (physician) and Advanced Practice Providers (APPs -  Physician Assistants and Nurse Practitioners) who all work together to provide you with the care you need, when you need it.  We recommend signing up for the patient portal called "MyChart".  Sign up information is provided on this After Visit Summary.  MyChart is used to connect with patients for Virtual Visits (Telemedicine).  Patients are able to view lab/test results, encounter notes, upcoming appointments, etc.  Non-urgent messages can be sent to your provider as well.   To learn more about what you can do with MyChart, go to [email or web address].    Your next appointment:   3 month(s)  The format for your next appointment:   In Person  Provider:   Shirlee More, MD    Other Instructions Please take a childrens chewable vitamin with iron every other day  Important Information About Sugar

## 2022-05-09 ENCOUNTER — Encounter: Payer: Self-pay | Admitting: Family Medicine

## 2022-05-09 ENCOUNTER — Encounter (HOSPITAL_COMMUNITY): Payer: Self-pay | Admitting: Internal Medicine

## 2022-05-10 ENCOUNTER — Other Ambulatory Visit: Payer: Self-pay

## 2022-05-10 ENCOUNTER — Telehealth: Payer: Self-pay

## 2022-05-10 MED ORDER — BEPREVE 1.5 % OP SOLN
1.0000 [drp] | Freq: Every day | OPHTHALMIC | 5 refills | Status: DC
Start: 2022-05-10 — End: 2023-07-15

## 2022-05-10 NOTE — Telephone Encounter (Signed)
Called patient regarding re-scheduling her TEE procedure. Patient did not answer the phone, left message for the patient to call us back.

## 2022-05-10 NOTE — Telephone Encounter (Signed)
   Pt called back and would like to keep her TEE on 08/31. She do not want to reschedule.

## 2022-05-14 ENCOUNTER — Encounter: Payer: Self-pay | Admitting: Internal Medicine

## 2022-05-14 ENCOUNTER — Ambulatory Visit: Payer: 59

## 2022-05-14 ENCOUNTER — Ambulatory Visit (INDEPENDENT_AMBULATORY_CARE_PROVIDER_SITE_OTHER): Payer: 59 | Admitting: Internal Medicine

## 2022-05-14 ENCOUNTER — Ambulatory Visit: Payer: 59 | Admitting: Internal Medicine

## 2022-05-14 ENCOUNTER — Telehealth: Payer: Self-pay | Admitting: Internal Medicine

## 2022-05-14 VITALS — BP 140/100 | HR 109 | Ht 64.0 in | Wt 268.0 lb

## 2022-05-14 DIAGNOSIS — D869 Sarcoidosis, unspecified: Secondary | ICD-10-CM | POA: Diagnosis not present

## 2022-05-14 DIAGNOSIS — Z5181 Encounter for therapeutic drug level monitoring: Secondary | ICD-10-CM

## 2022-05-14 DIAGNOSIS — R002 Palpitations: Secondary | ICD-10-CM | POA: Diagnosis not present

## 2022-05-14 LAB — PULMONARY FUNCTION TEST
FEF 25-75 Pre: 0.74 L/sec
FEF2575-%Pred-Pre: 24 %
FEV1-%Pred-Pre: 41 %
FEV1-Pre: 1.22 L
FEV1FVC-%Pred-Pre: 77 %
FEV6-%Pred-Pre: 54 %
FEV6-Pre: 1.95 L
FEV6FVC-%Pred-Pre: 102 %
FVC-%Pred-Pre: 53 %
FVC-Pre: 1.95 L
Pre FEV1/FVC ratio: 63 %
Pre FEV6/FVC Ratio: 100 %

## 2022-05-14 MED ORDER — PREDNISONE 10 MG PO TABS
ORAL_TABLET | ORAL | 1 refills | Status: DC
Start: 2022-05-14 — End: 2022-06-10

## 2022-05-14 MED ORDER — METHOTREXATE 2.5 MG PO TABS: 10.0000 mg | ORAL_TABLET | ORAL | 2 refills | Status: DC

## 2022-05-14 NOTE — Telephone Encounter (Signed)
Dear Dr Bettina Gavia   Ashley Henry - reproted she is having palpitations. Her sarcoid is pretty active . Appreciate you gettng TEE. Wondering ifshe will beneit from [employer] or some kind of monitoring for arrhythmias which our sarcoid patients are prone to?  Thanks for consideration     SIGNATURE    Dr. Brand Males, M.D., F.C.C.P,  Pulmonary and Critical Care Medicine Staff Physician, [place] Director - Interstitial Lung Disease  Program  Medical Director - [facility] [place] at [place], [place], [address]  NPI Number:  NPI #[id number] [facility] Number: [id number]  Pager: [phone], If no answer  -[place] or Try [phone] Telephone (clinical office): [phone] Telephone (research): [phone]  4:54 PM 05/14/2022 .

## 2022-05-14 NOTE — Addendum Note (Signed)
Addended by: Lorretta Harp on: 05/14/2022 05:16 PM   Modules accepted: Orders

## 2022-05-14 NOTE — Telephone Encounter (Signed)
Called patient and verified that instructions for her TEE had been reviewed with her. She also had questions about where to have her blood drawn and those questions were answered as well. Patient had no further questions at this time.

## 2022-05-14 NOTE — Patient Instructions (Signed)
Spirometry performed today. 

## 2022-05-14 NOTE — Patient Instructions (Addendum)
Sarcoidosis Chronic cough Dyspnea on exertion Mediastinal adenopathy Current use of chronic steroids   - Features are consistent with  sarcoidosis is extremely active - Improved after prednisone but worse with prednisone taper to '10mg'$  per day - PFT worse but could be due to cough +/- sarcoid worening - safety labs - normal G6PDH May 2023 and Percival Spanish March 2023  Plan  - check cbc, bmet, lft 05/14/2022 - check HgbA1c 05/14/2022 - CXR  05/14/2022 OR next visit -prednisone '40mg'$  daily x 10 days -> then '30mg'$  daily x 10 days, then '20mg'$  daily x 10 days and then '10mg'$  daily to continue - start methotrexate  oral '10mg'$  once a week on Sundays or Mondays -refer [facility] for methotrexate counseling and monitoring - at some point if needed we can take 2nd opinoon from Dr Daiva Huge our sarcoid expert - will need RSV, flu covid vaccines inf all 2023  Mitral valve regurgitation - moderate and Mild aortic dilatation - echo new findings may 2023 Hx of palpitations  Plan  -TEE per Dr Bettina Gavia - will email him about getting heart monitor for palpitations  Anemia, unspecified type -noted in October 2021. Persistent on March 2023  Seems like Iron Deficiency anemia  Plan -recheck cbc 05/14/2022     Rheumatoid factor positive -mildly positive in 2021 and persistent though March 2023 Associted arthralgia  - can be sarcoid arthritis versus RA   Plan  - monitor -  - at some point can decide on rheumatology referral   Abnormal CT of liver along with MRI of the liver and spleen in 2021 ad in CT March 2023  -  understand last visit you want to stagger visits due to your work scheduled  Plan - Refer to gastroenterology some time in future  Pigmented skin lesions  -Very suspicious of sarcoidosis.  - Imrpoved with steroids bu worse again 05/14/2022 with prednisone taper  Plan - Refer to dermatology some poitn in future if needed  Follow-up - 8 weeks with Dr Chase Caller - 30 min visit

## 2022-05-14 NOTE — Progress Notes (Signed)
Spirometry performed today. 

## 2022-05-14 NOTE — Progress Notes (Signed)
OV 06/19/2020  Subjective:  Patient ID: Ashley Henry, female , DOB: Jul 22, 1976 , age 46 y.o. , MRN: [id number] , ADDRESS: [address] [place] [address]  PCP Eulas Post, MD Attending : Treatment Team:  Attending Provider: Brand Males, MD    06/19/2020 -   Chief Complaint  Patient presents with   Consult    Patient is here for a productive/dry cough that she has had for several months with brown/yellow thivk sputum. Shortness of breath with exertion, stong smells, to hot.      HPI Ashley Henry 46 y.o. -referred by primary care physician Burchette, Alinda Sierras, MD.  Patient is a psychotherapist.  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  Her 3 maternal uncles, her brother and her maternal aunt all have sarcoidosis.  Her uncles are deceased although from other reasons.  Her brother and her aunt are still living.  She tells me that in the last 6 months he has had insidious onset of chronic cough, shortness of breath, night sweats and chills.  These have persisted.  This is despite having an intentional 8 pound weight loss during this time.  She says cough is made worse by talking or exertion.  Similarly shortness of breath is made worse by exertion.  Also made worse by talking through the mask during counseling sessions.  Both are relieved by rest.  Cough is also relieved by not talking.  She does have nocturnal awakening with cough chest tightness and wheezing.  She also reports right infra axillary right upper quadrant pain with coughing.  She does feel chest tightness deep inside the chest.  She had CT scan of the chest that I personally visualized.  Shows mediastinal lymphadenopathy consistent with stage I sarcoidosis.  CT scan abdomen cuts on the chest CT show possible evidence of sarcoidosis in the liver.  MRI has been recommended.  There are associated palpitations and night sweats.  Cough is associated with sputum productin     IMPRESSION:  1.  Pulmonary parenchymal and thoracic nodal findings which are consistent with sarcoidosis. 2. Heterogeneous density throughout the liver. Possibly heterogeneous steatosis. Hepatic involvement of sarcoidosis could look similar. If there are right upper quadrant symptoms or abnormal liver function test, pre and post contrast abdominal MRI should be considered.     Electronically Signed   By: Abigail Miyamoto M.D.   On: 05/29/2020 10:59  ROS - per HPI  Results for RHYLEN, PULIDO (MRN [id number]) as of 06/19/2020 09:03  Ref. Range 09/26/2017 14:27 10/13/2017 16:54 10/06/2018 14:47  Anti Nuclear Antibody (ANA) Latest Ref Range: NEGATIVE   NEGATIVE   SSA (Ro) (ENA) Antibody, IgG Latest Ref Range: <1.0 NEG AI <1.0 NEG    SSB (La) (ENA) Antibody, IgG Latest Ref Range: <1.0 NEG AI <1.0 NEG       07/13/2020 Follow up : Cough/? Sarcoid /Abnormal CT chest and MRI AB   46 year old female never smoker seen for pulmonary consult June 19, 2020 for 31-month history of chronic cough shortness of breath night sweats and abnormal CT chest suspicious for sarcoidosis.  Patient has a very strong family history of sarcoidosis.  Patient presents for a 3-week follow-up.  Patient was seen last visit for pulmonary consult for a 40-month history of cough shortness of breath night sweats and chills.  She was seen by her primary care provider.  Subsequent CT chest May 29, 2020 showed pulmonary nodularity, possible bilateral bilateral hilar adenopathy, liver  abnormalities.  This was suspicious for possible sarcoidosis.  Lab work last visit showed an elevated ACE level at 82 and a high sed rate at greater than 130.  Her rheumatoid factor was positive but CCP was negative.  INR was slightly elevated at 1.1.  Patient anemia was stable with hemoglobin at 9.3.  Her platelets were normal.  Hepatic function testing was normal.  Patient was set up for an MRI abdomen that showed abnormalities with a hyperenhancing area in the medial  segment of the left hepatic lobe hepatic nodularity.  And splenic lesions.  There was suspicion for portal hypertension with possible varices versus a distal esophageal hyperenhancement.  There was no signs of steatosis. Patient has significant fatigue low energy decreased activity tolerance cough joint pains.  She has had intermittent rashes but has none currently.  She has had a recent eye exam with no reported abnormalities. We discussed all of her test and lab results. Has very strong family history of sarcoidosis with 3 maternal uncles, her brother and a maternal aunt that all have sarcoidosis. Patient has had intentional weight loss says that she has been on a diet and is down greater than 89 pounds. She denies any hemoptysis bloody stools hematemesis, bruising.  We had a long discussion regarding her abnormalities in her lab work and recent MRI of her liver and spleen.  We discussed the need to proceed with probable bronchoscopy with biopsy and referral to GI for consideration for further work-up of liver involvement and abnormalities. Unfortunately patient's medical insurance is going to stop July 16, 2020.  She declines flu shot and Covid vaccines  Patient works independently as a Management consultant.    TEST/EVENTS :  Chest x-ray September 29, 2017 clear lungs no acute process  Renal CT April 2017-no focal abnormalities noted in the liver, spleen or pancreas, nonobstructive left nephrolithiasis, moderate right hydroureter nephrosis  CT chest without contrast May 29, 2020 showed perilymphatic, peribronchovascular and subpleural distribution of pulmonary nodularity throughout right apical nodule 5 mm and nodularity along the left major fissure measuring 9 mm.  Heterogeneous density throughout the liver.,  Possible mild bilateral hilar adenopathy  MR abdomen July 08, 2020 hyperenhancing area in the medial segment of the left hepatic lobe, background of nodular hepatic contours and  hepatic heterogeneity atypical for sarcoid associated lesion given arterial enhancement other areas more typical for sarcoid but remain nonspecific.,  Splenic lesions noted.  Potential portal hypertension with varices versus distal esophageal hyperenhancement, no signs of steatosis  Labs June 19, 2020: QuantiFERON gold negative, Sjogren's negative, LFTs normal, rheumatoid factor positive, CCP negative, INR 1.1, allergy profile negative, IgE 67, Anti-DNA antibody negative, hepatic panel normal, platelets normal, WBC 7.1 ACE level elevated 82, sed rate greater than 130, vitamin D 8, hemoglobin 9.3 hematocrit 28  2D echo July 07, 2020 EF 55 to 60%, mild LVH, pulmonary artery pressure normal  Previous iron levels were noted low  HIV 2019 neg.   OV 11/22/2021  Subjective:  Patient ID: Ashley Henry, female , DOB: 01/25/1976 , age 64 y.o. , MRN: [id number] , ADDRESS: [address] [place] [address] PCP Eulas Post, MD Patient Care Team: Eulas Post, MD as PCP - General  This Provider for this visit: Treatment Team:  Attending Provider: Brand Males, MD    11/22/2021 -   Chief Complaint  Patient presents with   Follow-up    Pt states she is coughing a lot each day and also states that  her skin has been breaking out. States first thing in the morning she will cough up a lot of phlegm. States that she does have increased SOB especially when exercising.     ICD-10-CM   1. Sarcoidosis  D86.9     2. Chronic cough  R05.3     3. Dyspnea on exertion  R06.09     4. Mediastinal adenopathy  R59.0     5. Vitamin D deficiency  E55.9     6. Anemia, unspecified type  D64.9     7. Rheumatoid factor positive  R76.8     8. Abnormal CT of liver  R93.2     9. Pigmented skin lesions  L81.9        HPI ALEJANDRA HUNT 46 y.o. -returns for follow-up.  Last seen personally in 2021.  At that time the concern was all his symptoms are pointing toward sarcoidosis.  She  then saw a nurse practitioner Patricia Nettle in October 2021.  She was confirmed to have sarcoidosis clinically based on high angiotensin-converting enzyme, African-American ethnicity, age and symptoms and CT scan findings of the lung.  She was then prescribed prednisone for 3 months she says during this time all the symptoms resolved.  She did not have insurance so did not do any further work-up.  She says then early last year she ran out of prednisone and since then having significant symptoms.  The symptoms include significant amount of night sweats, shortness of breath and wheezing.  Also severe cough day and night.  In the early morning the cough is productive with yellow sputum but rest of the day the cough is dry.  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  She showed a voice recording of the cough and there is clear laryngeal component to the cough.  Symptoms are rated as severe.  Cough not responding to Tessalon cough.  During this time she had skin lesions and arthralgia.  The skin lesions on the hands forehead.  There is also drying of the palms.  There is arthralgia diffusely.  Vitamin D deficiency: This was diagnosed in 2021.  Not on treatment currently  Anemia: This was diagnosed in 2021.  Not on treatment currently   Liver lesion seen on MRI along with splenic lesions: These were reported as atypical for sarcoid.  She never saw a GI  Skin lesions: This is new since last visit.  She is open to seeing dermatologist.      OV 12/18/2021  Subjective:  Patient ID: Ashley Henry, female , DOB: 04-03-1976 , age 53 y.o. , MRN: [id number] , ADDRESS: [address] [place] [address] PCP Eulas Post, MD Patient Care Team: Eulas Post, MD as PCP - General    12/18/2021 -this video visit is to review test results done due to concern of sarcoidosis.   HPI DORRIE COCUZZA 46 y.o. -since her last visit a few weeks ago she continues to have  the same symptoms of arthralgia and shortness of breath.  And skin lesions.  Referred her to the dermatologist and GI because of concerns of splenic lesions.  She has not yet seen them.  At this point in time she is focused on getting through symptom relief.  We did do investigations and they are as follows  CT scan of the chest: Shows worsening sarcoidosis.  I visualized the film and also showed it to the video  Pulmonary function  test: Not yet done  Blood work: Shows worsening angiotensin-converting enzyme level to greater than 180.  Persistently high ESR.Marland Kitchen  Rheumatoid factor continues to be trace positive.  Continues to be iron deficient anemic with a hemoglobin 9.9 g% which is baseline since 2014.  She says primary care physician has not yet addressed this.  IMPRESSION: 1. There has been dramatic increase in parenchymal lung involvement with extensive progressive perilymphatic nodularity, as detailed above, compatible with worsening sarcoidosis. 2. Multiple large low-attenuation splenic lesions, nonspecific, but likely reflective of sarcoid involvement in the spleen. 3. Dilatation of the pulmonic trunk (3.7 cm in diameter), concerning for pulmonary arterial hypertension. 4. Aortic atherosclerosis.   Aortic Atherosclerosis (ICD10-I70.0).     Electronically Signed   By: Vinnie Langton M.D.   On: 12/12/2021 07:41   01/28/2022 -   Chief Complaint  Patient presents with   Follow-up    PFT performed 01/25/22. Pt states she has been doing okay since last visit and denies any complaints.     HPI JERMIA RIGSBY 46 y.o. -last office visit April 2023 on video.  Presents with her daughter who is at the bedside.  Diagnosis worsening sarcoidosis symptomatic given.  Commenced on prednisone.  I wrote for prednisone taper of 40 mg x 2 weeks and then 20 mg x 2 weeks.  The pharmacist informed that I skipped a dose of 30 mg for 2 weeks.  Therefore she took 40 mg for 2 weeks and then 30 mg for 2  weeks.  She is currently on 20 mg daily and she is finished 1 week of that.  She wants to extend the taper and go to 10 mg/day and then continue that per instructions.  The prednisone is really helped shortness of breath is improved cough is improved.  Her skin rash in the face and hands have improved.  Nevertheless she is having prednisone side effects of increased hunger 10 pound weight gain decreased sleep increased alertness.  Also facial puffiness.  Nevertheless she is continuing with the prednisone and she is okay doing that.  We did get an echocardiogram.  Previous echocardiogram 2021 was normal.  Currently is reporting moderate to severe mitral regurgitation and rater than 4 cm aortic root dilatation.  I did inform her of these findings.  I also passed a message on to Dr. Bettina Gavia her cardiologist.  Pulmonary function shows mild restriction with mild reduction in diffusion capacity.    OV 05/14/2022  Subjective:  Patient ID: Ashley Henry, female , DOB: Aug 31, 1976 , age 44 y.o. , MRN: [id number] , ADDRESS: [address] [place] [address] PCP Eulas Post, MD Patient Care Team: Eulas Post, MD as PCP - General  This Provider for this visit: Treatment Team:  Attending Provider: Brand Males, MD    05/14/2022 -   Chief Complaint  Patient presents with   Follow-up    PFT performed today.  Pt states she is about the same compared to last visit.   Follow-up pulmonary sarcoidosis last CT scan March 2023  HPI TOVE WIDEMAN 46 y.o. -returns for follow-up.  At the last visit her symptoms and the skin lesions were better after prednisone dosage.  We started tapering the prednisone.  Today she presents for follow-up.  She is now on 10 mg/day prednisone but she says with the taper cough is worse shortness of breath is worse wheezing is worse.  In addition skin lesions are also getting worse.  She is still  gaining weight though she states she is gained another 10  pounds of weight on the current 10 mg of prednisone.  She is frustrated by the worsening symptoms.  She did a pulmonary function test today.  This shows worsening of FVC and FEV1 however she did have significant amount of cough which is part of her symptoms from the sarcoid.  Therefore the results are somewhat unreliable.  Her daughter is here with her today.  She had normal G6PD testing are slightly high back in May 2023 March 2023 the QuantiFERON gold was negative. She has iron deficiency anemia when last checked in March 2023  She has mitral valve regurgitation and she saw Dr. Bettina Gavia and a TEE is pending very shortly.  She does admit to history of palpitations.  She does not recall having MRI of the heart or some kind of arrhythmia monitoring program.  I have written to Dr. Bettina Gavia about this.  We discussed the fact that she has progressive pulm sarcoidosis symptoms and the prednisone is causing side effects and also in addition with the prednisone taper she is having more symptoms.  Last visit we discussed the second line methotrexate therapy.  She is open to the idea.  We went over the side effects in detail including common and rare side effects and the requirement for significant amount of blood monitoring.  She is agreeable to this.  We discussed referral to [facility] or [facility] for clinical trial participation by [employer] but she is not interested.  We discussed about seeing Dr. Rodman Pickle in our practice resident sarcoid expert but at this point in time she wants to hold off.  PFT     Latest Ref Rng & Units 05/14/2022    3:33 PM 01/25/2022   12:10 PM  PFT Results  FVC-Pre L 1.95  P 2.30   FVC-Predicted Pre % 53  P 76   FVC-Post L  2.16   FVC-Predicted Post %  71   Pre FEV1/FVC % % 63  P 88   Post FEV1/FCV % %  85   FEV1-Pre L 1.22  P 2.02   FEV1-Predicted Pre % 41  P 82   FEV1-Post L  1.83   DLCO uncorrected ml/min/mmHg  15.20   DLCO UNC% %  70   DLCO corrected ml/min/mmHg   15.20   DLCO COR %Predicted %  70   DLVA Predicted %  122   TLC L  3.17   TLC % Predicted %  62   RV % Predicted %  54     P Preliminary result       has a past medical history of Abnormal CT scan of lung (07/13/2020), Abnormal finding on imaging of liver (07/13/2020), Anemia, BACK PAIN, UPPER (07/27/2010), BRONCHITIS, ACUTE (07/16/2010), CELLULITIS, GREAT TOE (01/08/2010), Enlarged uterus (05/13/2011), Essential hypertension (09/07/2009), HEADACHE (08/03/2010), HYPERLIPIDEMIA (10/11/2009), Hypertension (2010), INGROWN NAIL (02/08/2010), Iron deficiency anemia due to chronic blood loss (06/20/2014), Kidney stone (10/30/2015), OBESITY (10/11/2009), PARESTHESIA (08/03/2010), Pre-diabetes, Renal disorder, SINUSITIS, ACUTE (12/07/2009), SORE THROAT (02/14/2010), and Vitamin D deficiency (07/13/2020).   reports that she has never smoked. She has never used smokeless tobacco.  Past Surgical History:  Procedure Laterality Date   BREAST REDUCTION SURGERY  1999   CESAREAN SECTION  2004   Cysts removed from chest wall     EYE SURGERY Left    MASS EXCISION N/A 04/14/2018   Procedure: EXCISION OF CHEST WALL MASS;  Surgeon: Coralie Keens, MD;  Location: [facility];  Service: General;  Laterality: N/A;    Allergies  Allergen Reactions   Multihance [Gadobenate] Nausea And Vomiting   Amlodipine Nausea Only   Collagenase Hives   Collagenase Clostridium Histolyticum Hives   Aspirin Rash    Immunization History  Administered Date(s) Administered   PFIZER(Purple Top)SARS-COV-2 Vaccination 12/30/2020, 01/20/2021, 06/30/2021   PPD Test 03/29/2020   Td 09/04/2009   Tdap 09/04/2009    Family History  Problem Relation Age of Onset   Hypertension Mother    Diabetes Mother    Hypertension Father    Stomach cancer Paternal Grandmother    Stomach cancer Maternal Uncle    Heart disease Maternal Uncle    Lung cancer Maternal Grandfather    Prostate cancer Maternal Uncle    Thyroid  disease Maternal Uncle      Current Outpatient Medications:    albuterol (PROVENTIL) (2.5 MG/3ML) 0.083% nebulizer solution, Take 3 mLs (2.5 mg total) by nebulization every 6 (six) hours as needed for wheezing or shortness of breath., Disp: 75 mL, Rfl: 5   albuterol (VENTOLIN HFA) 108 (90 Base) MCG/ACT inhaler, INHALE 2 PUFFS INTO THE LUNGS EVERY 6 HOURS AS NEEDED FOR WHEEZING OR SHORTNESS OF BREATH, Disp: 18 g, Rfl: 2   benzonatate (TESSALON) 100 MG capsule, TAKE 1 CAPSULE(100 MG) BY MOUTH THREE TIMES DAILY AS NEEDED FOR COUGH, Disp: 40 capsule, Rfl: 2   BEPREVE 1.5 % SOLN, Place 1 drop into both eyes daily., Disp: 10 mL, Rfl: 5   cyclobenzaprine (FLEXERIL) 5 MG tablet, TAKE 1 TO 2 TABLETS BY MOUTH EVERY NIGHT AS NEEDED FOR MUSCLE SPASMS, Disp: 60 tablet, Rfl: 2   ferrous sulfate (SLOW IRON) 160 (50 Fe) MG TBCR SR tablet, Take 1 tablet (160 mg total) by mouth daily., Disp: 30 tablet, Rfl: 3   fluticasone (CUTIVATE) 0.05 % cream, Apply topically 2 (two) times daily., Disp: 15 g, Rfl: 0   Norgestimate-Ethinyl Estradiol Triphasic (TRI-LO-SPRINTEC) 0.18/0.215/0.25 MG-25 MCG tab, TAKE 1 TABLET BY MOUTH DAILY, Disp: 28 tablet, Rfl: 6   omeprazole (PRILOSEC) 40 MG capsule, TAKE 1 CAPSULE(40 MG) BY MOUTH DAILY, Disp: 30 capsule, Rfl: 2   predniSONE (DELTASONE) 10 MG tablet, Take 10 mg by mouth daily with breakfast., Disp: , Rfl:       Objective:   Vitals:   05/14/22 1612  BP: (!) 140/100  Pulse: (!) 109  SpO2: 97%  Weight: 268 lb (121.6 kg)  Height: 5' 4" (1.626 m)    Estimated body mass index is 46 kg/m as calculated from the following:   Height as of this encounter: 5' 4" (1.626 m).   Weight as of this encounter: 268 lb (121.6 kg).  _0 @  Filed Weights   05/14/22 1612  Weight: 268 lb (121.6 kg)     Physical Exam  General: No distress. Looks we;; Neuro: Alert and Oriented x 3. GCS 15. Speech normal Psych: Pleasant Resp:  Barrel Chest - no.  Wheeze - no, Crackles -  no, No overt respiratory distress CVS: Normal heart sounds. Murmurs - no Ext: Stigmata of Connective Tissue Disease - no HEENT: Normal upper airway. PEERL +. No post nasal drip SKIN: Some scattered dark pigmented lesions in the dorsum of the hand and also face.        Assessment:       ICD-10-CM   1. Sarcoidosis  D86.9     2. Palpitations  R00.2     3. Medication monitoring encounter  Z51.81  Plan:     Patient Instructions  Sarcoidosis Chronic cough Dyspnea on exertion Mediastinal adenopathy Current use of chronic steroids   - Features are consistent with  sarcoidosis is extremely active - Improved after prednisone but worse with prednisone taper to 48m per day - PFT worse but could be due to cough +/- sarcoid worening - safety labs - normal G6PDH May 2023 and QPercival SpanishMarch 2023  Plan  - check cbc, bmet, lft 05/14/2022 - check HgbA1c 05/14/2022 - CXR  05/14/2022 OR next visit -prednisone 466mdaily x 10 days -> then 3026maily x 10 days, then 5m69mily x 10 days and then 10mg56mly to continue - start methotrexate  oral 10mg 45m a week on Sundays or Mondays -refer Devki Knox Salivaethotrexate counseling and monitoring - at some point if needed we can take 2nd opinoon from Dr JAne EDaiva Hugearcoid expert - will need RSV, flu covid vaccines inf all 2023  Mitral valve regurgitation - moderate and Mild aortic dilatation - echo new findings may 2023 Hx of palpitations  Plan  -TEE per Dr MunleyBettina Gavial email him about getting heart monitor for palpitations  Anemia, unspecified type -noted in October 2021. Persistent on March 2023  Seems like Iron Deficiency anemia  Plan -recheck cbc 05/14/2022     Rheumatoid factor positive -mildly positive in 2021 and persistent though March 2023 Associted arthralgia  - can be sarcoid arthritis versus RA   Plan  - monitor -  - at some point can decide on rheumatology referral   Abnormal CT of liver  along with MRI of the liver and spleen in 2021 ad in CT March 2023  -  understand last visit you want to stagger visits due to your work scheduled  Plan - Refer to gastroenterology some time in future  Pigmented skin lesions  -Very suspicious of sarcoidosis.  - Imrpoved with steroids bu worse again 05/14/2022 with prednisone taper  Plan - Refer to dermatology some poitn in future if needed  Follow-up - 8 weeks with Dr RamaswChase Callermin visit   High complex condition requiring intensive therapeutic monitoring SIGNATURE    Dr. MuraliBrand Males, F.C.C.P,  Pulmonary and Critical Care Medicine Staff Physician, [facility]  [facility]  Pulmonary [place] [address]: 336 [phone] answer or between  15:00h - 7:00h: call [phone] Telephone: [phone]  5:02 PM 05/14/2022

## 2022-05-15 ENCOUNTER — Telehealth: Payer: Self-pay | Admitting: Pharmacist

## 2022-05-15 ENCOUNTER — Encounter: Payer: Self-pay | Admitting: Family Medicine

## 2022-05-15 DIAGNOSIS — Z5181 Encounter for therapeutic drug level monitoring: Secondary | ICD-10-CM

## 2022-05-15 DIAGNOSIS — Z79899 Other long term (current) drug therapy: Secondary | ICD-10-CM

## 2022-05-15 DIAGNOSIS — D869 Sarcoidosis, unspecified: Secondary | ICD-10-CM

## 2022-05-15 DIAGNOSIS — Z7189 Other specified counseling: Secondary | ICD-10-CM

## 2022-05-15 MED ORDER — FOLIC ACID 1 MG PO TABS: 1.0000 mg | ORAL_TABLET | Freq: Every day | ORAL | 3 refills | Status: DC

## 2022-05-15 NOTE — Telephone Encounter (Signed)
Pharmacy Note  Subjective: Patient presents today to [facility] for follow up office visit. Patient seen by the pharmacist for counseling on methotrexate for sarcoidosis.   Objective: CBC    Component Value Date/Time   WBC 8.5 11/22/2021 0955   RBC 4.68 11/22/2021 0955   HGB 9.8 (L) 11/22/2021 0955   HCT 30.3 (L) 11/22/2021 0955   PLT 367.0 11/22/2021 0955   MCV 64.9 aL (L) 11/22/2021 0955   MCH 20.2 (L) 09/29/2017 1420   MCHC 32.2 11/22/2021 0955   RDW 19.2 (H) 11/22/2021 0955   LYMPHSABS 1.6 R 11/22/2021 0955   MONOABS 0.8 R 11/22/2021 0955   EOSABS 0.1 R 11/22/2021 0955   BASOSABS 0.1 R 11/22/2021 0955    CMP     Component Value Date/Time   NA 134 (L) 11/22/2021 0955   K 4.0 11/22/2021 0955   CL 99 11/22/2021 0955   CO2 27 11/22/2021 0955   GLUCOSE 90 11/22/2021 0955   BUN 15 11/22/2021 0955   CREATININE 1.12 11/22/2021 0955   CREATININE 1.14 (H) 09/29/2017 1420   CALCIUM 9.5 11/22/2021 0955   PROT 8.4 (H) 11/22/2021 0955   ALBUMIN 3.7 11/22/2021 0955   AST 28 11/22/2021 0955   ALT 19 11/22/2021 0955   ALKPHOS 136 (H) 11/22/2021 0955   BILITOT 0.5 11/22/2021 0955   GFRNONAA >60 12/26/2015 0800   GFRAA >60 12/26/2015 0800    Baseline Immunosuppressant Therapy Labs TB GOLD    Latest Ref Rng & Units 11/22/2021    9:55 AM  Quantiferon TB Gold  Quantiferon TB Gold Plus NEGATIVE NEGATIVE    Hepatitis Panel    Latest Ref Rng & Units 10/13/2017    4:54 PM  Hepatitis  Hep C Ab NON-REACTI NON-REACTIVE    HIV Lab Results  Component Value Date   HIV NON-REACTIVE 10/13/2017   Immunoglobulins   SPEP    Latest Ref Rng & Units 11/22/2021    9:55 AM  Serum Protein Electrophoresis  Total Protein 6.0 - 8.3 g/dL 8.4    G6PD Lab Results  Component Value Date   G6PDH 26.0 (H) 01/28/2022   TPMT No results found for: "TPMT"   Chest-xray:  12/12/21 - There has been dramatic increase in parenchymal lung involvement with extensive progressive  perilymphatic nodularity, as detailed above, compatible with worsening sarcoidosis.  Contraception: oral contraception  Alcohol use: none  Assessment/Plan:   Patient was counseled on the purpose, proper use, and adverse effects of methotrexate including nausea, infection, and signs and symptoms of pneumonitis. Discussed that there is the possibility of an increased risk of malignancy, specifically lymphomas, but it is not well understood if this increased risk is due to the medication or the disease state.  Instructed patient that medication should be held for infection and prior to surgery.  Advised patient to avoid live vaccines. Recommend annual influenza, Pneumovax 23, Prevnar 13, and Shingrix as indicated.   Reviewed instructions with patient to take methotrexate weekly along with folic acid daily.  Discussed the importance of frequent monitoring of kidney and liver function and blood counts, and provided patient with standing lab instructions.  Counseled patient to avoid NSAIDs and alcohol while on methotrexate.  Provided patient with educational materials on methotrexate and answered all questions.   Patient voiced understanding.  Patient consented to methotrexate use.  Will upload into chart.    She will need to update CBC, CMET prior to starting methotrexate. Labs were not drawn after OV on 05/14/22. ATC patient.  Mailbox is full  Dose of methotrexate will be '10mg'$  once weekly along with folic acid '1mg'$  daily for 2 weeks. Will repeat labs in 2 weeks prior to increasing dose to '15mg'$  once weekly. Goal dose would be MTX '20mg'$  once weekly. She will have increase folic acid intake to '2mg'$  daily once her MTX dose is at '15mg'$  weekly.   She will complete prednisone taper as follows: '40mg'$  daily x 10 days -> then '30mg'$  daily x 10 days, then '20mg'$  daily x 10 days and then '10mg'$  daily to continue  Knox Saliva, PharmD, MPH, BCPS, CPP Clinical Pharmacist (Rheumatology and Pulmonology)

## 2022-05-16 ENCOUNTER — Ambulatory Visit (HOSPITAL_BASED_OUTPATIENT_CLINIC_OR_DEPARTMENT_OTHER)
Admission: RE | Admit: 2022-05-16 | Discharge: 2022-05-16 | Disposition: A | Discharge status: Home / Self Care | Payer: 59 | Source: Ambulatory Visit | Attending: Cardiology | Admitting: Cardiology

## 2022-05-16 ENCOUNTER — Other Ambulatory Visit: Payer: Self-pay

## 2022-05-16 ENCOUNTER — Ambulatory Visit (HOSPITAL_COMMUNITY)
Admission: RE | Admit: 2022-05-16 | Discharge: 2022-05-16 | Disposition: A | Discharge status: Home / Self Care | Payer: 59 | Source: Ambulatory Visit | Attending: Internal Medicine | Admitting: Internal Medicine

## 2022-05-16 ENCOUNTER — Ambulatory Visit (HOSPITAL_BASED_OUTPATIENT_CLINIC_OR_DEPARTMENT_OTHER): Payer: 59 | Admitting: Anesthesiology

## 2022-05-16 ENCOUNTER — Encounter (HOSPITAL_COMMUNITY): Payer: Self-pay | Admitting: Internal Medicine

## 2022-05-16 ENCOUNTER — Encounter (HOSPITAL_COMMUNITY)
Admission: RE | Disposition: A | Discharge status: Home / Self Care | Payer: Self-pay | Source: Ambulatory Visit | Attending: Internal Medicine

## 2022-05-16 ENCOUNTER — Ambulatory Visit (HOSPITAL_COMMUNITY): Payer: 59 | Admitting: Anesthesiology

## 2022-05-16 ENCOUNTER — Encounter: Payer: Self-pay | Admitting: Cardiology

## 2022-05-16 DIAGNOSIS — D869 Sarcoidosis, unspecified: Secondary | ICD-10-CM | POA: Insufficient documentation

## 2022-05-16 DIAGNOSIS — I340 Nonrheumatic mitral (valve) insufficiency: Secondary | ICD-10-CM

## 2022-05-16 DIAGNOSIS — Z6841 Body Mass Index (BMI) 40.0 and over, adult: Secondary | ICD-10-CM | POA: Diagnosis not present

## 2022-05-16 DIAGNOSIS — I10 Essential (primary) hypertension: Secondary | ICD-10-CM

## 2022-05-16 HISTORY — PX: TEE WITHOUT CARDIOVERSION: SHX5443

## 2022-05-16 HISTORY — PX: BUBBLE STUDY: SHX6837

## 2022-05-16 LAB — ECHO TEE
MV M vel: 6.3 m/s
MV Peak grad: 158.5 mmHg
Radius: 0.4 cm

## 2022-05-16 SURGERY — ECHOCARDIOGRAM, TRANSESOPHAGEAL
Anesthesia: Monitor Anesthesia Care

## 2022-05-16 MED ORDER — PROPOFOL 500 MG/50ML IV EMUL
INTRAVENOUS | Status: DC | PRN
  Administered 2022-05-16: 150 ug/kg/min via INTRAVENOUS

## 2022-05-16 MED ORDER — GLYCOPYRROLATE PF 0.2 MG/ML IJ SOSY
PREFILLED_SYRINGE | INTRAMUSCULAR | Status: DC | PRN
  Administered 2022-05-16: .1 mg via INTRAVENOUS

## 2022-05-16 MED ORDER — SODIUM CHLORIDE 0.9 % IV SOLN
INTRAVENOUS | Status: AC | PRN
  Administered 2022-05-16: 500 mL via INTRAVENOUS

## 2022-05-16 MED ORDER — LABETALOL HCL 5 MG/ML IV SOLN
INTRAVENOUS | Status: DC | PRN
  Administered 2022-05-16: 5 mg via INTRAVENOUS

## 2022-05-16 MED ORDER — PROPOFOL 10 MG/ML IV BOLUS
INTRAVENOUS | Status: DC | PRN
  Administered 2022-05-16: 20 mg via INTRAVENOUS
  Administered 2022-05-16: 50 mg via INTRAVENOUS

## 2022-05-16 NOTE — Anesthesia Preprocedure Evaluation (Signed)
Anesthesia Evaluation  Patient identified by MRN, date of birth, ID band Patient awake    Reviewed: Allergy & Precautions, NPO status , Patient's Chart, lab work & pertinent test results  History of Anesthesia Complications Negative for: history of anesthetic complications  Airway Mallampati: I  TM Distance: >3 FB Neck ROM: Full    Dental  (+) Teeth Intact, Dental Advisory Given   Pulmonary shortness of breath,    breath sounds clear to auscultation       Cardiovascular hypertension, + Valvular Problems/Murmurs MR  Rhythm:Regular  1. Mitral regurgitation worse compared to previous.  2. Left ventricular ejection fraction, by estimation, is 55 to 60%. The  left ventricle has normal function. The left ventricle has no regional  wall motion abnormalities. There is mild left ventricular hypertrophy.  Left ventricular diastolic parameters  are indeterminate. The average left ventricular global longitudinal strain  is -18.9 %. The global longitudinal strain is normal.  3. Right ventricular systolic function is normal. The right ventricular  size is normal.  4. The mitral valve is normal in structure. Moderate to severe mitral  valve regurgitation. No evidence of mitral stenosis.  5. The aortic valve is tricuspid. Aortic valve regurgitation is not  visualized. No aortic stenosis is present.  6. Aortic dilatation noted. There is mild dilatation of the ascending  aorta, measuring 43 mm.  7. The inferior vena cava is normal in size with greater than 50%  respiratory variability, suggesting right atrial pressure of 3 mmHg.    Neuro/Psych  Headaches, negative psych ROS   GI/Hepatic Neg liver ROS, GERD  Medicated and Controlled,  Endo/Other  Morbid obesityPrednisone 51m (upped recently for sarcoid flare)  Renal/GU Lab Results      Component                Value               Date                      CREATININE                1.12                11/22/2021                Musculoskeletal   Abdominal   Peds  Hematology  (+) Blood dyscrasia, anemia , Lab Results      Component                Value               Date                      WBC                      8.5                 11/22/2021                HGB                      9.8 (L)             11/22/2021                HCT  30.3 (L)            11/22/2021                MCV                      64.9 aL (L)         11/22/2021                PLT                      367.0               11/22/2021              Anesthesia Other Findings   Reproductive/Obstetrics                             Anesthesia Physical Anesthesia Plan  ASA: 3  Anesthesia Plan: MAC   Post-op Pain Management: Minimal or no pain anticipated   Induction: Intravenous  PONV Risk Score and Plan: 2 and Propofol infusion and Ondansetron  Airway Management Planned: Nasal Cannula and Natural Airway  Additional Equipment:   Intra-op Plan:   Post-operative Plan:   Informed Consent: I have reviewed the patients History and Physical, chart, labs and discussed the procedure including the risks, benefits and alternatives for the proposed anesthesia with the patient or authorized representative who has indicated his/her understanding and acceptance.     Dental advisory given  Plan Discussed with: CRNA  Anesthesia Plan Comments:         Anesthesia Quick Evaluation

## 2022-05-16 NOTE — Anesthesia Procedure Notes (Signed)
Procedure Name: General with mask airway Date/Time: 05/16/2022 7:45 AM  Performed by: Erick Colace, CRNAPre-anesthesia Checklist: Patient identified, Emergency Drugs available, Suction available and Patient being monitored Patient Re-evaluated:Patient Re-evaluated prior to induction Oxygen Delivery Method: Simple face mask Preoxygenation: Pre-oxygenation with 100% oxygen Induction Type: IV induction Airway Equipment and Method: Bite block Dental Injury: Teeth and Oropharynx as per pre-operative assessment

## 2022-05-16 NOTE — Transfer of Care (Signed)
Immediate Anesthesia Transfer of Care Note  Patient: Ashley Henry  Procedure(s) Performed: TRANSESOPHAGEAL ECHOCARDIOGRAM (TEE) BUBBLE STUDY  Patient Location: Short Stay  Anesthesia Type:MAC  Level of Consciousness: awake  Airway & Oxygen Therapy: Patient Spontanous Breathing  Post-op Assessment: Report given to RN and Post -op Vital signs reviewed and stable  Post vital signs: Reviewed and stable  Last Vitals:  Vitals Value Taken Time  BP 147/101 05/16/22 0826  Temp 36.6 C 05/16/22 0824  Pulse 107 05/16/22 0825  Resp 38 05/16/22 0825  SpO2 99 % 05/16/22 0825  Vitals shown include unvalidated device data.  Last Pain:  Vitals:   05/16/22 0824  TempSrc: Temporal  PainSc: 0-No pain         Complications: No notable events documented.

## 2022-05-16 NOTE — Progress Notes (Signed)
  Echocardiogram Echocardiogram Transesophageal has been performed.  Ashley Henry 05/16/2022, 8:55 AM

## 2022-05-16 NOTE — Discharge Instructions (Signed)
TEE  YOU HAD AN CARDIAC PROCEDURE TODAY: Refer to the procedure report and other information in the discharge instructions given to you for any specific questions about what was found during the examination. If this information does not answer your questions, please call [facility] office at [phone] to clarify.   DIET: Your first meal following the procedure should be a light meal and then it is ok to progress to your normal diet. A half-sandwich or bowl of soup is an example of a good first meal. Heavy or fried foods are harder to digest and may make you feel nauseous or bloated. Drink plenty of fluids but you should avoid alcoholic beverages for 24 hours. If you had a esophageal dilation, please see attached instructions for diet.   ACTIVITY: Your care partner should take you home directly after the procedure. You should plan to take it easy, moving slowly for the rest of the day. You can resume normal activity the day after the procedure however YOU SHOULD NOT DRIVE, use power tools, machinery or perform tasks that involve climbing or major physical exertion for 24 hours (because of the sedation medicines used during the test).   SYMPTOMS TO REPORT IMMEDIATELY: A cardiologist can be reached at any hour. Please call [phone] for any of the following symptoms:  Vomiting of blood or coffee ground material  New, significant abdominal pain  New, significant chest pain or pain under the shoulder blades  Painful or persistently difficult swallowing  New shortness of breath  Black, tarry-looking or red, bloody stools  FOLLOW UP:  Please also call with any specific questions about appointments or follow up tests. TEE  YOU HAD AN CARDIAC PROCEDURE TODAY: Refer to the procedure report and other information in the discharge instructions given to you for any specific questions about what was found during the examination. If this information does not answer your questions, please call [facility] office at [phone] to clarify.   DIET: Your first meal following the procedure should be a light meal and then it is ok to progress to your normal diet. A half-sandwich or bowl of soup is an example of a good first meal. Heavy or fried foods are harder to digest and may make you feel nauseous or bloated. Drink plenty of fluids but you should avoid alcoholic beverages for 24 hours. If you had a esophageal dilation, please see attached instructions for diet.   ACTIVITY: Your care partner should take you home directly after the procedure. You should plan to take it easy, moving slowly for the rest of the day. You can resume normal activity the day after the procedure however YOU SHOULD NOT DRIVE, use power tools, machinery or perform tasks that involve climbing or major physical exertion for 24 hours (because of the sedation medicines used during the test).   SYMPTOMS TO REPORT IMMEDIATELY: A cardiologist can be reached at any hour. Please call [phone] for any of the following symptoms:  Vomiting of blood or coffee ground material  New, significant abdominal pain  New, significant chest pain or pain under the shoulder blades  Painful or persistently difficult swallowing  New shortness of breath  Black, tarry-looking or red, bloody stools  FOLLOW UP:  Please also call with any specific questions about appointments or follow up tests.  

## 2022-05-16 NOTE — Interval H&P Note (Signed)
History and Physical Interval Note:  05/16/2022 7:30 AM  Ashley Henry  has presented today for surgery, with the diagnosis of MITRAL REGURGITATION.  The various methods of treatment have been discussed with the patient and family. After consideration of risks, benefits and other options for treatment, the patient has consented to  Procedure(s): TRANSESOPHAGEAL ECHOCARDIOGRAM (TEE) (N/A) as a surgical intervention.  The patient's history has been reviewed, patient examined, no change in status, stable for surgery.  I have reviewed the patient's chart and labs.  Questions were answered to the patient's satisfaction.     Dorris Carnes

## 2022-05-16 NOTE — CV Procedure (Signed)
TEE  INdication:   Mitral regurgitation  Patient sedated by anesthesia with propofol intravenously    Mouth guard placed TEE probe advanced to mid esophagus without difficulty  Full report to follow in CV section of chart       Procedure was without complication  Dorris Carnes MD

## 2022-05-17 ENCOUNTER — Other Ambulatory Visit: Payer: Self-pay

## 2022-05-17 MED ORDER — NORGESTIM-ETH ESTRAD TRIPHASIC 0.18/0.215/0.25 MG-25 MCG PO TABS: 1.0000 | ORAL_TABLET | Freq: Every day | ORAL | 3 refills | Status: AC

## 2022-05-17 NOTE — Telephone Encounter (Signed)
Thank you. Appreciate it.

## 2022-05-19 ENCOUNTER — Encounter (HOSPITAL_COMMUNITY): Payer: Self-pay | Admitting: Internal Medicine

## 2022-05-21 NOTE — Telephone Encounter (Signed)
ATC patient regarding labwork needed prior to starting MTX. Unable to reach. Left vm  Knox Saliva, PharmD, MPH, BCPS, CPP Clinical Pharmacist (Rheumatology and Pulmonology)

## 2022-05-23 NOTE — Anesthesia Postprocedure Evaluation (Signed)
Anesthesia Post Note  Patient: Ashley Henry  Procedure(s) Performed: TRANSESOPHAGEAL ECHOCARDIOGRAM (TEE) BUBBLE STUDY     Patient location during evaluation: Endoscopy Anesthesia Type: MAC Level of consciousness: patient cooperative and awake Pain management: pain level controlled Vital Signs Assessment: post-procedure vital signs reviewed and stable Respiratory status: spontaneous breathing, nonlabored ventilation, respiratory function stable and patient connected to nasal cannula oxygen Cardiovascular status: stable and blood pressure returned to baseline Postop Assessment: no apparent nausea or vomiting Anesthetic complications: no   No notable events documented.  Last Vitals:  Vitals:   05/16/22 0834 05/16/22 0850  BP: (!) 156/107 (!) 149/100  Pulse: 98 97  Resp:    Temp:    SpO2:      Last Pain:  Vitals:   05/16/22 0824  TempSrc: Temporal  PainSc: 0-No pain                 Kaydan Wilhoite

## 2022-05-27 NOTE — Telephone Encounter (Signed)
ATC #3 to review labs needed. Lfet detailed VM.  Discontinued rx at [facility] since labs are required to move forward with taking MTX.   Knox Saliva, PharmD, MPH, BCPS, CPP Clinical Pharmacist (Rheumatology and Pulmonology)

## 2022-05-29 ENCOUNTER — Telehealth: Payer: 59 | Admitting: Pharmacist

## 2022-06-04 NOTE — Telephone Encounter (Signed)
Patient has not returned calls and has read my MyChart message but failed to respond. Spoke with Dr. Chase Caller  on 06/03/22 and advised that rx for MTX was discontinued at pharmacy as pt has not completed labwork. He was in agreement.  Closing this encounter.  All labs that are needed are in place as future orders. If further assistance is needed from pharmacy team, we are happy to help  Knox Saliva, PharmD, MPH, BCPS, CPP Clinical Pharmacist (Rheumatology and Pulmonology)

## 2022-06-08 ENCOUNTER — Encounter: Payer: Self-pay | Admitting: Internal Medicine

## 2022-06-09 ENCOUNTER — Other Ambulatory Visit: Payer: Self-pay | Admitting: Internal Medicine

## 2022-06-10 ENCOUNTER — Other Ambulatory Visit: Payer: Self-pay | Admitting: Family Medicine

## 2022-06-11 ENCOUNTER — Telehealth: Payer: Self-pay | Admitting: Internal Medicine

## 2022-06-11 NOTE — Telephone Encounter (Signed)
Dear Ms Scheid  Thank you for your message.  I could not reply yesterday because of post night call and return to work today.  I respected physician that you are not able to do the blood work prior to starting methotrexate.  Our apologies that we could not execute getting the blood work done few in a timely fashion at the last visit.  At this point I agree with you that we would wait on the methotrexate till we get the safety labs at the time of next visit.  Meanwhile yes we will refill the prednisone    Also with the MyChart messages the office usually has someone triaging the messages before they send it to me.  I do not have direct access to the messages.  This is because of high volume of the messages and routing policies at the practice.  Thank you for your attention     SIGNATURE    Dr. Brand Males, M.D., F.C.C.P,  Pulmonary and Critical Care Medicine Staff Physician, [place] Director - Interstitial Lung Disease  Program  Medical Director - [facility] [place] [facility] [place], [place], [address]  NPI Number:  NPI #[id number] [facility] Number: [id number]  Pager: [phone], If no answer  -> Check AMION or Try [place] Telephone (clinical office): [phone] Telephone (research): [phone]  12:41 PM 06/11/2022'

## 2022-06-11 NOTE — Telephone Encounter (Signed)
Mychart message sent to pt. Nothing further needed.

## 2022-07-09 ENCOUNTER — Encounter: Payer: Self-pay | Admitting: Internal Medicine

## 2022-07-09 NOTE — Progress Notes (Signed)
 This encounter was created in error - please disregard.

## 2022-07-09 NOTE — Patient Instructions (Signed)
Sarcoidosis Chronic cough Dyspnea on exertion Mediastinal adenopathy Current use of chronic steroids   - Features are consistent with  sarcoidosis is extremely active - Improved after prednisone but worse with prednisone taper to '10mg'$  per day - PFT worse but could be due to cough +/- sarcoid worening - safety labs - normal G6PDH May 2023 and Percival Spanish March 2023  Plan  - check cbc, bmet, lft 05/14/2022 - check HgbA1c 05/14/2022 - CXR  05/14/2022 OR next visit -prednisone '40mg'$  daily x 10 days -> then '30mg'$  daily x 10 days, then '20mg'$  daily x 10 days and then '10mg'$  daily to continue - start methotrexate  oral '10mg'$  once a week on Sundays or Mondays -refer Knox Saliva for methotrexate counseling and monitoring - at some point if needed we can take 2nd opinoon from Dr Daiva Huge our sarcoid expert - will need RSV, flu covid vaccines inf all 2023  Mitral valve regurgitation - moderate and Mild aortic dilatation - echo new findings may 2023 Hx of palpitations  Plan  -TEE per Dr Bettina Gavia - will email him about getting heart monitor for palpitations  Anemia, unspecified type -noted in October 2021. Persistent on March 2023  Seems like Iron Deficiency anemia  Plan -recheck cbc 05/14/2022     Rheumatoid factor positive -mildly positive in 2021 and persistent though March 2023 Associted arthralgia  - can be sarcoid arthritis versus RA   Plan  - monitor -  - at some point can decide on rheumatology referral   Abnormal CT of liver along with MRI of the liver and spleen in 2021 ad in CT March 2023  -  understand last visit you want to stagger visits due to your work scheduled  Plan - Refer to gastroenterology some time in future  Pigmented skin lesions  -Very suspicious of sarcoidosis.  - Imrpoved with steroids bu worse again 05/14/2022 with prednisone taper  Plan - Refer to dermatology some poitn in future if needed  Follow-up - 8 weeks with Dr Chase Caller - 30 min visit

## 2022-07-19 ENCOUNTER — Other Ambulatory Visit: Payer: Self-pay | Admitting: Family Medicine

## 2022-07-30 ENCOUNTER — Ambulatory Visit: Payer: Commercial Managed Care - HMO | Admitting: Cardiology

## 2022-08-07 ENCOUNTER — Other Ambulatory Visit: Payer: Self-pay | Admitting: Family Medicine

## 2022-08-15 ENCOUNTER — Encounter: Payer: Self-pay | Admitting: Internal Medicine

## 2022-08-15 ENCOUNTER — Ambulatory Visit (INDEPENDENT_AMBULATORY_CARE_PROVIDER_SITE_OTHER): Payer: Self-pay | Admitting: Internal Medicine

## 2022-08-15 VITALS — BP 138/72 | HR 114 | Temp 98.4°F | Ht 64.0 in | Wt 269.2 lb

## 2022-08-15 DIAGNOSIS — L819 Disorder of pigmentation, unspecified: Secondary | ICD-10-CM

## 2022-08-15 DIAGNOSIS — R0609 Other forms of dyspnea: Secondary | ICD-10-CM

## 2022-08-15 DIAGNOSIS — R053 Chronic cough: Secondary | ICD-10-CM

## 2022-08-15 DIAGNOSIS — D869 Sarcoidosis, unspecified: Secondary | ICD-10-CM

## 2022-08-15 DIAGNOSIS — R590 Localized enlarged lymph nodes: Secondary | ICD-10-CM

## 2022-08-15 NOTE — Patient Instructions (Addendum)
Sarcoidosis Chronic cough Dyspnea on exertion Mediastinal adenopathy Current use of chronic steroids   - Improved on prednisone '10mg'$  per day but for early morning cough  - [place] shared understanding that we are at a good point  Plan  - prednisone '10mg'$  daily to continue -hold off methotrexate or referral to [facility] for clinical trials - respect deferral on flu vaccine   Anemia, unspecified type -noted in October 2021. Persistent on March 2023  Seems like Iron Deficiency anemia  Plan -recheck cbc 08/15/2022 if you wish or you can d/w PCP Burchette, Alinda Sierras, MD      Rheumatoid factor positive -mildly positive in 2021 and persistent though March 2023 Associted arthralgia  - can be sarcoid arthritis versus RA but glad joint pain improved   Plan  - monitor -  - at some point can decide on rheumatology referral   Abnormal CT of liver along with MRI of the liver and spleen in 2021 ad in CT March 2023  -  understand last visit you want to stagger visits due to your work scheduled  Plan - Refer to gastroenterology some time in future  Pigmented skin lesions  -Very suspicious of sarcoidosis.  - Imrpoved with steroids bu worse again 05/14/2022 and improved (resolved in face but preesent I fingers though better) 08/15/2022 with prednisone 10 mg per day  Plan - Refer to dermatology - [place] or [place]  Follow-up - 8 weeks with Dr Chase Caller - 30 min visit

## 2022-08-15 NOTE — Progress Notes (Signed)
OV 06/19/2020  Subjective:  Patient ID: Ashley Henry, female , DOB: September 27, 1975 , age 46 y.o. , MRN: [id number] , ADDRESS: [address] [place] [address]  PCP Ashley Henry Attending : Treatment Team:  Attending Provider: Brand Males, Henry    06/19/2020 -   Chief Complaint  Patient presents with   Consult    Patient is here for a productive/dry cough that she has had for several months with brown/yellow thivk sputum. Shortness of breath with exertion, stong smells, to hot.      HPI Ashley Henry 46 y.o. -referred by primary care physician Burchette, Alinda Sierras, Henry.  Patient is a psychotherapist.  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  Her 3 maternal uncles, her brother and her maternal aunt all have sarcoidosis.  Her uncles are deceased although from other reasons.  Her brother and her aunt are still living.  She tells me that in the last 6 months he has had insidious onset of chronic cough, shortness of breath, night sweats and chills.  These have persisted.  This is despite having an intentional 8 pound weight loss during this time.  She says cough is made worse by talking or exertion.  Similarly shortness of breath is made worse by exertion.  Also made worse by talking through the mask during counseling sessions.  Both are relieved by rest.  Cough is also relieved by not talking.  She does have nocturnal awakening with cough chest tightness and wheezing.  She also reports right infra axillary right upper quadrant pain with coughing.  She does feel chest tightness deep inside the chest.  She had CT scan of the chest that I personally visualized.  Shows mediastinal lymphadenopathy consistent with stage I sarcoidosis.  CT scan abdomen cuts on the chest CT show possible evidence of sarcoidosis in the liver.  MRI has been recommended.  There are associated palpitations and night sweats.  Cough is associated with sputum productin     IMPRESSION:  1.  Pulmonary parenchymal and thoracic nodal findings which are consistent with sarcoidosis. 2. Heterogeneous density throughout the liver. Possibly heterogeneous steatosis. Hepatic involvement of sarcoidosis could look similar. If there are right upper quadrant symptoms or abnormal liver function test, pre and post contrast abdominal MRI should be considered.     Electronically Signed   By: Ashley Henry M.D.   On: 05/29/2020 10:59  ROS - per HPI  Results for [name], [name] (MRN [id number]) as of 06/19/2020 09:03  Ref. Range 09/26/2017 14:27 10/13/2017 16:54 10/06/2018 14:47  Anti Nuclear Antibody (ANA) Latest Ref Range: NEGATIVE   NEGATIVE   SSA (Ro) (ENA) Antibody, IgG Latest Ref Range: <1.0 NEG AI <1.0 NEG    SSB (La) (ENA) Antibody, IgG Latest Ref Range: <1.0 NEG AI <1.0 NEG       07/13/2020 Follow up : Cough/? Sarcoid /Abnormal CT chest and MRI AB   46 year old female never smoker seen for pulmonary consult June 19, 2020 for 60-monthhistory of chronic cough shortness of breath night sweats and abnormal CT chest suspicious for sarcoidosis.  Patient has a very strong family history of sarcoidosis.  Patient presents for a 3-week follow-up.  Patient was seen last visit for pulmonary consult for a 649-monthistory of cough shortness of breath night sweats and chills.  She was seen by her primary care provider.  Subsequent CT chest May 29, 2020 showed pulmonary nodularity, possible bilateral bilateral hilar  adenopathy, liver abnormalities.  This was suspicious for possible sarcoidosis.  Lab work last visit showed an elevated ACE level at 82 and a high sed rate at greater than 130.  Her rheumatoid factor was positive but CCP was negative.  INR was slightly elevated at 1.1.  Patient anemia was stable with hemoglobin at 9.3.  Her platelets were normal.  Hepatic function testing was normal.  Patient was set up for an MRI abdomen that showed abnormalities with a hyperenhancing area in the medial  segment of the left hepatic lobe hepatic nodularity.  And splenic lesions.  There was suspicion for portal hypertension with possible varices versus a distal esophageal hyperenhancement.  There was no signs of steatosis. Patient has significant fatigue low energy decreased activity tolerance cough joint pains.  She has had intermittent rashes but has none currently.  She has had a recent eye exam with no reported abnormalities. We discussed all of her test and lab results. Has very strong family history of sarcoidosis with 3 maternal uncles, her brother and a maternal aunt that all have sarcoidosis. Patient has had intentional weight loss says that she has been on a diet and is down greater than 89 pounds. She denies any hemoptysis bloody stools hematemesis, bruising.  We had a long discussion regarding her abnormalities in her lab work and recent MRI of her liver and spleen.  We discussed the need to proceed with probable bronchoscopy with biopsy and referral to GI for consideration for further work-up of liver involvement and abnormalities. Unfortunately patient's medical insurance is going to stop July 16, 2020.  She declines flu shot and Covid vaccines  Patient works independently as a Management consultant.    TEST/EVENTS :  Chest x-ray September 29, 2017 clear lungs no acute process  Renal CT April 2017-no focal abnormalities noted in the liver, spleen or pancreas, nonobstructive left nephrolithiasis, moderate right hydroureter nephrosis  CT chest without contrast May 29, 2020 showed perilymphatic, peribronchovascular and subpleural distribution of pulmonary nodularity throughout right apical nodule 5 mm and nodularity along the left major fissure measuring 9 mm.  Heterogeneous density throughout the liver.,  Possible mild bilateral hilar adenopathy  MR abdomen July 08, 2020 hyperenhancing area in the medial segment of the left hepatic lobe, background of nodular hepatic contours and  hepatic heterogeneity atypical for sarcoid associated lesion given arterial enhancement other areas more typical for sarcoid but remain nonspecific.,  Splenic lesions noted.  Potential portal hypertension with varices versus distal esophageal hyperenhancement, no signs of steatosis  Labs June 19, 2020: QuantiFERON gold negative, Sjogren's negative, LFTs normal, rheumatoid factor positive, CCP negative, INR 1.1, allergy profile negative, IgE 67, Anti-DNA antibody negative, hepatic panel normal, platelets normal, WBC 7.1 ACE level elevated 82, sed rate greater than 130, vitamin D 8, hemoglobin 9.3 hematocrit 28  2D echo July 07, 2020 EF 55 to 60%, mild LVH, pulmonary artery pressure normal  Previous iron levels were noted low  HIV 2019 neg.   OV 11/22/2021  Subjective:  Patient ID: Ashley Henry, female , DOB: 08/25/1976 , age 6 y.o. , MRN: [id number] , ADDRESS: [address] [place] [address] PCP Ashley Henry Patient Care Team: Ashley Henry as PCP - General  This Provider for this visit: Treatment Team:  Attending Provider: Brand Males, Henry    11/22/2021 -   Chief Complaint  Patient presents with   Follow-up    Pt states she is coughing a lot each day and also  states that her skin has been breaking out. States first thing in the morning she will cough up a lot of phlegm. States that she does have increased SOB especially when exercising.     ICD-10-CM   1. Sarcoidosis  D86.9     2. Chronic cough  R05.3     3. Dyspnea on exertion  R06.09     4. Mediastinal adenopathy  R59.0     5. Vitamin D deficiency  E55.9     6. Anemia, unspecified type  D64.9     7. Rheumatoid factor positive  R76.8     8. Abnormal CT of liver  R93.2     9. Pigmented skin lesions  L81.9        HPI Ashley Henry 46 y.o. -returns for follow-up.  Last seen personally in 2021.  At that time the concern was all his symptoms are pointing toward sarcoidosis.  She  then saw a nurse practitioner Patricia Nettle in October 2021.  She was confirmed to have sarcoidosis clinically based on high angiotensin-converting enzyme, African-American ethnicity, age and symptoms and CT scan findings of the lung.  She was then prescribed prednisone for 3 months she says during this time all the symptoms resolved.  She did not have insurance so did not do any further work-up.  She says then early last year she ran out of prednisone and since then having significant symptoms.  The symptoms include significant amount of night sweats, shortness of breath and wheezing.  Also severe cough day and night.  In the early morning the cough is productive with yellow sputum but rest of the day the cough is dry.  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  She showed a voice recording of the cough and there is clear laryngeal component to the cough.  Symptoms are rated as severe.  Cough not responding to Tessalon cough.  During this time she had skin lesions and arthralgia.  The skin lesions on the hands forehead.  There is also drying of the palms.  There is arthralgia diffusely.  Vitamin D deficiency: This was diagnosed in 2021.  Not on treatment currently  Anemia: This was diagnosed in 2021.  Not on treatment currently   Liver lesion seen on MRI along with splenic lesions: These were reported as atypical for sarcoid.  She never saw a GI  Skin lesions: This is new since last visit.  She is open to seeing dermatologist.      OV 12/18/2021  Subjective:  Patient ID: Ashley Henry, female , DOB: 1976-03-15 , age 70 y.o. , MRN: [id number] , ADDRESS: [address] [place] [address] PCP Ashley Henry Patient Care Team: Ashley Henry as PCP - General    12/18/2021 -this video visit is to review test results done due to concern of sarcoidosis.   HPI Ashley Henry 46 y.o. -since her last visit a few weeks ago she continues to have  the same symptoms of arthralgia and shortness of breath.  And skin lesions.  Referred her to the dermatologist and GI because of concerns of splenic lesions.  She has not yet seen them.  At this point in time she is focused on getting through symptom relief.  We did do investigations and they are as follows  CT scan of the chest: Shows worsening sarcoidosis.  I visualized the film and also showed it to the video  Pulmonary function test: Not yet done  Blood work: Shows worsening angiotensin-converting enzyme level to greater than 180.  Persistently high ESR.Marland Kitchen  Rheumatoid factor continues to be trace positive.  Continues to be iron deficient anemic with a hemoglobin 9.9 g% which is baseline since 2014.  She says primary care physician has not yet addressed this.  IMPRESSION: 1. There has been dramatic increase in parenchymal lung involvement with extensive progressive perilymphatic nodularity, as detailed above, compatible with worsening sarcoidosis. 2. Multiple large low-attenuation splenic lesions, nonspecific, but likely reflective of sarcoid involvement in the spleen. 3. Dilatation of the pulmonic trunk (3.7 cm in diameter), concerning for pulmonary arterial hypertension. 4. Aortic atherosclerosis.   Aortic Atherosclerosis (ICD10-I70.0).     Electronically Signed   By: Vinnie Langton M.D.   On: 12/12/2021 07:41   01/28/2022 -   Chief Complaint  Patient presents with   Follow-up    PFT performed 01/25/22. Pt states she has been doing okay since last visit and denies any complaints.     HPI Ashley Henry 46 y.o. -last office visit April 2023 on video.  Presents with her daughter who is at the bedside.  Diagnosis worsening sarcoidosis symptomatic given.  Commenced on prednisone.  I wrote for prednisone taper of 40 mg x 2 weeks and then 20 mg x 2 weeks.  The pharmacist informed that I skipped a dose of 30 mg for 2 weeks.  Therefore she took 40 mg for 2 weeks and then 30 mg for 2  weeks.  She is currently on 20 mg daily and she is finished 1 week of that.  She wants to extend the taper and go to 10 mg/day and then continue that per instructions.  The prednisone is really helped shortness of breath is improved cough is improved.  Her skin rash in the face and hands have improved.  Nevertheless she is having prednisone side effects of increased hunger 10 pound weight gain decreased sleep increased alertness.  Also facial puffiness.  Nevertheless she is continuing with the prednisone and she is okay doing that.  We did get an echocardiogram.  Previous echocardiogram 2021 was normal.  Currently is reporting moderate to severe mitral regurgitation and rater than 4 cm aortic root dilatation.  I did inform her of these findings.  I also passed a message on to Dr. Bettina Gavia her cardiologist.  Pulmonary function shows mild restriction with mild reduction in diffusion capacity.    OV 05/14/2022  Subjective:  Patient ID: Ashley Henry, female , DOB: 1976/02/01 , age 6 y.o. , MRN: [id number] , ADDRESS: [address] [place] [address] PCP Ashley Henry Patient Care Team: Ashley Henry as PCP - General  This Provider for this visit: Treatment Team:  Attending Provider: Brand Males, Henry    05/14/2022 -   Chief Complaint  Patient presents with   Follow-up    PFT performed today.  Pt states she is about the same compared to last visit.   Follow-up pulmonary sarcoidosis last CT scan March 2023  HPI Ashley Henry 46 y.o. -returns for follow-up.  At the last visit her symptoms and the skin lesions were better after prednisone dosage.  We started tapering the prednisone.  Today she presents for follow-up.  She is now on 10 mg/day prednisone but she says with the taper cough is worse shortness of breath is worse wheezing is worse.  In addition skin lesions are also getting worse.  She  is still gaining weight though she states she is gained another 10  pounds of weight on the current 10 mg of prednisone.  She is frustrated by the worsening symptoms.  She did a pulmonary function test today.  This shows worsening of FVC and FEV1 however she did have significant amount of cough which is part of her symptoms from the sarcoid.  Therefore the results are somewhat unreliable.  Her daughter is here with her today.  She had normal G6PD testing are slightly high back in May 2023 March 2023 the QuantiFERON gold was negative. She has iron deficiency anemia when last checked in March 2023  She has mitral valve regurgitation and she saw Dr. Bettina Gavia and a TEE is pending very shortly.  She does admit to history of palpitations.  She does not recall having MRI of the heart or some kind of arrhythmia monitoring program.  I have written to Dr. Bettina Gavia about this.  We discussed the fact that she has progressive pulm sarcoidosis symptoms and the prednisone is causing side effects and also in addition with the prednisone taper she is having more symptoms.  Last visit we discussed the second line methotrexate therapy.  She is open to the idea.  We went over the side effects in detail including common and rare side effects and the requirement for significant amount of blood monitoring.  She is agreeable to this.  We discussed referral to [facility] or [facility] for clinical trial participation by [employer] but she is not interested.  We discussed about seeing Dr. Rodman Pickle in our practice resident sarcoid expert but at this point in time she wants to hold off.       OV 08/15/2022  Subjective:  Patient ID: Ashley Henry, female , DOB: 19-Jul-1976 , age 28 y.o. , MRN: [id number] , ADDRESS: [address] [place] [address] PCP Ashley Henry Patient Care Team: Ashley Henry as PCP - General  This Provider for this visit: Treatment Team:  Attending Provider: Brand Males, Henry  Follow-up pulmonary sarcoidosis last CT scan March  2023  08/15/2022 -   Chief Complaint  Patient presents with   Follow-up    Doing good with prednisone currently     HPI Ashley Henry 46 y.o. -returns for follow-up.  At this point in time she is back on prednisone 10 mg/day.  She finds this dose to be helpful for her.  Early in the morning she has severe cough that last 30 to 60 minutes and it has white sputum.  Then she takes a prednisone 10 mg/day and the cough goes away.  Overall quality of life is better no shortness of breath rest of the day there is no cough.  Her skin lesions in the face have resolved.  The skin lesions in the hands do persist but they are improved.  She is open to getting a dermatology referral right now.  She did have mitral regurgitation on the 2D echo when she had a TEE and it is only mild.  This was in August 2023 and I reviewed that result.  At this point in time she is content taking prednisone 10 mg/day for her sarcoid.  She does not monitor methotrexate.  In the past she declined to clinical trials.  Review of the labs indicate that she had anemia in March 2023.  I will will offer her repeat CBC testing.  We discussed vaccination she has had a COVID-vaccine but  not her flu.  She has never had a flu shot and does not intend to.     PFT     Latest Ref Rng & Units 05/14/2022    3:33 PM 01/25/2022   12:10 PM  PFT Results  FVC-Pre L 1.95  2.30   FVC-Predicted Pre % 53  76   FVC-Post L  2.16   FVC-Predicted Post %  71   Pre FEV1/FVC % % 63  88   Post FEV1/FCV % %  85   FEV1-Pre L 1.22  2.02   FEV1-Predicted Pre % 41  82   FEV1-Post L  1.83   DLCO uncorrected ml/min/mmHg  15.20   DLCO UNC% %  70   DLCO corrected ml/min/mmHg  15.20   DLCO COR %Predicted %  70   DLVA Predicted %  122   TLC L  3.17   TLC % Predicted %  62   RV % Predicted %  54        has a past medical history of Abnormal CT scan of lung (07/13/2020), Abnormal finding on imaging of liver (07/13/2020), Anemia, BACK PAIN, UPPER  (07/27/2010), BRONCHITIS, ACUTE (07/16/2010), CELLULITIS, GREAT TOE (01/08/2010), Enlarged uterus (05/13/2011), Essential hypertension (09/07/2009), HEADACHE (08/03/2010), HYPERLIPIDEMIA (10/11/2009), Hypertension (2010), INGROWN NAIL (02/08/2010), Iron deficiency anemia due to chronic blood loss (06/20/2014), Kidney stone (10/30/2015), OBESITY (10/11/2009), PARESTHESIA (08/03/2010), Pre-diabetes, Renal disorder, SINUSITIS, ACUTE (12/07/2009), SORE THROAT (02/14/2010), and Vitamin D deficiency (07/13/2020).   reports that she has never smoked. She has never used smokeless tobacco.  Past Surgical History:  Procedure Laterality Date   [place] STUDY  05/16/2022   Procedure: BUBBLE STUDY;  Surgeon: Fay Records, Henry;  Location: [facility] ENDOSCOPY;  Service: Cardiovascular;;   CESAREAN SECTION  2004   Cysts removed from chest wall     EYE SURGERY Left    MASS EXCISION N/A 04/14/2018   Procedure: EXCISION OF CHEST WALL MASS;  Surgeon: Coralie Keens, Henry;  Location: [facility];  Service: General;  Laterality: N/A;   TEE WITHOUT CARDIOVERSION N/A 05/16/2022   Procedure: TRANSESOPHAGEAL ECHOCARDIOGRAM (TEE);  Surgeon: Fay Records, Henry;  [facility] END[facility]COPY;  Service: Cardiovascular;  Laterality: N/A;    Allergies  Allergen Reactions   Multihance [Gadobenate] Nausea And Vomiting   Amlodipine Nausea Only   Collagenase Hives   Collagenase Clostridium Histolyticum Hives   Aspirin Rash    Immunization History  Administered Date(s) Administered   PFIZER(Purple Top)SARS-COV-2 Vaccination 12/30/2020, 01/20/2021, 06/30/2021   PPD Test 03/29/2020   Td 09/04/2009   Tdap 09/04/2009    Family History  Problem Relation Age of Onset   Hypertension Mother    Diabetes Mother    Hypertension Father    Stomach cancer Paternal Grandmother    Stomach cancer Maternal Uncle    Heart disease Maternal Uncle    Lung cancer Maternal Grandfather    Prostate cancer Maternal  Uncle    Thyroid disease Maternal Uncle      Current Outpatient Medications:    albuterol (PROVENTIL) (2.5 MG/3ML) 0.083% nebulizer solution, Take 3 mLs (2.5 mg total) by nebulization every 6 (six) hours as needed for wheezing or shortness of breath., Disp: 75 mL, Rfl: 5   albuterol (VENTOLIN HFA) 108 (90 Base) MCG/ACT inhaler, INHALE 2 PUFFS INTO THE LUNGS EVERY 6 HOURS AS NEEDED FOR WHEEZING OR SHORTNESS OF BREATH, Disp: 18 g, Rfl: 2   benzonatate (TESSALON) 100 MG capsule, TAKE 1  CAPSULE(100 MG) BY MOUTH THREE TIMES DAILY AS NEEDED FOR COUGH, Disp: 40 capsule, Rfl: 2   BEPREVE 1.5 % SOLN, Place 1 drop into both eyes daily., Disp: 10 mL, Rfl: 5   cyclobenzaprine (FLEXERIL) 5 MG tablet, TAKE 1 TO 2 TABLETS BY MOUTH EVERY NIGHT AS NEEDED FOR MUSCLE SPASMS, Disp: 60 tablet, Rfl: 0   ferrous sulfate (SLOW IRON) 160 (50 Fe) MG TBCR SR tablet, Take 1 tablet (160 mg total) by mouth daily., Disp: 30 tablet, Rfl: 3   fluticasone (CUTIVATE) 0.05 % cream, Apply topically 2 (two) times daily., Disp: 15 g, Rfl: 0   Norgestimate-Ethinyl Estradiol Triphasic (TRI-LO-SPRINTEC) 0.18/0.215/0.25 MG-25 MCG tab, TAKE 1 TABLET BY MOUTH DAILY, Disp: 28 tablet, Rfl: 6   Norgestimate-Ethinyl Estradiol Triphasic (TRI-LO-SPRINTEC) 0.18/0.215/0.25 MG-25 MCG tab, Take 1 tablet by mouth daily., Disp: 28 tablet, Rfl: 3   omeprazole (PRILOSEC) 40 MG capsule, TAKE 1 CAPSULE(40 MG) BY MOUTH DAILY, Disp: 30 capsule, Rfl: 1   predniSONE (DELTASONE) 10 MG tablet, Take 1 tablet (10 mg total) by mouth daily with breakfast., Disp: 30 tablet, Rfl: 3   predniSONE (DELTASONE) 10 MG tablet, Take 10 mg by mouth daily with breakfast., Disp: , Rfl:       Objective:   Vitals:   08/15/22 0836  BP: 138/72  Pulse: (!) 114  Temp: 98.4 F (36.9 C)  TempSrc: Temporal  SpO2: 96%  Weight: 269 lb 3.2 oz (122.1 kg)  Height: _0  (1.626 m)    Estimated body mass index is 46.21 kg/m as calculated from the following:   Height as of this  encounter: _1  (1.626 m).   Weight as of this encounter: 269 lb 3.2 oz (122.1 kg).  _2 @  Filed Weights   08/15/22 0836  Weight: 269 lb 3.2 oz (122.1 kg)     Physical Exam  General: No distress. Looks well Neuro: Alert and Oriented x 3. GCS 15. Speech normal Psych: Pleasant Resp:  Barrel Chest - no.  Wheeze - no, Crackles - no, No overt respiratory distress CVS: Normal heart sounds. Murmurs - no Ext: Stigmata of Connective Tissue Disease - no HEENT: Normal upper airway. PEERL +. No post nasal drip        Assessment:       ICD-10-CM   1. Sarcoidosis  D86.9     2. Chronic cough  R05.3     3. Dyspnea on exertion  R06.09     4. Mediastinal adenopathy  R59.0          Plan:     Patient Instructions  Sarcoidosis Chronic cough Dyspnea on exertion Mediastinal adenopathy Current use of chronic steroids   - Features are consistent with  sarcoidosis is extremely active - Improved after prednisone but worse with prednisone taper to 43m per day - PFT worse but could be due to cough +/- sarcoid worening - safety labs - normal G6PDH May 2023 and QPercival SpanishMarch 2023  Plan  - check cbc, bmet, lft 05/14/2022 - check HgbA1c 05/14/2022 - CXR  05/14/2022 OR next visit -prednisone 481mdaily x 10 days -> then 3019maily x 10 days, then 1m93mily x 10 days and then 10mg30mly to continue - start methotrexate  oral 10mg 25m a week on Sundays or Mondays -refer [facility] Salivaethotrexate counseling and monitoring - at some point if needed we can take 2nd opinoon from Dr JAne EDaiva Hugearcoid expert - will need RSV, flu covid vaccines inf all 2023  Mitral valve regurgitation - moderate and Mild aortic dilatation - echo new findings may 2023 Hx of palpitations  Plan  -TEE per Dr Bettina Gavia - will email him about getting heart monitor for palpitations  Anemia, unspecified type -noted in October 2021. Persistent on March 2023  Seems like Iron Deficiency  anemia  Plan -recheck cbc 05/14/2022     Rheumatoid factor positive -mildly positive in 2021 and persistent though March 2023 Associted arthralgia  - can be sarcoid arthritis versus RA   Plan  - monitor -  - at some point can decide on rheumatology referral   Abnormal CT of liver along with MRI of the liver and spleen in 2021 ad in CT March 2023  -  understand last visit you want to stagger visits due to your work scheduled  Plan - Refer to gastroenterology some time in future  Pigmented skin lesions  -Very suspicious of sarcoidosis.  - Imrpoved with steroids bu worse again 05/14/2022 with prednisone taper  Plan - Refer to dermatology some poitn in future if needed  Follow-up - 8 weeks with Dr Chase Caller - 21 min visit    SIGNATURE    Dr. Brand Males, M.D., F.C.C.P,  Pulmonary and Critical Care Medicine Staff Physician, [place] Director - Interstitial Lung Disease  Program  P[facility] [place] [facility] [place], [place], [id number]  Pager: [phone], If no answer or between  15:00h - 7:00h: call [phone]  [id number] Telephone: [phone]  8:41 AM 08/15/2022

## 2022-08-27 ENCOUNTER — Other Ambulatory Visit: Payer: Self-pay | Admitting: Family Medicine

## 2022-09-04 ENCOUNTER — Other Ambulatory Visit: Payer: Self-pay | Admitting: Family Medicine

## 2022-09-04 ENCOUNTER — Encounter: Payer: Self-pay | Admitting: Family Medicine

## 2022-09-04 DIAGNOSIS — Z3009 Encounter for other general counseling and advice on contraception: Secondary | ICD-10-CM

## 2022-09-05 IMAGING — CT CT CHEST W/O CM
1 series · 15 of 34 positions shown, 19 images · non-contrast
Comparison: 09/29/2017 chest radiograph.  Neck CT 10/21/2019

CLINICAL DATA: Right upper lobe pulmonary nodule on neck CT.
Nonsmoker. No history cancer.

EXAM:
CT CHEST WITHOUT CONTRAST
TECHNIQUE: Multidetector CT imaging of the chest was performed following the
standard protocol without IV contrast.

[Series 2: chest w/(date) · axial · 0.72mm/px · z∈[-272,-42]mm · 15 of 136 slices shown, 19 images]
[im 11/136  mediastinal]
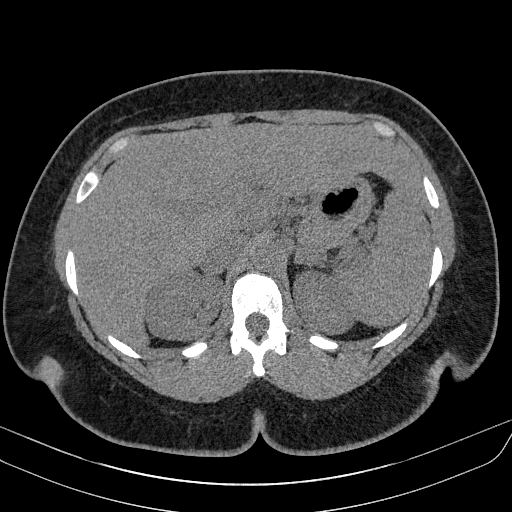
[im 11/136  lung]
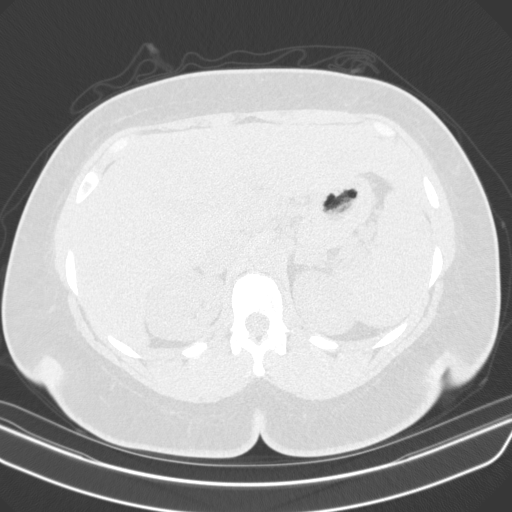
[im 21/136  lung]
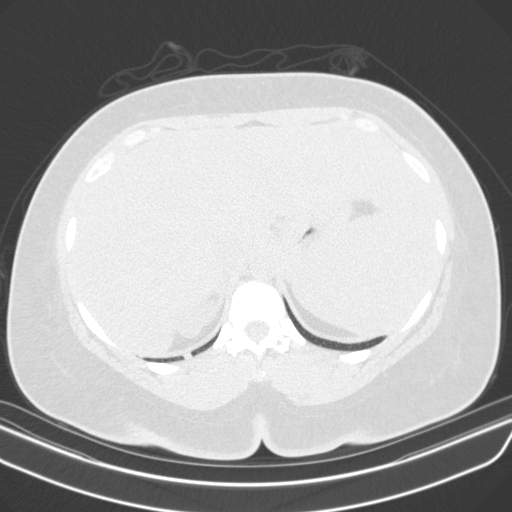
[im 28/136  lung]
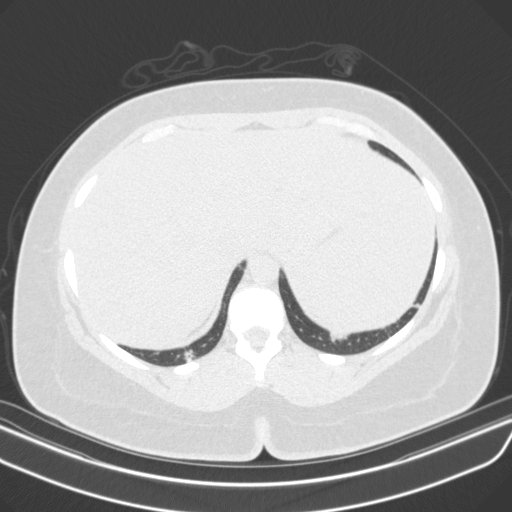
[im 36/136  lung]
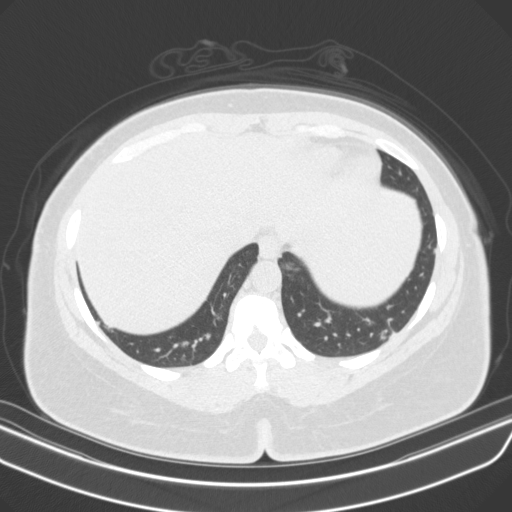
[im 46/136  mediastinal]
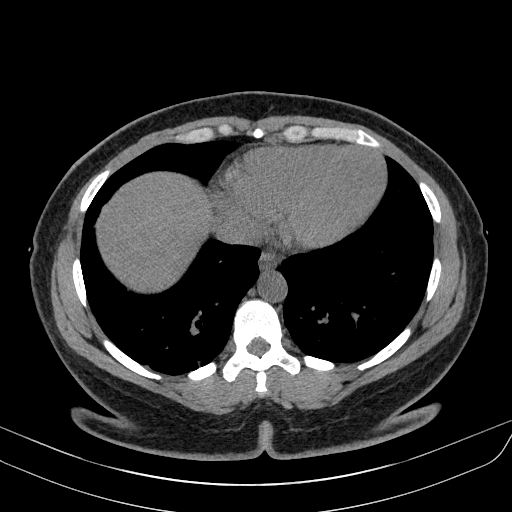
[im 46/136  lung]
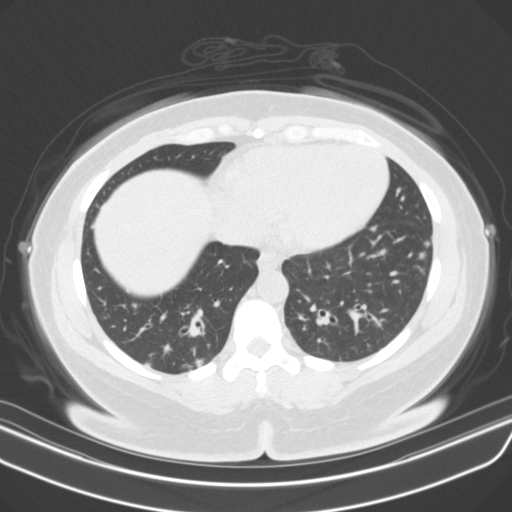
[im 55/136  lung]
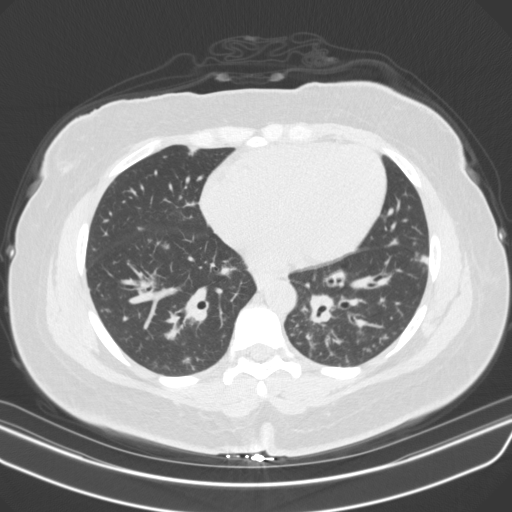
[im 61/136  lung]
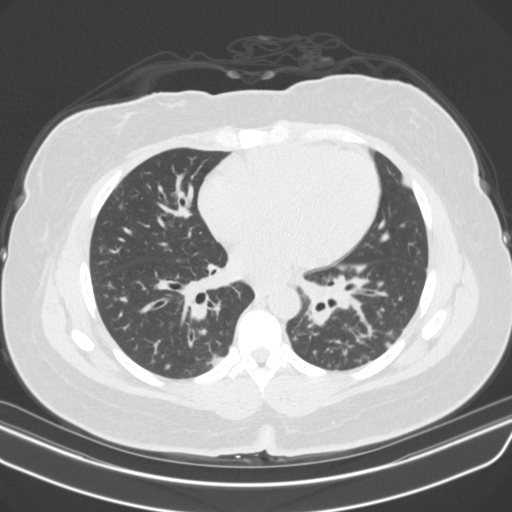
[im 71/136  lung]
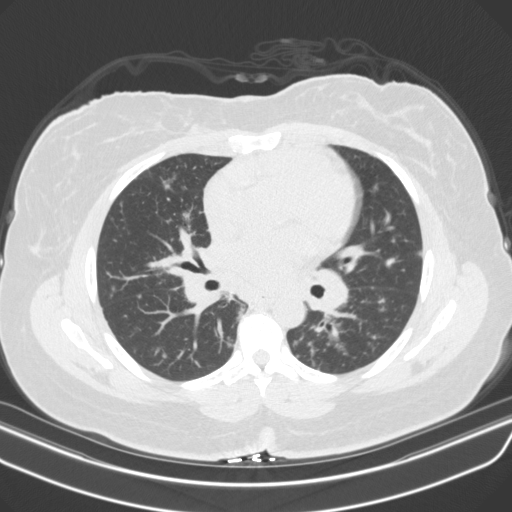
[im 76/136  mediastinal]
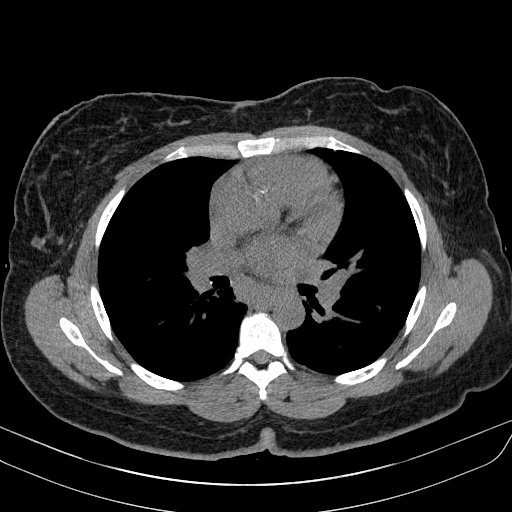
[im 76/136  lung]
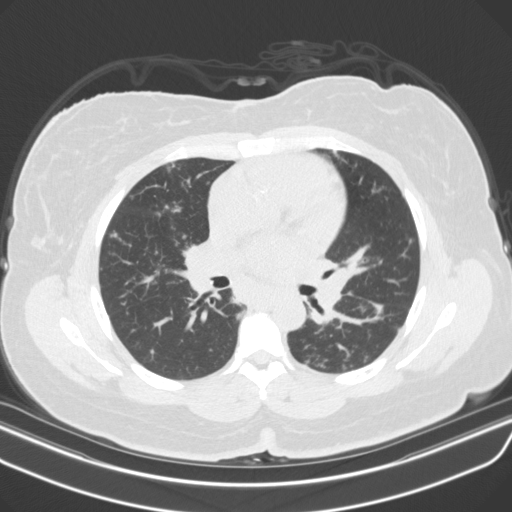
[im 82/136  lung]
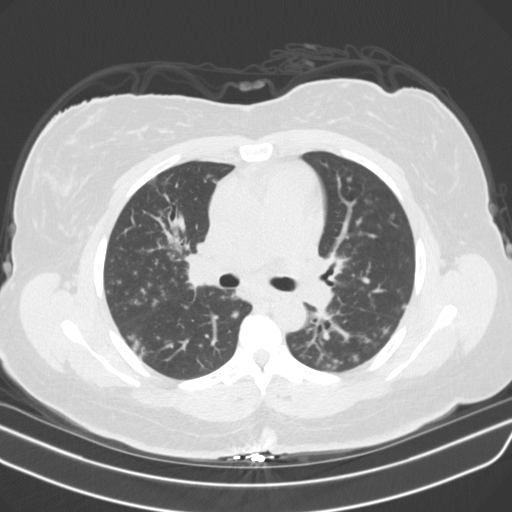
[im 91/136  lung]
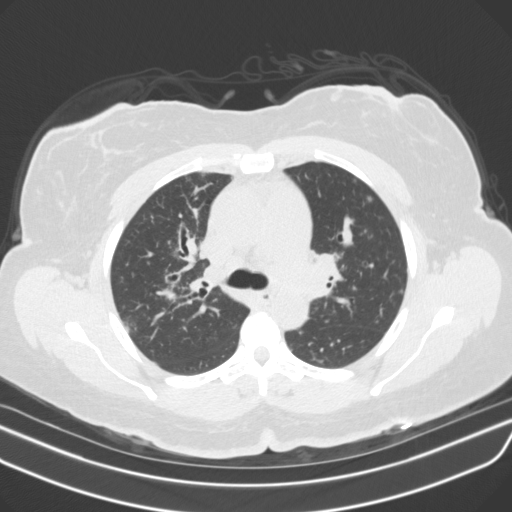
[im 101/136  lung]
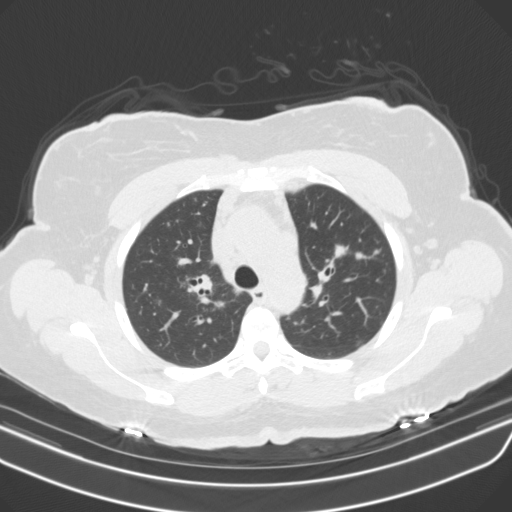
[im 109/136  mediastinal]
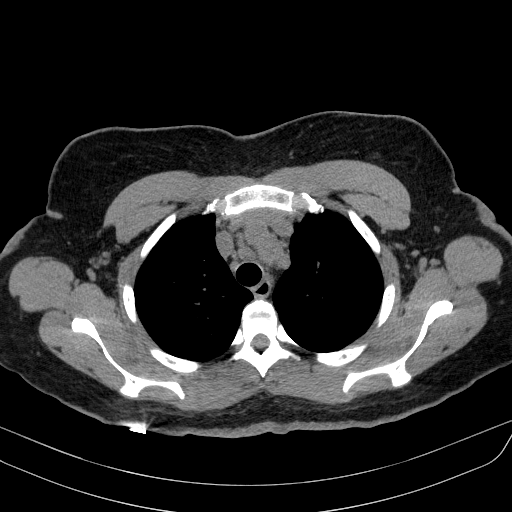
[im 109/136  lung]
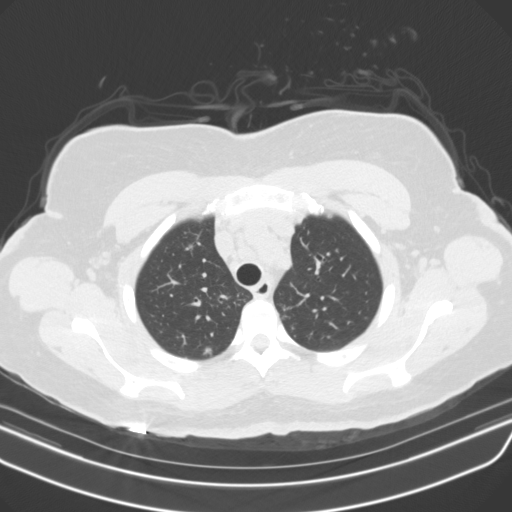
[im 116/136  lung]
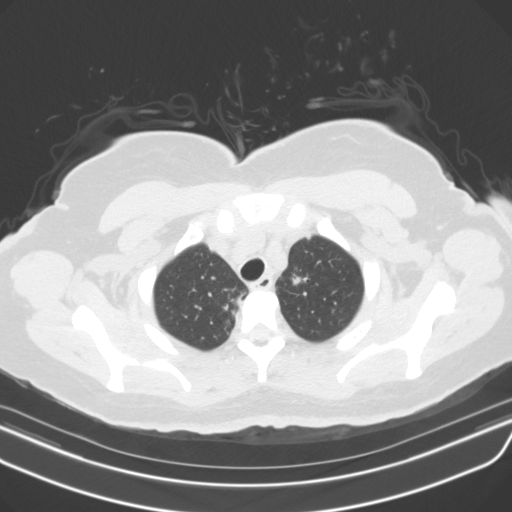
[im 126/136  lung]
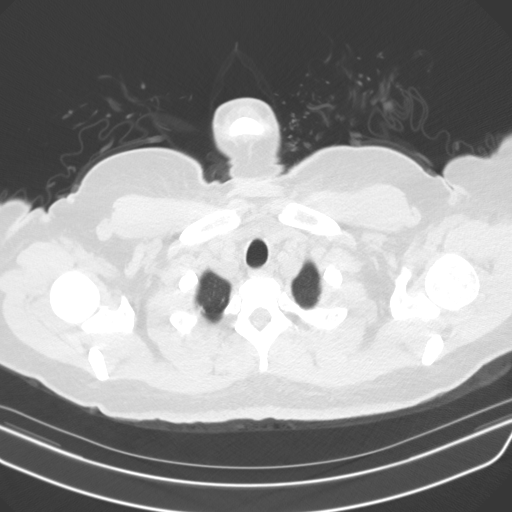

[15 of 34 positions shown; findings below may reference images not displayed]

FINDINGS: Cardiovascular: Normal aortic caliber. Aortic atherosclerosis.
Normal heart size, without pericardial effusion.

Mediastinum/Nodes: Right paratracheal node measures 1.2 cm on 41/2.

A subcarinal node measures 1.8 cm on 60/2.

Hilar regions poorly evaluated without contrast. Suspect mild
bilateral hilar adenopathy.

Multiple small prevascular nodes, including at up to 6 mm on 40/2.

Lungs/Pleura: No pleural fluid. Perilymphatic (peribronchovascular
and subpleural) distribution pulmonary nodularity throughout. The
index right apical nodule is again identified at 5 mm on [DATE]. Index
area of nodularity along the left major fissure at 9 mm on 64/3.

Upper Abdomen: Heterogeneous density throughout the liver. Normal
imaged portions of the spleen, adrenal glands, kidneys, stomach.

Musculoskeletal: No acute osseous abnormality.
IMPRESSION: 1. Pulmonary parenchymal and thoracic nodal findings which are
consistent with sarcoidosis.
2. Heterogeneous density throughout the liver. Possibly
heterogeneous steatosis. Hepatic involvement of sarcoidosis could
look similar. If there are right upper quadrant symptoms or abnormal
liver function test, pre and post contrast abdominal MRI should be
considered.

## 2022-09-23 NOTE — Progress Notes (Unsigned)
OV 06/19/2020  Subjective:  Patient ID: Ashley Henry, female , DOB: 07/25/1976 , age 47 y.o. , MRN: [id number] , ADDRESS: [address] [place] [address]  PCP Eulas Post, MD Attending : Treatment Team:  Attending Provider: Brand Males, MD    06/19/2020 -   Chief Complaint  Patient presents with   Consult    Patient is here for a productive/dry cough that she has had for several months with brown/yellow thivk sputum. Shortness of breath with exertion, stong smells, to hot.      HPI Ashley Henry 47 y.o. -referred by primary care physician Burchette, Alinda Sierras, MD.  Patient is a psychotherapist.  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  Her 3 maternal uncles, her brother and her maternal aunt all have sarcoidosis.  Her uncles are deceased although from other reasons.  Her brother and her aunt are still living.  She tells me that in the last 6 months he has had insidious onset of chronic cough, shortness of breath, night sweats and chills.  These have persisted.  This is despite having an intentional 8 pound weight loss during this time.  She says cough is made worse by talking or exertion.  Similarly shortness of breath is made worse by exertion.  Also made worse by talking through the mask during counseling sessions.  Both are relieved by rest.  Cough is also relieved by not talking.  She does have nocturnal awakening with cough chest tightness and wheezing.  She also reports right infra axillary right upper quadrant pain with coughing.  She does feel chest tightness deep inside the chest.  She had CT scan of the chest that I personally visualized.  Shows mediastinal lymphadenopathy consistent with stage I sarcoidosis.  CT scan abdomen cuts on the chest CT show possible evidence of sarcoidosis in the liver.  MRI has been recommended.  There are associated palpitations and night sweats.  Cough is associated with sputum productin     IMPRESSION:  1.  Pulmonary parenchymal and thoracic nodal findings which are consistent with sarcoidosis. 2. Heterogeneous density throughout the liver. Possibly heterogeneous steatosis. Hepatic involvement of sarcoidosis could look similar. If there are right upper quadrant symptoms or abnormal liver function test, pre and post contrast abdominal MRI should be considered.     Electronically Signed   By: Abigail Miyamoto M.D.   On: 05/29/2020 10:59  ROS - per HPI  Results for ASHLING, ROANE (MRN [id number]) as of 06/19/2020 09:03  Ref. Range 09/26/2017 14:27 10/13/2017 16:54 10/06/2018 14:47  Anti Nuclear Antibody (ANA) Latest Ref Range: NEGATIVE   NEGATIVE   SSA (Ro) (ENA) Antibody, IgG Latest Ref Range: <1.0 NEG AI <1.0 NEG    SSB (La) (ENA) Antibody, IgG Latest Ref Range: <1.0 NEG AI <1.0 NEG       07/13/2020 Follow up : Cough/? Sarcoid /Abnormal CT chest and MRI AB   47 year old female never smoker seen for pulmonary consult June 19, 2020 for 14-monthhistory of chronic cough shortness of breath night sweats and abnormal CT chest suspicious for sarcoidosis.  Patient has a very strong family history of sarcoidosis.  Patient presents for a 3-week follow-up.  Patient was seen last visit for pulmonary consult for a 635-monthistory of cough shortness of breath night sweats and chills.  She was seen by her primary care provider.  Subsequent CT chest May 29, 2020 showed pulmonary nodularity, possible bilateral bilateral hilar adenopathy,  liver abnormalities.  This was suspicious for possible sarcoidosis.  Lab work last visit showed an elevated ACE level at 82 and a high sed rate at greater than 130.  Her rheumatoid factor was positive but CCP was negative.  INR was slightly elevated at 1.1.  Patient anemia was stable with hemoglobin at 9.3.  Her platelets were normal.  Hepatic function testing was normal.  Patient was set up for an MRI abdomen that showed abnormalities with a hyperenhancing area in the medial  segment of the left hepatic lobe hepatic nodularity.  And splenic lesions.  There was suspicion for portal hypertension with possible varices versus a distal esophageal hyperenhancement.  There was no signs of steatosis. Patient has significant fatigue low energy decreased activity tolerance cough joint pains.  She has had intermittent rashes but has none currently.  She has had a recent eye exam with no reported abnormalities. We discussed all of her test and lab results. Has very strong family history of sarcoidosis with 3 maternal uncles, her brother and a maternal aunt that all have sarcoidosis. Patient has had intentional weight loss says that she has been on a diet and is down greater than 89 pounds. She denies any hemoptysis bloody stools hematemesis, bruising.  We had a long discussion regarding her abnormalities in her lab work and recent MRI of her liver and spleen.  We discussed the need to proceed with probable bronchoscopy with biopsy and referral to GI for consideration for further work-up of liver involvement and abnormalities. Unfortunately patient's medical insurance is going to stop July 16, 2020.  She declines flu shot and Covid vaccines  Patient works independently as a Management consultant.    TEST/EVENTS :  Chest x-ray September 29, 2017 clear lungs no acute process  Renal CT April 2017-no focal abnormalities noted in the liver, spleen or pancreas, nonobstructive left nephrolithiasis, moderate right hydroureter nephrosis  CT chest without contrast May 29, 2020 showed perilymphatic, peribronchovascular and subpleural distribution of pulmonary nodularity throughout right apical nodule 5 mm and nodularity along the left major fissure measuring 9 mm.  Heterogeneous density throughout the liver.,  Possible mild bilateral hilar adenopathy  MR abdomen July 08, 2020 hyperenhancing area in the medial segment of the left hepatic lobe, background of nodular hepatic contours and  hepatic heterogeneity atypical for sarcoid associated lesion given arterial enhancement other areas more typical for sarcoid but remain nonspecific.,  Splenic lesions noted.  Potential portal hypertension with varices versus distal esophageal hyperenhancement, no signs of steatosis  Labs June 19, 2020: QuantiFERON gold negative, Sjogren's negative, LFTs normal, rheumatoid factor positive, CCP negative, INR 1.1, allergy profile negative, IgE 67, Anti-DNA antibody negative, hepatic panel normal, platelets normal, WBC 7.1 ACE level elevated 82, sed rate greater than 130, vitamin D 8, hemoglobin 9.3 hematocrit 28  2D echo July 07, 2020 EF 55 to 60%, mild LVH, pulmonary artery pressure normal  Previous iron levels were noted low  HIV 2019 neg.   OV 11/22/2021  Subjective:  Patient ID: Ashley Henry, female , DOB: 1975/12/22 , age 74 y.o. , MRN: [id number] , ADDRESS: [address] [place] [address] PCP Eulas Post, MD Patient Care Team: Eulas Post, MD as PCP - General  This Provider for this visit: Treatment Team:  Attending Provider: Brand Males, MD    11/22/2021 -   Chief Complaint  Patient presents with   Follow-up    Pt states she is coughing a lot each day and also states  that her skin has been breaking out. States first thing in the morning she will cough up a lot of phlegm. States that she does have increased SOB especially when exercising.     ICD-10-CM   1. Sarcoidosis  D86.9     2. Chronic cough  R05.3     3. Dyspnea on exertion  R06.09     4. Mediastinal adenopathy  R59.0     5. Vitamin D deficiency  E55.9     6. Anemia, unspecified type  D64.9     7. Rheumatoid factor positive  R76.8     8. Abnormal CT of liver  R93.2     9. Pigmented skin lesions  L81.9        HPI LUMINA GITTO 47 y.o. -returns for follow-up.  Last seen personally in 2021.  At that time the concern was all his symptoms are pointing toward sarcoidosis.  She  then saw a nurse practitioner Patricia Nettle in October 2021.  She was confirmed to have sarcoidosis clinically based on high angiotensin-converting enzyme, African-American ethnicity, age and symptoms and CT scan findings of the lung.  She was then prescribed prednisone for 3 months she says during this time all the symptoms resolved.  She did not have insurance so did not do any further work-up.  She says then early last year she ran out of prednisone and since then having significant symptoms.  The symptoms include significant amount of night sweats, shortness of breath and wheezing.  Also severe cough day and night.  In the early morning the cough is productive with yellow sputum but rest of the day the cough is dry.  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  She showed a voice recording of the cough and there is clear laryngeal component to the cough.  Symptoms are rated as severe.  Cough not responding to Tessalon cough.  During this time she had skin lesions and arthralgia.  The skin lesions on the hands forehead.  There is also drying of the palms.  There is arthralgia diffusely.  Vitamin D deficiency: This was diagnosed in 2021.  Not on treatment currently  Anemia: This was diagnosed in 2021.  Not on treatment currently   Liver lesion seen on MRI along with splenic lesions: These were reported as atypical for sarcoid.  She never saw a GI  Skin lesions: This is new since last visit.  She is open to seeing dermatologist.      OV 12/18/2021  Subjective:  Patient ID: Ashley Henry, female , DOB: [birth year] , age 60 y.o. , MRN: [id number] , ADDRESS: [address] [place] [address] PCP Eulas Post, MD Patient Care Team: Eulas Post, MD as PCP - General    12/18/2021 -this video visit is to review test results done due to concern of sarcoidosis.   HPI JAYSA KISE 47 y.o. -since her last visit a few weeks ago she continues to have  the same symptoms of arthralgia and shortness of breath.  And skin lesions.  Referred her to the dermatologist and GI because of concerns of splenic lesions.  She has not yet seen them.  At this point in time she is focused on getting through symptom relief.  We did do investigations and they are as follows  CT scan of the chest: Shows worsening sarcoidosis.  I visualized the film and also showed it to the video  Pulmonary  function test: Not yet done  Blood work: Shows worsening angiotensin-converting enzyme level to greater than 180.  Persistently high ESR.Marland Kitchen  Rheumatoid factor continues to be trace positive.  Continues to be iron deficient anemic with a hemoglobin 9.9 g% which is baseline since 2014.  She says primary care physician has not yet addressed this.  IMPRESSION: 1. There has been dramatic increase in parenchymal lung involvement with extensive progressive perilymphatic nodularity, as detailed above, compatible with worsening sarcoidosis. 2. Multiple large low-attenuation splenic lesions, nonspecific, but likely reflective of sarcoid involvement in the spleen. 3. Dilatation of the pulmonic trunk (3.7 cm in diameter), concerning for pulmonary arterial hypertension. 4. Aortic atherosclerosis.   Aortic Atherosclerosis (ICD10-I70.0).     Electronically Signed   By: Vinnie Langton M.D.   On: 12/12/2021 07:41   01/28/2022 -   Chief Complaint  Patient presents with   Follow-up    PFT performed 01/25/22. Pt states she has been doing okay since last visit and denies any complaints.     HPI RUSHIE BRAZEL 47 y.o. -last office visit April 2023 on video.  Presents with her daughter who is at the bedside.  Diagnosis worsening sarcoidosis symptomatic given.  Commenced on prednisone.  I wrote for prednisone taper of 40 mg x 2 weeks and then 20 mg x 2 weeks.  The pharmacist informed that I skipped a dose of 30 mg for 2 weeks.  Therefore she took 40 mg for 2 weeks and then 30 mg for 2  weeks.  She is currently on 20 mg daily and she is finished 1 week of that.  She wants to extend the taper and go to 10 mg/day and then continue that per instructions.  The prednisone is really helped shortness of breath is improved cough is improved.  Her skin rash in the face and hands have improved.  Nevertheless she is having prednisone side effects of increased hunger 10 pound weight gain decreased sleep increased alertness.  Also facial puffiness.  Nevertheless she is continuing with the prednisone and she is okay doing that.  We did get an echocardiogram.  Previous echocardiogram 2021 was normal.  Currently is reporting moderate to severe mitral regurgitation and rater than 4 cm aortic root dilatation.  I did inform her of these findings.  I also passed a message on to Dr. Bettina Gavia her cardiologist.  Pulmonary function shows mild restriction with mild reduction in diffusion capacity.    OV 05/14/2022  Subjective:  Patient ID: Ashley Henry, female , DOB: Dec 10, 1975 , age 87 y.o. , MRN: [id number] , ADDRESS: [address] [place] [address] PCP Eulas Post, MD Patient Care Team: Eulas Post, MD as PCP - General  This Provider for this visit: Treatment Team:  Attending Provider: Brand Males, MD    05/14/2022 -   Chief Complaint  Patient presents with   Follow-up    PFT performed today.  Pt states she is about the same compared to last visit.   Follow-up pulmonary sarcoidosis last CT scan March 2023  HPI HENNESY SOBALVARRO 47 y.o. -returns for follow-up.  At the last visit her symptoms and the skin lesions were better after prednisone dosage.  We started tapering the prednisone.  Today she presents for follow-up.  She is now on 10 mg/day prednisone but she says with the taper cough is worse shortness of breath is worse wheezing is worse.  In addition skin lesions are also getting worse.  She is  still gaining weight though she states she is gained another 10  pounds of weight on the current 10 mg of prednisone.  She is frustrated by the worsening symptoms.  She did a pulmonary function test today.  This shows worsening of FVC and FEV1 however she did have significant amount of cough which is part of her symptoms from the sarcoid.  Therefore the results are somewhat unreliable.  Her daughter is here with her today.  She had normal G6PD testing are slightly high back in May 2023 March 2023 the QuantiFERON gold was negative. She has iron deficiency anemia when last checked in March 2023  She has mitral valve regurgitation and she saw Dr. Bettina Gavia and a TEE is pending very shortly.  She does admit to history of palpitations.  She does not recall having MRI of the heart or some kind of arrhythmia monitoring program.  I have written to Dr. Bettina Gavia about this.  We discussed the fact that she has progressive pulm sarcoidosis symptoms and the prednisone is causing side effects and also in addition with the prednisone taper she is having more symptoms.  Last visit we discussed the second line methotrexate therapy.  She is open to the idea.  We went over the side effects in detail including common and rare side effects and the requirement for significant amount of blood monitoring.  She is agreeable to this.  We discussed referral to [facility] or [facility] for clinical trial participation by [employer] but she is not interested.  We discussed about seeing Dr. Rodman Pickle in our practice resident sarcoid expert but at this point in time she wants to hold off.       OV 08/15/2022  Subjective:  Patient ID: Ashley Henry, female , DOB: June 11, 1976 , age 54 y.o. , MRN: [id number] , ADDRESS: [address] [place] [address] PCP Eulas Post, MD Patient Care Team: Eulas Post, MD as PCP - General  This Provider for this visit: Treatment Team:  Attending Provider: Brand Males, MD  Follow-up pulmonary sarcoidosis last CT scan March  2023  08/15/2022 -   Chief Complaint  Patient presents with   Follow-up    Doing good with prednisone currently     HPI BRITIANY SILBERNAGEL 48 y.o. -returns for follow-up.  At this point in time she is back on prednisone 10 mg/day.  She finds this dose to be helpful for her.  Early in the morning she has severe cough that last 30 to 60 minutes and it has white sputum.  Then she takes a prednisone 10 mg/day and the cough goes away.  Overall quality of life is better no shortness of breath rest of the day there is no cough.  Her skin lesions in the face have resolved.  The skin lesions in the hands do persist but they are improved.  She is open to getting a dermatology referral right now.  She did have mitral regurgitation on the 2D echo when she had a TEE and it is only mild.  This was in August 2023 and I reviewed that result.  At this point in time she is content taking prednisone 10 mg/day for her sarcoid.  She does not want methotrexate.  In the past she declined to clinical trials.  Review of the labs indicate that she had anemia in March 2023.  I will will offer her repeat CBC testing.  We discussed vaccination she has had a COVID-vaccine but not  her flu.  She has never had a flu shot and does not intend to.     OV 09/24/2022  Subjective:  Patient ID: Ashley Henry, female , DOB: 1976/04/30 , age 89 y.o. , MRN: [id number] , ADDRESS: [address] [place] [address] PCP Eulas Post, MD Patient Care Team: Eulas Post, MD as PCP - General  This Provider for this visit: Treatment Team:  Attending Provider: Brand Males, MD    09/24/2022 -   Chief Complaint  Patient presents with   Follow-up    Breathing has been good,winded with walking.      HPI MARKIYA KEEFE 47 y.o. -returns for follow-up with her daughter.  She is giving the history.  She tells me since last visit she has had 2 trips to [place] 1 time in November 2023 and the other 1 just this past  weekend.  She is having insidious onset of dyspnea on exertion relieved by rest.  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  Is been going on for 3 or 4 weeks relieved by rest.  She is also reporting bilateral ankle swelling around the same time.  Particularly with sitting.  She went [place] by car and it was definitely present.  Her baseline cough early in the morning for 30 minutes is unchanged.  This cough is made worse by perfume and smoke but now also present with the new onset of dyspnea.  She does have very streaky mild hemoptysis and thick yellow sputum but this is baseline.  Her hemoglobin was last checked a year ago and she was anemic.  I reviewed this result again.  She is wondering about getting a repeat CT scan of the chest to reassess her sarcoid.  She had a TEE in summer 2023 and she only had mild mitral valve prolapse.  I reviewed this result again.    CT Chest data  No results found.    PFT     Latest Ref Rng & Units 05/14/2022    3:33 PM 01/25/2022   12:10 PM  PFT Results  FVC-Pre L 1.95  2.30   FVC-Predicted Pre % 53  76   FVC-Post L  2.16   FVC-Predicted Post %  71   Pre FEV1/FVC % % 63  88   Post FEV1/FCV % %  85   FEV1-Pre L 1.22  2.02   FEV1-Predicted Pre % 41  82   FEV1-Post L  1.83   DLCO uncorrected ml/min/mmHg  15.20   DLCO UNC% %  70   DLCO corrected ml/min/mmHg  15.20   DLCO COR %Predicted %  70   DLVA Predicted %  122   TLC L  3.17   TLC % Predicted %  62   RV % Predicted %  54        has a past medical history of Abnormal CT scan of lung (07/13/2020), Abnormal finding on imaging of liver (07/13/2020), Anemia, BACK PAIN, UPPER (07/27/2010), BRONCHITIS, ACUTE (07/16/2010), CELLULITIS, GREAT TOE (01/08/2010), Enlarged uterus (05/13/2011), Essential hypertension (09/07/2009), HEADACHE (08/03/2010), HYPERLIPIDEMIA (10/11/2009), Hypertension (2010), INGROWN NAIL (02/08/2010), Iron deficiency anemia due  to chronic blood loss (06/20/2014), Kidney stone (10/30/2015), OBESITY (10/11/2009), PARESTHESIA (08/03/2010), Pre-diabetes, Renal disorder, SINUSITIS, ACUTE (12/07/2009), SORE THROAT (02/14/2010), and Vitamin D deficiency (07/13/2020).   reports that she has never smoked. She has never used smokeless tobacco.  Past Surgical History:  Procedure Laterality  Date   BREAST REDUCTION SURGERY  1999   BUBBLE STUDY  05/16/2022   Procedure: BUBBLE STUDY;  Surgeon: Fay Records, MD;  Location: [facility] ENDOSCOPY;  Service: Cardiovascular;;   CESAREAN SECTION  2004   Cysts removed from chest wall     EYE SURGERY Left    MASS EXCISION N/A 04/14/2018   Procedure: EXCISION OF CHEST WALL MASS;  Surgeon: Coralie Keens, MD;  Location: [place];  Service: General;  Laterality: N/A;   TEE WITHOUT CARDIOVERSION N/A 05/16/2022   Procedure: TRANSESOPHAGEAL ECHOCARDIOGRAM (TEE);  Surgeon: Fay Records, MD;  Location: [facility] ENDOSCOPY;  Service: Cardiovascular;  Laterality: N/A;    Allergies  Allergen Reactions   Multihance [Gadobenate] Nausea And Vomiting   Amlodipine Nausea Only   Collagenase Hives   Collagenase Clostridium Histolyticum Hives   Aspirin Rash    Immunization History  Administered Date(s) Administered   PFIZER(Purple Top)SARS-COV-2 Vaccination 12/30/2020, 01/20/2021, 06/30/2021   PPD Test 03/29/2020   Td 09/04/2009   Tdap 09/04/2009    Family History  Problem Relation Age of Onset   Hypertension Mother    Diabetes Mother    Hypertension Father    Stomach cancer Paternal Grandmother    Stomach cancer Maternal Uncle    Heart disease Maternal Uncle    Lung cancer Maternal Grandfather    Prostate cancer Maternal Uncle    Thyroid disease Maternal Uncle      Current Outpatient Medications:    albuterol (PROVENTIL) (2.5 MG/3ML) 0.083% nebulizer solution, Take 3 mLs (2.5 mg total) by nebulization every 6 (six) hours as needed for wheezing or shortness of breath., Disp: 75 mL,  Rfl: 5   albuterol (VENTOLIN HFA) 108 (90 Base) MCG/ACT inhaler, INHALE 2 PUFFS INTO THE LUNGS EVERY 6 HOURS AS NEEDED FOR WHEEZING OR SHORTNESS OF BREATH, Disp: 18 g, Rfl: 2   benzonatate (TESSALON) 100 MG capsule, TAKE 1 CAPSULE(100 MG) BY MOUTH THREE TIMES DAILY AS NEEDED FOR COUGH, Disp: 40 capsule, Rfl: 2   BEPREVE 1.5 % SOLN, Place 1 drop into both eyes daily., Disp: 10 mL, Rfl: 5   cyclobenzaprine (FLEXERIL) 5 MG tablet, TAKE 1 TO 2 TABLETS BY MOUTH EVERY NIGHT AS NEEDED FOR MUSCLE SPASMS, Disp: 60 tablet, Rfl: 0   ferrous sulfate (SLOW IRON) 160 (50 Fe) MG TBCR SR tablet, Take 1 tablet (160 mg total) by mouth daily., Disp: 30 tablet, Rfl: 3   fluticasone (CUTIVATE) 0.05 % cream, Apply topically 2 (two) times daily., Disp: 15 g, Rfl: 0   Norgestimate-Ethinyl Estradiol Triphasic (TRI-LO-SPRINTEC) 0.18/0.215/0.25 MG-25 MCG tab, Take 1 tablet by mouth daily., Disp: 28 tablet, Rfl: 3   Norgestimate-Ethinyl Estradiol Triphasic (TRI-LO-SPRINTEC) 0.18/0.215/0.25 MG-25 MCG tab, TAKE 1 TABLET BY MOUTH DAILY, Disp: 28 tablet, Rfl: 1   omeprazole (PRILOSEC) 40 MG capsule, TAKE 1 CAPSULE(40 MG) BY MOUTH DAILY, Disp: 30 capsule, Rfl: 1   predniSONE (DELTASONE) 10 MG tablet, Take 10 mg by mouth daily with breakfast., Disp: , Rfl:    predniSONE (DELTASONE) 10 MG tablet, Take 1 tablet (10 mg total) by mouth daily with breakfast., Disp: 30 tablet, Rfl: 3      Objective:   Vitals:   09/24/22 0915  BP: 132/86  Pulse: (!) 111  Temp: 98.4 F (36.9 C)  TempSrc: Oral  SpO2: 100%  Weight: 270 lb (122.5 kg)  Height: '5\' 4"'$  (1.626 m)    Estimated body mass index is 46.35 kg/m as calculated from the following:   Height as of  this encounter: '5\' 4"'$  (1.626 m).   Weight as of this encounter: 270 lb (122.5 kg).  '@WEIGHTCHANGE'$ @  [employer]   09/24/22 0915  Weight: 270 lb (122.5 kg)     Physical Exam    General: No distress. Looks well Neuro: Alert and Oriented x 3. GCS 15. Speech normal Psych:  Pleasant Resp:  Barrel Chest - no.  Wheeze - no, Crackles - no, No overt respiratory distress CVS: Normal heart sounds. Murmurs - no Ext: Stigmata of Connective Tissue Disease - no SKIN: some lupus pernio around face but more in hands HEENT: Normal upper airway. PEERL +. No post nasal drip        Assessment:       ICD-10-CM   1. Dyspnea on exertion  R06.09     2. Ankle edema  M25.473     3. Chronic cough  R05.3     4. Sarcoidosis  D86.9     5. Pigmented skin lesions  L81.9     6. High risk medication use  Z79.899          Plan:     Patient Instructions  Sarcoidosis Chronic cough Dyspnea on exertion Mediastinal adenopathy Current use of chronic steroids   - Shortness of breath worse. New onset Pedeal edema as well  Plan - ccheck cbc, iron panel bmet, bnp, d-dimer 09/24/2022  - will order ct chest based on results - continue prednisone '10mg'$  daily to continue -hold off methotrexate or referral to [facility] for clinical trials - respect deferral on flu vaccine   Anemia, unspecified type -noted in October 2021. Persistent on March 2023  Seems like Iron Deficiency anemia  Plan -recheck cbc 09/24/2022 and Iron panel   Pigmented skin lesions  -Very suspicious of sarcoidosis.  - Imrpoved with steroids bu worse again 05/14/2022 and improved (resolved in face but preesent I fingers though better) 08/15/2022 with prednisone 10 mg per day. New complaints of burning 09/24/2022   Plan - Await  dermatology appt spring 2024 - will see what type of  ointmetn you can use - might need few to several days to find out  Rheumatoid factor positive -mildly positive in 2021 and persistent though March 2023 Associted arthralgia  - can be sarcoid arthritis versus RA but glad joint pain improved   Plan  - monitor -  - at some point can decide on rheumatology referral   Abnormal CT of liver along with MRI of the liver and spleen in 2021 ad in CT March 2023  -  understand last  visit you want to stagger visits due to your work scheduled  Plan - Refer to gastroenterology some time in future    Follow-up - 8 weeks with Dr Chase Caller - 30 min visit  Moderate Complexity  The table below is from the 2021 E/M guidelines, first released in 2021, with minor revisions added in 2023. Must meet the requirements for 2 out of 3 dimensions to qualify.    Number and complexity of problems addressed Amount and/or complexity of data reviewed Risk of complications and/or morbidity  One or more chronic illness with mild exacerbation, progression, or side effects of treatment  Two or more stable chronic illnesses  One undiagnosed new problem with uncertain prognosis  One acute illness with systemic symptoms   Acute complicated injury Must meet the requirements for 1 of 3 of the categories)  Category 1: Tests and documents, historian  Any combination of 3 of the following:  Assessment requiring  an independent historian  Review of prior external records  Review of results of each unique test  Ordering of each unique test    Category 2: Interpretation of tests  Independent interpretation of a test perfromed by another physician/NPP  Category 3: Discuss management/tests  Discussion of magagement or tests with an external physician/NPP Prescription drug management  Decision regarding minor surgery with identfied patient or procedure risk factors  Decision regarding elective major surgery without identified patient or procedure risk factors  Diagnosis or treatment significantly limited by social determinants of health             SIGNATURE    Dr. Brand Males, M.D., F.C.C.P,  Pulmonary and Critical Care Medicine Staff Physician, [place] Director - Interstitial Lung Disease  Program  [facility] at [place], [place], [address]  Pager: [phone], If no answer or between   15:00h - 7:00h: call [phone] Telephone: [phone]  9:33 AM 09/24/2022

## 2022-09-23 NOTE — Patient Instructions (Signed)
Sarcoidosis Chronic cough Dyspnea on exertion Mediastinal adenopathy Current use of chronic steroids   - Shortness of breath worse. New onset Pedeal edema as well  Plan - ccheck cbc, iron panel bmet, bnp, d-dimer 09/24/2022  - will order ct chest based on results - continue prednisone 10mg  daily to continue - spiromery and dlco in 8-12 weeks -hold off methotrexate or referral to [facility] for clinical trials - respect deferral on flu vaccine   Anemia, unspecified type -noted in October 2021. Persistent on March 2023  Seems like Iron Deficiency anemia  Plan -recheck cbc 09/24/2022 and Iron panel   Pigmented skin lesions  -Very suspicious of sarcoidosis.  - Imrpoved with steroids bu worse again 05/14/2022 and improved (resolved in face but preesent I fingers though better) 08/15/2022 with prednisone 10 mg per day. New complaints of burning 09/24/2022   Plan - Await  dermatology appt spring 2024 - will see what type of  ointmetn you can use - might need few to several days to find out  Rheumatoid factor positive -mildly positive in 2021 and persistent though March 2023 Associted arthralgia  - can be sarcoid arthritis versus RA but glad joint pain improved   Plan  - monitor -  - at some point can decide on rheumatology referral   Abnormal CT of liver along with MRI of the liver and spleen in 2021 ad in CT March 2023  -  understand last visit you want to stagger visits due to your work scheduled  Plan - Refer to gastroenterology some time in future    Follow-up - 8 -12 weeks with Dr Marchelle Gearing - 30 min visit but after spiro/dlco

## 2022-09-24 ENCOUNTER — Encounter: Payer: Self-pay | Admitting: Internal Medicine

## 2022-09-24 ENCOUNTER — Ambulatory Visit (INDEPENDENT_AMBULATORY_CARE_PROVIDER_SITE_OTHER): Payer: Commercial Managed Care - HMO | Admitting: Internal Medicine

## 2022-09-24 VITALS — BP 132/86 | HR 111 | Temp 98.4°F | Ht 64.0 in | Wt 270.0 lb

## 2022-09-24 DIAGNOSIS — L819 Disorder of pigmentation, unspecified: Secondary | ICD-10-CM

## 2022-09-24 DIAGNOSIS — M25473 Effusion, unspecified ankle: Secondary | ICD-10-CM

## 2022-09-24 DIAGNOSIS — Z79899 Other long term (current) drug therapy: Secondary | ICD-10-CM

## 2022-09-24 DIAGNOSIS — R0609 Other forms of dyspnea: Secondary | ICD-10-CM

## 2022-09-24 DIAGNOSIS — Z5181 Encounter for therapeutic drug level monitoring: Secondary | ICD-10-CM

## 2022-09-24 DIAGNOSIS — R053 Chronic cough: Secondary | ICD-10-CM

## 2022-09-24 DIAGNOSIS — D869 Sarcoidosis, unspecified: Secondary | ICD-10-CM | POA: Diagnosis not present

## 2022-09-24 LAB — COMPREHENSIVE METABOLIC PANEL
ALT: 17 U/L (ref 0–35)
AST: 16 U/L (ref 0–37)
Albumin: 3.6 g/dL (ref 3.5–5.2)
Alkaline Phosphatase: 133 U/L — ABNORMAL HIGH (ref 39–117)
BUN: 14 mg/dL (ref 6–23)
CO2: 26 mEq/L (ref 19–32)
Calcium: 9.1 mg/dL (ref 8.4–10.5)
Chloride: 101 mEq/L (ref 96–112)
Creatinine, Ser: 1.23 mg/dL — ABNORMAL HIGH (ref 0.40–1.20)
GFR: 53.34 mL/min — ABNORMAL LOW (ref >60.00)
Glucose, Bld: 113 mg/dL — ABNORMAL HIGH (ref 70–99)
Potassium: 4.3 mEq/L (ref 3.5–5.1)
Sodium: 139 mEq/L (ref 135–145)
Total Bilirubin: 0.3 mg/dL (ref 0.2–1.2)
Total Protein: 7.4 g/dL (ref 6.0–8.3)

## 2022-09-24 LAB — CBC WITH DIFFERENTIAL/PLATELET
Basophils Absolute: 0.1 10*3/uL (ref 0.0–0.1)
Basophils Relative: 1.1 % (ref 0.0–3.0)
Eosinophils Absolute: 0.3 10*3/uL (ref 0.0–0.7)
Eosinophils Relative: 2.7 % (ref 0.0–5.0)
HCT: 30.3 % — ABNORMAL LOW (ref 36.0–46.0)
Hemoglobin: 9.7 g/dL — ABNORMAL LOW (ref 12.0–15.0)
Lymphocytes Relative: 19 % (ref 12.0–46.0)
Lymphs Abs: 1.8 10*3/uL (ref 0.7–4.0)
MCHC: 32 g/dL (ref 30.0–36.0)
MCV: 63.1 fl — ABNORMAL LOW (ref 78.0–100.0)
Monocytes Absolute: 0.5 10*3/uL (ref 0.1–1.0)
Monocytes Relative: 5.5 % (ref 3.0–12.0)
Neutro Abs: 6.9 10*3/uL (ref 1.4–7.7)
Neutrophils Relative %: 71.7 % (ref 43.0–77.0)
Platelets: 443 10*3/uL — ABNORMAL HIGH (ref 150.0–400.0)
RBC: 4.8 Mil/uL (ref 3.87–5.11)
RDW: 20.1 % — ABNORMAL HIGH (ref 11.5–15.5)
WBC: 9.7 10*3/uL (ref 4.0–10.5)

## 2022-09-24 LAB — BASIC METABOLIC PANEL
BUN: 14 mg/dL (ref 6–23)
CO2: 26 mEq/L (ref 19–32)
Calcium: 9.1 mg/dL (ref 8.4–10.5)
Chloride: 101 mEq/L (ref 96–112)
Creatinine, Ser: 1.23 mg/dL — ABNORMAL HIGH (ref 0.40–1.20)
GFR: 53.34 mL/min — ABNORMAL LOW (ref >60.00)
Glucose, Bld: 113 mg/dL — ABNORMAL HIGH (ref 70–99)
Potassium: 4.3 mEq/L (ref 3.5–5.1)
Sodium: 139 mEq/L (ref 135–145)

## 2022-09-24 LAB — D-DIMER, QUANTITATIVE: D-Dimer, Quant: 1.18 mcg/mL FEU — ABNORMAL HIGH (ref <0.50)

## 2022-09-24 LAB — IBC + FERRITIN
Ferritin: 6.9 ng/mL — ABNORMAL LOW (ref 10.0–291.0)
Iron: 30 ug/dL — ABNORMAL LOW (ref 42–145)
Saturation Ratios: 6.1 % — ABNORMAL LOW (ref 20.0–50.0)
TIBC: 494.2 ug/dL — ABNORMAL HIGH (ref 250.0–450.0)
Transferrin: 353 mg/dL (ref 212.0–360.0)

## 2022-09-24 LAB — HEMOGLOBIN A1C: Hgb A1c MFr Bld: 7.2 % — ABNORMAL HIGH (ref 4.6–6.5)

## 2022-09-24 LAB — PHOSPHORUS: Phosphorus: 3.7 mg/dL (ref 2.3–4.6)

## 2022-09-24 LAB — FERRITIN: Ferritin: 6.9 ng/mL — ABNORMAL LOW (ref 10.0–291.0)

## 2022-09-24 LAB — BRAIN NATRIURETIC PEPTIDE: Pro B Natriuretic peptide (BNP): 86 pg/mL (ref 0.0–100.0)

## 2022-09-25 LAB — HIV ANTIBODY (ROUTINE TESTING W REFLEX): HIV 1&2 Ab, 4th Generation: NONREACTIVE

## 2022-09-25 LAB — HEPATITIS C ANTIBODY: Hepatitis C Ab: NONREACTIVE

## 2022-09-25 LAB — HEPATITIS B SURFACE ANTIGEN: Hepatitis B Surface Ag: NONREACTIVE

## 2022-09-25 LAB — HEPATITIS B CORE ANTIBODY, IGM: Hep B C IgM: NONREACTIVE

## 2022-09-27 ENCOUNTER — Encounter: Payer: Self-pay | Admitting: Internal Medicine

## 2022-09-27 DIAGNOSIS — M25473 Effusion, unspecified ankle: Secondary | ICD-10-CM

## 2022-09-27 DIAGNOSIS — R7989 Other specified abnormal findings of blood chemistry: Secondary | ICD-10-CM

## 2022-09-27 DIAGNOSIS — R0609 Other forms of dyspnea: Secondary | ICD-10-CM

## 2022-09-29 NOTE — Telephone Encounter (Signed)
LABS -- I spoke to patient 7:01 PM 09/29/2022   Chronic anemia - unchanged level. It is iron deficiency Talk to PCP Eulas Post, MD  2. HbA1C 7.2 -> prednisoe pushing you into diabetes 0> talk to PCP Eulas Post, MD Creat 1.'23mg'$ % (GFR 53 ) and slightly up and entering abnormal range - was normal a year ago in march but she had a similar number 1.'3mg'$ % in 2017 -  D-dimer: 1.18 -> spoke to patient :DOE is same and no neew issues-> GET  VQ SCAN + Doppler URGENT CTA is a problem due to creat But if getting any problems advise her to go to [facility] lupus Colebatasol 0.05% BID x 2 weeks to non-face affected areas - send in script - this was recommendec by Dr Amy Martinique Make referral to Dr Amy Martinique in [place] [facility] - say wait time to [place] is long  Thanks    SIGNATURE    Dr. Brand Males, M.D., F.C.C.P,  Pulmonary and Critical Care Medicine Staff Physician, [place] Director - Interstitial Lung Disease  Program  Medical Director - [facility] [place] at [place], [place], [address]   Pager: [phone], If no answer  -[place] or [place] [phone] Telephone (clinical office): [phone] Telephone (research): [phone]  7:01 PM 09/29/2022     Latest Reference Range & Units 09/24/22 09:56 09/24/22 96:28  BASIC METABOLIC PANEL   Rpt !  COMPREHENSIVE METABOLIC PANEL   Rpt !  Sodium 135 - 145 mEq/L 135 - 145 mEq/L  139 139  Potassium 3.5 - 5.1 mEq/L 3.5 - 5.1 mEq/L  4.3 4.3  Chloride 96 - 112 mEq/L 96 - 112 mEq/L  101 101  CO2 19 - 32 mEq/L 19 - 32 mEq/L  26 26  Glucose 70 - 99 mg/dL 70 - 99 mg/dL  113 (H) 113 (H)  BUN 6 - 23 mg/dL 6 - 23 mg/dL  14 14  Creatinine 0.40 - 1.20 mg/dL 0.40 - 1.20 mg/dL  1.23 (H) 1.23 (H)  Calcium 8.4 - 10.5 mg/dL 8.4 - 10.5 mg/dL  9.1 9.1  Phosphorus 2.3 - 4.6 mg/dL  3.7  Alkaline Phosphatase 39 - 117 U/L  133 (H)   Albumin 3.5 - 5.2 g/dL  3.6  AST 0 - 37 U/L  16  ALT 0 - 35 U/L  17  Total Protein 6.0 - 8.3 g/dL  7.4  Total Bilirubin 0.2 - 1.2 mg/dL  0.3  GFR >60.00 mL/min >60.00 mL/min  53.34 (L) 53.34 (L)  Pro B Natriuretic peptide (BNP) 0.0 - 100.0 pg/mL  86.0  Iron 42 - 145 ug/dL  30 (L)  TIBC 250.0 - 450.0 mcg/dL  494.2 (H)  Saturation Ratios 20.0 - 50.0 %  6.1 (L)  Ferritin 10.0 - 291.0 ng/mL 10.0 - 291.0 ng/mL  6.9 (L) 6.9 (L)  Transferrin 212.0 - 360.0 mg/dL  353.0  WBC 4.0 - 10.5 K/uL  9.7  RBC 3.87 - 5.11 Mil/uL  4.80  Hemoglobin 12.0 - 15.0 g/dL  9.7 (L)  HCT 36.0 - 46.0 %  30.3 (L)  MCV 78.0 - 100.0 fl  63.1 Repeated and verified X2. (L)  MCHC 30.0 - 36.0 g/dL  32.0  RDW 11.5 - 15.5 %  20.1 (H)  Platelets 150.0 - 400.0 K/uL  443.0 (H)  Neutrophils 43.0 - 77.0 %  71.7  Lymphocytes 12.0 -  46.0 %  19.0  Monocytes Relative 3.0 - 12.0 %  5.5  Eosinophil 0.0 - 5.0 %  2.7  Basophil 0.0 - 3.0 %  1.1  NEUT# 1.4 - 7.7 K/uL  6.9  Lymphocyte # 0.7 - 4.0 K/uL  1.8  Monocyte # 0.1 - 1.0 K/uL  0.5  Eosinophils Absolute 0.0 - 0.7 K/uL  0.3  Basophils Absolute 0.0 - 0.1 K/uL  0.1  D-Dimer, Quant <0.50 mcg/mL FEU 1.18 (H)   Hemoglobin A1C 4.6 - 6.5 %  7.2 (H)  Hepatitis B Surface Ag NON-REACTIVE   NON-REACTIVE  Hep B Core Ab, IgM NON-REACTIVE   NON-REACTIVE  Hepatitis C Ab NON-REACTIVE   NON-REACTIVE  HIV NON-REACTIVE   NON-REACTIVE  !: Data is abnormal (H): Data is abnormally high (L): Data is abnormally low Rpt: View report in Results Review for more information

## 2022-09-30 ENCOUNTER — Encounter: Payer: Self-pay | Admitting: Internal Medicine

## 2022-09-30 ENCOUNTER — Other Ambulatory Visit (HOSPITAL_COMMUNITY): Payer: Commercial Managed Care - HMO

## 2022-09-30 ENCOUNTER — Other Ambulatory Visit: Payer: Self-pay | Admitting: Family Medicine

## 2022-09-30 NOTE — Telephone Encounter (Signed)
Orders put in for VQ scan and doppler study.

## 2022-10-01 ENCOUNTER — Ambulatory Visit (HOSPITAL_BASED_OUTPATIENT_CLINIC_OR_DEPARTMENT_OTHER)
Admission: RE | Admit: 2022-10-01 | Discharge: 2022-10-01 | Disposition: A | Discharge status: Home / Self Care | Payer: Commercial Managed Care - HMO | Source: Ambulatory Visit | Attending: Internal Medicine | Admitting: Internal Medicine

## 2022-10-01 ENCOUNTER — Ambulatory Visit (HOSPITAL_COMMUNITY)
Admission: RE | Admit: 2022-10-01 | Discharge: 2022-10-01 | Disposition: A | Discharge status: Home / Self Care | Payer: Commercial Managed Care - HMO | Source: Ambulatory Visit | Attending: Internal Medicine | Admitting: Internal Medicine

## 2022-10-01 ENCOUNTER — Telehealth: Payer: Self-pay | Admitting: Internal Medicine

## 2022-10-01 DIAGNOSIS — R0609 Other forms of dyspnea: Secondary | ICD-10-CM

## 2022-10-01 DIAGNOSIS — D869 Sarcoidosis, unspecified: Secondary | ICD-10-CM | POA: Diagnosis not present

## 2022-10-01 DIAGNOSIS — R7989 Other specified abnormal findings of blood chemistry: Secondary | ICD-10-CM

## 2022-10-01 DIAGNOSIS — M25473 Effusion, unspecified ankle: Secondary | ICD-10-CM

## 2022-10-01 MED ORDER — TECHNETIUM TO 99M ALBUMIN AGGREGATED
4.1700 | Freq: Once | INTRAVENOUS | Status: AC | PRN
  Administered 2022-10-01: 4.17 via INTRAVENOUS

## 2022-10-01 NOTE — Progress Notes (Signed)
Bilateral lower extremity venous duplex has been completed. Preliminary results can be found in CV Proc through chart review.  Results were given to [facility] at Dr. Golden Pop office.  10/01/22 8:11 AM Carlos Levering RVT

## 2022-10-01 NOTE — Telephone Encounter (Signed)
LEt patient know that no PE or DVT In testing . Good news    NM Pulmonary Perfusion  Result Date: 10/01/2022 CLINICAL DATA:  Dyspnea on exertion, elevated D-dimer EXAM: NUCLEAR MEDICINE PERFUSION LUNG SCAN TECHNIQUE: Perfusion images were obtained in multiple projections after intravenous injection of radiopharmaceutical. Ventilation scans intentionally deferred if perfusion scan and chest x-ray adequate for interpretation during COVID 19 epidemic. RADIOPHARMACEUTICALS:  4.17 mCi Tc-74mMAA IV COMPARISON:  Chest radiograph 10/01/2022 FINDINGS: Single tiny subsegmental perfusion defect lateral RIGHT upper lobe, corresponding to site of infiltrate on chest radiograph. No additional perfusion defects. IMPRESSION: Pulmonary embolism absent. Electronically Signed   By: MLavonia DanaM.D.   On: 10/01/2022 09:18   VAS [place]OWER EXTREMITY VENOUS (DVT)  Result Date: 10/01/2022  Lower Venous DVT Study Patient Name:  Ashley Henry Date of Exam:   10/01/2022 Medical Rec #: [id number]      Accession #:    [id number] of Birth: [birth year]      Patient Gender: F Patient Age:   89years [facility]      V[facility] [place]OWER EXTREMITY VENOUS (DVT) Referring Phys: Estee Yohe --------------------------------------------------------------------------------  Indications: Edema.  Risk Factors: None identified. Limitations: Poor ultrasound/tissue interface and body habitus. Comparison Study: No prior studies. Performing Technologist: GOliver HumRVT  Examination Guidelines: A complete evaluation includes B-mode imaging, spectral Doppler, color Doppler, and power Doppler as needed of all accessible portions of each vessel. Bilateral testing is considered an integral part of a complete examination. Limited examinations for reoccurring indications may be performed as noted. The reflux portion of the exam is performed with the patient in reverse Trendelenburg.   +---------+---------------+---------+-----------+----------+--------------+ RIGHT    CompressibilityPhasicitySpontaneityPropertiesThrombus Aging +---------+---------------+---------+-----------+----------+--------------+ CFV      Full           Yes      Yes                                 +---------+---------------+---------+-----------+----------+--------------+ SFJ      Full                                                        +---------+---------------+---------+-----------+----------+--------------+ FV Prox  Full                                                        +---------+---------------+---------+-----------+----------+--------------+ FV Mid   Full                                                        +---------+---------------+---------+-----------+----------+--------------+ FV DistalFull                                                        +---------+---------------+---------+-----------+----------+--------------+  PFV      Full                                                        +---------+---------------+---------+-----------+----------+--------------+ POP      Full           Yes      Yes                                 +---------+---------------+---------+-----------+----------+--------------+ PTV      Full                                                        +---------+---------------+---------+-----------+----------+--------------+ PERO     Full                                                        +---------+---------------+---------+-----------+----------+--------------+   +---------+---------------+---------+-----------+----------+--------------+ LEFT     CompressibilityPhasicitySpontaneityPropertiesThrombus Aging +---------+---------------+---------+-----------+----------+--------------+ CFV      Full           Yes      Yes                                  +---------+---------------+---------+-----------+----------+--------------+ SFJ      Full                                                        +---------+---------------+---------+-----------+----------+--------------+ FV Prox  Full                                                        +---------+---------------+---------+-----------+----------+--------------+ FV Mid   Full                                                        +---------+---------------+---------+-----------+----------+--------------+ FV Distal               Yes      Yes                                 +---------+---------------+---------+-----------+----------+--------------+ PFV      Full                                                        +---------+---------------+---------+-----------+----------+--------------+  POP      Full           Yes      Yes                                 +---------+---------------+---------+-----------+----------+--------------+ PTV      Full                                                        +---------+---------------+---------+-----------+----------+--------------+ PERO     Full                                                        +---------+---------------+---------+-----------+----------+--------------+    Summary: RIGHT: - There is no evidence of deep vein thrombosis in the lower extremity.  - No cystic structure found in the popliteal fossa.  LEFT: - There is no evidence of deep vein thrombosis in the lower extremity. However, portions of this examination were limited- see technologist comments above.  - No cystic structure found in the popliteal fossa.  *See table(s) above for measurements and observations.    Preliminary    DG Chest 2 View  Result Date: 10/01/2022 CLINICAL DATA:  Follow-up sarcoidosis EXAM: CHEST - 2 VIEW COMPARISON:  Chest x-ray September 29, 2017.  Chest CT December 11, 2021 FINDINGS: Bilateral pulmonary opacities are  increased compared to September 29, 2017 but similar compared to December 11, 2021 consistent with known sarcoidosis. The cardiomediastinal silhouette is stable. No pneumothorax. No other interval changes. IMPRESSION: Bilateral pulmonary opacities are increased compared to September 29, 2017 but similar compared to December 11, 2021 consistent with known sarcoidosis. Electronically Signed   By: Dorise Bullion III M.D.   On: 10/01/2022 08:27

## 2022-10-01 NOTE — Telephone Encounter (Signed)
Spoke with and notified of results Nothing further needed

## 2022-10-01 NOTE — Telephone Encounter (Signed)
Patient is returning a call.  Please call patient back at this number [phone]

## 2022-10-01 NOTE — Telephone Encounter (Signed)
Called the pt and there was no answer- LMTCB    

## 2022-10-02 ENCOUNTER — Ambulatory Visit: Payer: Commercial Managed Care - HMO | Admitting: Cardiology

## 2022-10-14 ENCOUNTER — Encounter (HOSPITAL_COMMUNITY): Payer: Commercial Managed Care - HMO

## 2022-12-06 ENCOUNTER — Ambulatory Visit (INDEPENDENT_AMBULATORY_CARE_PROVIDER_SITE_OTHER): Payer: Commercial Managed Care - HMO | Admitting: Internal Medicine

## 2022-12-06 ENCOUNTER — Other Ambulatory Visit: Payer: Self-pay | Admitting: Family Medicine

## 2022-12-06 ENCOUNTER — Encounter: Payer: Self-pay | Admitting: Internal Medicine

## 2022-12-06 VITALS — BP 172/74 | HR 124 | Ht 64.0 in | Wt 274.0 lb

## 2022-12-06 DIAGNOSIS — Z79899 Other long term (current) drug therapy: Secondary | ICD-10-CM | POA: Diagnosis not present

## 2022-12-06 DIAGNOSIS — D869 Sarcoidosis, unspecified: Secondary | ICD-10-CM | POA: Diagnosis not present

## 2022-12-06 DIAGNOSIS — R0609 Other forms of dyspnea: Secondary | ICD-10-CM | POA: Diagnosis not present

## 2022-12-06 DIAGNOSIS — I10 Essential (primary) hypertension: Secondary | ICD-10-CM

## 2022-12-06 DIAGNOSIS — E119 Type 2 diabetes mellitus without complications: Secondary | ICD-10-CM

## 2022-12-06 DIAGNOSIS — L819 Disorder of pigmentation, unspecified: Secondary | ICD-10-CM

## 2022-12-06 DIAGNOSIS — Z5181 Encounter for therapeutic drug level monitoring: Secondary | ICD-10-CM

## 2022-12-06 LAB — HEMOGLOBIN A1C: Hgb A1c MFr Bld: 7.2 % — ABNORMAL HIGH (ref 4.6–6.5)

## 2022-12-06 MED ORDER — PREDNISONE 5 MG PO TABS: 7.5000 mg | ORAL_TABLET | Freq: Every day | ORAL | 1 refills | Status: DC

## 2022-12-06 NOTE — Progress Notes (Unsigned)
OV 06/19/2020  Subjective:  Patient ID: Ashley Henry, female , DOB: September 27, 1975 , age 47 y.o. , MRN: [id number] , ADDRESS: [address] [place] [address]  PCP Eulas Post, MD Attending : Treatment Team:  Attending Provider: Brand Males, MD    06/19/2020 -   Chief Complaint  Patient presents with   Consult    Patient is here for a productive/dry cough that she has had for several months with brown/yellow thivk sputum. Shortness of breath with exertion, stong smells, to hot.      HPI Ashley Henry 47 y.o. -referred by primary care physician Burchette, Alinda Sierras, MD.  Patient is a psychotherapist.  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  Her 3 maternal uncles, her brother and her maternal aunt all have sarcoidosis.  Her uncles are deceased although from other reasons.  Her brother and her aunt are still living.  She tells me that in the last 6 months he has had insidious onset of chronic cough, shortness of breath, night sweats and chills.  These have persisted.  This is despite having an intentional 8 pound weight loss during this time.  She says cough is made worse by talking or exertion.  Similarly shortness of breath is made worse by exertion.  Also made worse by talking through the mask during counseling sessions.  Both are relieved by rest.  Cough is also relieved by not talking.  She does have nocturnal awakening with cough chest tightness and wheezing.  She also reports right infra axillary right upper quadrant pain with coughing.  She does feel chest tightness deep inside the chest.  She had CT scan of the chest that I personally visualized.  Shows mediastinal lymphadenopathy consistent with stage I sarcoidosis.  CT scan abdomen cuts on the chest CT show possible evidence of sarcoidosis in the liver.  MRI has been recommended.  There are associated palpitations and night sweats.  Cough is associated with sputum productin     IMPRESSION:  1.  Pulmonary parenchymal and thoracic nodal findings which are consistent with sarcoidosis. 2. Heterogeneous density throughout the liver. Possibly heterogeneous steatosis. Hepatic involvement of sarcoidosis could look similar. If there are right upper quadrant symptoms or abnormal liver function test, pre and post contrast abdominal MRI should be considered.     Electronically Signed   By: Abigail Miyamoto M.D.   On: 05/29/2020 10:59  ROS - per HPI  Results for [name], [name] (MRN [id number]) as of 06/19/2020 09:03  Ref. Range 09/26/2017 14:27 10/13/2017 16:54 10/06/2018 14:47  Anti Nuclear Antibody (ANA) Latest Ref Range: NEGATIVE   NEGATIVE   SSA (Ro) (ENA) Antibody, IgG Latest Ref Range: <1.0 NEG AI <1.0 NEG    SSB (La) (ENA) Antibody, IgG Latest Ref Range: <1.0 NEG AI <1.0 NEG       07/13/2020 Follow up : Cough/? Sarcoid /Abnormal CT chest and MRI AB   47 year old female never smoker seen for pulmonary consult June 19, 2020 for 60-monthhistory of chronic cough shortness of breath night sweats and abnormal CT chest suspicious for sarcoidosis.  Patient has a very strong family history of sarcoidosis.  Patient presents for a 3-week follow-up.  Patient was seen last visit for pulmonary consult for a 649-monthistory of cough shortness of breath night sweats and chills.  She was seen by her primary care provider.  Subsequent CT chest May 29, 2020 showed pulmonary nodularity, possible bilateral bilateral hilar  adenopathy, liver abnormalities.  This was suspicious for possible sarcoidosis.  Lab work last visit showed an elevated ACE level at 82 and a high sed rate at greater than 130.  Her rheumatoid factor was positive but CCP was negative.  INR was slightly elevated at 1.1.  Patient anemia was stable with hemoglobin at 9.3.  Her platelets were normal.  Hepatic function testing was normal.  Patient was set up for an MRI abdomen that showed abnormalities with a hyperenhancing area in the medial  segment of the left hepatic lobe hepatic nodularity.  And splenic lesions.  There was suspicion for portal hypertension with possible varices versus a distal esophageal hyperenhancement.  There was no signs of steatosis. Patient has significant fatigue low energy decreased activity tolerance cough joint pains.  She has had intermittent rashes but has none currently.  She has had a recent eye exam with no reported abnormalities. We discussed all of her test and lab results. Has very strong family history of sarcoidosis with 3 maternal uncles, her brother and a maternal aunt that all have sarcoidosis. Patient has had intentional weight loss says that she has been on a diet and is down greater than 89 pounds. She denies any hemoptysis bloody stools hematemesis, bruising.  We had a long discussion regarding her abnormalities in her lab work and recent MRI of her liver and spleen.  We discussed the need to proceed with probable bronchoscopy with biopsy and referral to GI for consideration for further work-up of liver involvement and abnormalities. Unfortunately patient's medical insurance is going to stop July 16, 2020.  She declines flu shot and Covid vaccines  Patient works independently as a Management consultant.    TEST/EVENTS :  Chest x-ray September 29, 2017 clear lungs no acute process  Renal CT April 2017-no focal abnormalities noted in the liver, spleen or pancreas, nonobstructive left nephrolithiasis, moderate right hydroureter nephrosis  CT chest without contrast May 29, 2020 showed perilymphatic, peribronchovascular and subpleural distribution of pulmonary nodularity throughout right apical nodule 5 mm and nodularity along the left major fissure measuring 9 mm.  Heterogeneous density throughout the liver.,  Possible mild bilateral hilar adenopathy  MR abdomen July 08, 2020 hyperenhancing area in the medial segment of the left hepatic lobe, background of nodular hepatic contours and  hepatic heterogeneity atypical for sarcoid associated lesion given arterial enhancement other areas more typical for sarcoid but remain nonspecific.,  Splenic lesions noted.  Potential portal hypertension with varices versus distal esophageal hyperenhancement, no signs of steatosis  Labs June 19, 2020: QuantiFERON gold negative, Sjogren's negative, LFTs normal, rheumatoid factor positive, CCP negative, INR 1.1, allergy profile negative, IgE 67, Anti-DNA antibody negative, hepatic panel normal, platelets normal, WBC 7.1 ACE level elevated 82, sed rate greater than 130, vitamin D 8, hemoglobin 9.3 hematocrit 28  2D echo July 07, 2020 EF 55 to 60%, mild LVH, pulmonary artery pressure normal  Previous iron levels were noted low  HIV 2019 neg.   OV 11/22/2021  Subjective:  Patient ID: Ashley Henry, female , DOB: 08/25/1976 , age 6 y.o. , MRN: [id number] , ADDRESS: [address] [place] [address] PCP Eulas Post, MD Patient Care Team: Eulas Post, MD as PCP - General  This Provider for this visit: Treatment Team:  Attending Provider: Brand Males, MD    11/22/2021 -   Chief Complaint  Patient presents with   Follow-up    Pt states she is coughing a lot each day and also  states that her skin has been breaking out. States first thing in the morning she will cough up a lot of phlegm. States that she does have increased SOB especially when exercising.     ICD-10-CM   1. Sarcoidosis  D86.9     2. Chronic cough  R05.3     3. Dyspnea on exertion  R06.09     4. Mediastinal adenopathy  R59.0     5. Vitamin D deficiency  E55.9     6. Anemia, unspecified type  D64.9     7. Rheumatoid factor positive  R76.8     8. Abnormal CT of liver  R93.2     9. Pigmented skin lesions  L81.9        HPI FAELYN SIGLER 47 y.o. -returns for follow-up.  Last seen personally in 2021.  At that time the concern was all his symptoms are pointing toward sarcoidosis.  She  then saw a nurse practitioner Patricia Nettle in October 2021.  She was confirmed to have sarcoidosis clinically based on high angiotensin-converting enzyme, African-American ethnicity, age and symptoms and CT scan findings of the lung.  She was then prescribed prednisone for 3 months she says during this time all the symptoms resolved.  She did not have insurance so did not do any further work-up.  She says then early last year she ran out of prednisone and since then having significant symptoms.  The symptoms include significant amount of night sweats, shortness of breath and wheezing.  Also severe cough day and night.  In the early morning the cough is productive with yellow sputum but rest of the day the cough is dry.  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  She showed a voice recording of the cough and there is clear laryngeal component to the cough.  Symptoms are rated as severe.  Cough not responding to Tessalon cough.  During this time she had skin lesions and arthralgia.  The skin lesions on the hands forehead.  There is also drying of the palms.  There is arthralgia diffusely.  Vitamin D deficiency: This was diagnosed in 2021.  Not on treatment currently  Anemia: This was diagnosed in 2021.  Not on treatment currently   Liver lesion seen on MRI along with splenic lesions: These were reported as atypical for sarcoid.  She never saw a GI  Skin lesions: This is new since last visit.  She is open to seeing dermatologist.      OV 12/18/2021  Subjective:  Patient ID: Ashley Henry, female , DOB: 1976-03-15 , age 70 y.o. , MRN: [id number] , ADDRESS: [address] [place] [address] PCP Eulas Post, MD Patient Care Team: Eulas Post, MD as PCP - General    12/18/2021 -this video visit is to review test results done due to concern of sarcoidosis.   HPI DELAYNEE ALRED 47 y.o. -since her last visit a few weeks ago she continues to have  the same symptoms of arthralgia and shortness of breath.  And skin lesions.  Referred her to the dermatologist and GI because of concerns of splenic lesions.  She has not yet seen them.  At this point in time she is focused on getting through symptom relief.  We did do investigations and they are as follows  CT scan of the chest: Shows worsening sarcoidosis.  I visualized the film and also showed it to the video  Pulmonary function test: Not yet done  Blood work: Shows worsening angiotensin-converting enzyme level to greater than 180.  Persistently high ESR.Marland Kitchen  Rheumatoid factor continues to be trace positive.  Continues to be iron deficient anemic with a hemoglobin 9.9 g% which is baseline since 2014.  She says primary care physician has not yet addressed this.  IMPRESSION: 1. There has been dramatic increase in parenchymal lung involvement with extensive progressive perilymphatic nodularity, as detailed above, compatible with worsening sarcoidosis. 2. Multiple large low-attenuation splenic lesions, nonspecific, but likely reflective of sarcoid involvement in the spleen. 3. Dilatation of the pulmonic trunk (3.7 cm in diameter), concerning for pulmonary arterial hypertension. 4. Aortic atherosclerosis.   Aortic Atherosclerosis (ICD10-I70.0).     Electronically Signed   By: Vinnie Langton M.D.   On: 12/12/2021 07:41   01/28/2022 -   Chief Complaint  Patient presents with   Follow-up    PFT performed 01/25/22. Pt states she has been doing okay since last visit and denies any complaints.     HPI GWENDOLYNN MERKEY 47 y.o. -last office visit April 2023 on video.  Presents with her daughter who is at the bedside.  Diagnosis worsening sarcoidosis symptomatic given.  Commenced on prednisone.  I wrote for prednisone taper of 40 mg x 2 weeks and then 20 mg x 2 weeks.  The pharmacist informed that I skipped a dose of 30 mg for 2 weeks.  Therefore she took 40 mg for 2 weeks and then 30 mg for 2  weeks.  She is currently on 20 mg daily and she is finished 1 week of that.  She wants to extend the taper and go to 10 mg/day and then continue that per instructions.  The prednisone is really helped shortness of breath is improved cough is improved.  Her skin rash in the face and hands have improved.  Nevertheless she is having prednisone side effects of increased hunger 10 pound weight gain decreased sleep increased alertness.  Also facial puffiness.  Nevertheless she is continuing with the prednisone and she is okay doing that.  We did get an echocardiogram.  Previous echocardiogram 2021 was normal.  Currently is reporting moderate to severe mitral regurgitation and rater than 4 cm aortic root dilatation.  I did inform her of these findings.  I also passed a message on to Dr. Bettina Gavia her cardiologist.  Pulmonary function shows mild restriction with mild reduction in diffusion capacity.    OV 05/14/2022  Subjective:  Patient ID: Ashley Henry, female , DOB: 1976/02/01 , age 6 y.o. , MRN: [id number] , ADDRESS: [address] [place] [address] PCP Eulas Post, MD Patient Care Team: Eulas Post, MD as PCP - General  This Provider for this visit: Treatment Team:  Attending Provider: Brand Males, MD    05/14/2022 -   Chief Complaint  Patient presents with   Follow-up    PFT performed today.  Pt states she is about the same compared to last visit.   Follow-up pulmonary sarcoidosis last CT scan March 2023  HPI ARCHITA LOMELI 47 y.o. -returns for follow-up.  At the last visit her symptoms and the skin lesions were better after prednisone dosage.  We started tapering the prednisone.  Today she presents for follow-up.  She is now on 10 mg/day prednisone but she says with the taper cough is worse shortness of breath is worse wheezing is worse.  In addition skin lesions are also getting worse.  She  is still gaining weight though she states she is gained another 10  pounds of weight on the current 10 mg of prednisone.  She is frustrated by the worsening symptoms.  She did a pulmonary function test today.  This shows worsening of FVC and FEV1 however she did have significant amount of cough which is part of her symptoms from the sarcoid.  Therefore the results are somewhat unreliable.  Her daughter is here with her today.  She had normal G6PD testing are slightly high back in May 2023 March 2023 the QuantiFERON gold was negative. She has iron deficiency anemia when last checked in March 2023  She has mitral valve regurgitation and she saw Dr. Bettina Gavia and a TEE is pending very shortly.  She does admit to history of palpitations.  She does not recall having MRI of the heart or some kind of arrhythmia monitoring program.  I have written to Dr. Bettina Gavia about this.  We discussed the fact that she has progressive pulm sarcoidosis symptoms and the prednisone is causing side effects and also in addition with the prednisone taper she is having more symptoms.  Last visit we discussed the second line methotrexate therapy.  She is open to the idea.  We went over the side effects in detail including common and rare side effects and the requirement for significant amount of blood monitoring.  She is agreeable to this.  We discussed referral to [facility] or [facility] for clinical trial participation by [employer] but she is not interested.  We discussed about seeing Dr. Rodman Pickle in our practice resident sarcoid expert but at this point in time she wants to hold off.       OV 08/15/2022  Subjective:  Patient ID: Ashley Henry, female , DOB: 06/16/1976 , age 25 y.o. , MRN: [id number] , ADDRESS: [address] [place] [address] PCP Eulas Post, MD Patient Care Team: Eulas Post, MD as PCP - General  This Provider for this visit: Treatment Team:  Attending Provider: Brand Males, MD  Follow-up pulmonary sarcoidosis last CT scan March  2023  08/15/2022 -   Chief Complaint  Patient presents with   Follow-up    Doing good with prednisone currently     HPI HANNAH MEDSKER 47 y.o. -returns for follow-up.  At this point in time she is back on prednisone 10 mg/day.  She finds this dose to be helpful for her.  Early in the morning she has severe cough that last 30 to 60 minutes and it has white sputum.  Then she takes a prednisone 10 mg/day and the cough goes away.  Overall quality of life is better no shortness of breath rest of the day there is no cough.  Her skin lesions in the face have resolved.  The skin lesions in the hands do persist but they are improved.  She is open to getting a dermatology referral right now.  She did have mitral regurgitation on the 2D echo when she had a TEE and it is only mild.  This was in August 2023 and I reviewed that result.  At this point in time she is content taking prednisone 10 mg/day for her sarcoid.  She does not want methotrexate.  In the past she declined to clinical trials.  Review of the labs indicate that she had anemia in March 2023.  I will will offer her repeat CBC testing.  We discussed vaccination she has had a COVID-vaccine but  not her flu.  She has never had a flu shot and does not intend to.     OV 09/24/2022  Subjective:  Patient ID: Ashley Henry, female , DOB: 02-14-1976 , age 52 y.o. , MRN: [id number] , ADDRESS: [address] [place] [address] PCP Eulas Post, MD Patient Care Team: Eulas Post, MD as PCP - General  This Provider for this visit: Treatment Team:  Attending Provider: Brand Males, MD    09/24/2022 -   Chief Complaint  Patient presents with   Follow-up    Breathing has been good,winded with walking.      HPI JOLINDA NEUNER 47 y.o. -returns for follow-up with her daughter.  She is giving the history.  She tells me since last visit she has had 2 trips to [place] 1 time in November 2023 and the other 1 just this past  weekend.  She is having insidious onset of dyspnea on exertion relieved by rest.  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  Is been going on for 3 or 4 weeks relieved by rest.  She is also reporting bilateral ankle swelling around the same time.  Particularly with sitting.  She went [place] by car and it was definitely present.  Her baseline cough early in the morning for 30 minutes is unchanged.  This cough is made worse by perfume and smoke but now also present with the new onset of dyspnea.  She does have very streaky mild hemoptysis and thick yellow sputum but this is baseline.  Her hemoglobin was last checked a year ago and she was anemic.  I reviewed this result again.  She is wondering about getting a repeat CT scan of the chest to reassess her sarcoid.  She had a TEE in summer 2023 and she only had mild mitral valve prolapse.  I reviewed this result again.     OV 12/06/2022  Subjective:  Patient ID: Ashley Henry, female , DOB: 01-14-1976 , age 46 y.o. , MRN: [id number] , ADDRESS: [address] [place] [address] PCP Eulas Post, MD Patient Care Team: Eulas Post, MD as PCP - General  This Provider for this visit: Treatment Team:  Attending Provider: Brand Males, MD    12/06/2022 -   Chief Complaint  Patient presents with   Follow-up    Sarcoidosis f/up     HPI Ashley Henry 47 y.o. -returns for follow-up.  She has not had a pulmonary function test today.  She states that she is on 10 mg prednisone taking it compliant.  Shortness of breath is improved although there is residual shortness of breath.  Ankle edema from previous visit is also resolved.  She is walking and this is helping her.  Her skin lesions are also improved.  However there are side effects from the prednisone her hemoglobin A1c in January 2024 is gone up to greater than 7.  Her blood pressure today is elevated 180/120.  This the  first time it has been elevated.  She is not on antihypertensives.  Iron deficiency anemia continues.  She states she is not taking iron pills because she does not want to.  She is also got diminished sleep 25% of the time.  She is open to reducing her prednisone to 7.5 mg/day.  Her cardiologist Dr. Bettina Gavia is retiring.  She is upcoming cardiology appointment with a new cardiologist  in May 2024.  Otherwise no new issues.      PFT     Latest Ref Rng & Units 05/14/2022    3:33 PM 01/25/2022   12:10 PM  PFT Results  FVC-Pre L 1.95  2.30   FVC-Predicted Pre % 53  76   FVC-Post L  2.16   FVC-Predicted Post %  71   Pre FEV1/FVC % % 63  88   Post FEV1/FCV % %  85   FEV1-Pre L 1.22  2.02   FEV1-Predicted Pre % 41  82   FEV1-Post L  1.83   DLCO uncorrected ml/min/mmHg  15.20   DLCO UNC% %  70   DLCO corrected ml/min/mmHg  15.20   DLCO COR %Predicted %  70   DLVA Predicted %  122   TLC L  3.17   TLC % Predicted %  62   RV % Predicted %  54      Latest Reference Range & Units 05/11/13 11:09 05/26/14 12:40 07/19/15 09:29 07/27/15 09:24 10/15/15 00:41 12/26/15 08:00 06/17/16 12:43 09/26/17 14:27 09/29/17 14:20 05/31/19 13:23 06/19/20 12:15 11/22/21 09:55 09/24/22 09:57  Hemoglobin 12.0 - 15.0 g/dL 9.9 (L) 9.7 (L) 10.0 (L) 9.6 (L) 9.7 (L) 9.2 (L) 9.2 (L) 9.6 (L) 9.8 (L) 10.2 (L) 9.3 (L) 9.8 (L) 9.7 (L)  (L): Data is abnormally low   Latest Reference Range & Units 11/22/09 09:09 05/11/13 11:09 05/26/14 12:40 07/19/15 09:29 10/15/15 00:41 12/26/15 08:00 06/17/16 12:43 09/26/17 14:27 09/29/17 14:20 06/19/20 12:17 11/22/21 09:55 09/24/22 09:57  Creatinine 0.40 - 1.20 mg/dL 0.40 - 1.20 mg/dL 0.8 0.90 0.85 0.90 1.30 (H) 1.05 (H) 0.91 1.14 1.14 (H) 1.04 1.12 1.23 (H) 1.23 (H)  (H): Data is abnormally high    Latest Reference Range & Units 07/27/15 09:24 09/29/17 14:20 01/28/22 10:23 09/24/22 09:57  Hemoglobin A1C 4.6 - 6.5 % 6.2 (H) 6.2 (H) 6.5 7.2 (H)  (H): Data is abnormally high  reports  that she has never smoked. She has never used smokeless tobacco.  Past Surgical History:  Procedure Laterality Date   [place] STUDY  05/16/2022   Procedure: BUBBLE STUDY;  Surgeon: Fay Records, MD;  Location: [facility] ENDOSCOPY;  Service: Cardiovascular;;   CESAREAN SECTION  2004   Cysts removed from chest wall     EYE SURGERY Left    MASS EXCISION N/A 04/14/2018   Procedure: EXCISION OF CHEST WALL MASS;  Surgeon: Coralie Keens, MD;  Location: [place];  Service: General;  Laterality: N/A;   TEE WITHOUT CARDIOVERSION N/A 05/16/2022   Procedure: TRANSESOPHAGEAL ECHOCARDIOGRAM (TEE);  Surgeon: Fay Records, MD;  Location: [facility] ENDOSCOPY;  Service: Cardiovascular;  Laterality: N/A;    Allergies  Allergen Reactions   Multihance [Gadobenate] Nausea And Vomiting   Amlodipine Nausea Only   Collagenase Hives   Collagenase Clostridium Histolyticum Hives   Aspirin Rash    Immunization History  Administered Date(s) Administered   PFIZER(Purple Top)SARS-COV-2 Vaccination 12/30/2020, 01/20/2021, 06/30/2021   PPD Test 03/29/2020   Td 09/04/2009   Td (Adult),5 Lf Tetanus Toxid, Preservative Free 09/04/2009   Tdap 09/04/2009    Family History  Problem Relation Age of Onset   Hypertension Mother    Diabetes Mother    Hypertension Father    Stomach cancer Paternal Grandmother    Stomach cancer Maternal Uncle    Heart disease Maternal Uncle    Lung cancer Maternal Grandfather    Prostate cancer  Maternal Uncle    Thyroid disease Maternal Uncle      Current Outpatient Medications:    albuterol (PROVENTIL) (2.5 MG/3ML) 0.083% nebulizer solution, Take 3 mLs (2.5 mg total) by nebulization every 6 (six) hours as needed for wheezing or shortness of breath., Disp: 75 mL, Rfl: 5   albuterol (VENTOLIN HFA) 108 (90 Base) MCG/ACT inhaler, INHALE 2 PUFFS INTO THE LUNGS EVERY 6 HOURS AS NEEDED FOR WHEEZING OR SHORTNESS OF BREATH, Disp: 18 g, Rfl: 2    benzonatate (TESSALON) 100 MG capsule, TAKE 1 CAPSULE(100 MG) BY MOUTH THREE TIMES DAILY AS NEEDED FOR COUGH, Disp: 40 capsule, Rfl: 2   BEPREVE 1.5 % SOLN, Place 1 drop into both eyes daily., Disp: 10 mL, Rfl: 5   cyclobenzaprine (FLEXERIL) 5 MG tablet, TAKE 1 TO 2 TABLETS BY MOUTH EVERY NIGHT AS NEEDED FOR MUSCLE SPASMS, Disp: 60 tablet, Rfl: 1   ferrous sulfate (SLOW IRON) 160 (50 Fe) MG TBCR SR tablet, Take 1 tablet (160 mg total) by mouth daily., Disp: 30 tablet, Rfl: 3   fluticasone (CUTIVATE) 0.05 % cream, Apply topically 2 (two) times daily., Disp: 15 g, Rfl: 0   Norgestimate-Ethinyl Estradiol Triphasic (TRI-LO-SPRINTEC) 0.18/0.215/0.25 MG-25 MCG tab, Take 1 tablet by mouth daily., Disp: 28 tablet, Rfl: 3   Norgestimate-Ethinyl Estradiol Triphasic (TRI-LO-SPRINTEC) 0.18/0.215/0.25 MG-25 MCG tab, TAKE 1 TABLET BY MOUTH DAILY, Disp: 28 tablet, Rfl: 1   omeprazole (PRILOSEC) 40 MG capsule, TAKE 1 CAPSULE(40 MG) BY MOUTH DAILY, Disp: 30 capsule, Rfl: 1   predniSONE (DELTASONE) 10 MG tablet, Take 10 mg by mouth daily with breakfast., Disp: , Rfl:    predniSONE (DELTASONE) 10 MG tablet, Take 1 tablet (10 mg total) by mouth daily with breakfast., Disp: 30 tablet, Rfl: 3      Objective:   Vitals:   12/06/22 0941  Pulse: (!) 124  SpO2: 98%  Weight: 274 lb (124.3 kg)  Height: 5\' 4"  (1.626 m)    Estimated body mass index is 47.03 kg/m as calculated from the following:   Height as of this encounter: 5\' 4"  (1.626 m).   Weight as of this encounter: 274 lb (124.3 kg).  @WEIGHTCHANGE @  [employer]   12/06/22 0941  Weight: 274 lb (124.3 kg)     Physical Exam    General: No distress. Looks well Neuro: Alert and Oriented x 3. GCS 15. Speech normal Psych: Pleasant Resp:  Barrel Chest - no.  Wheeze - no, Crackles - no, No overt respiratory distress CVS: Normal heart sounds. Murmurs - no Ext: Stigmata of Connective Tissue Disease - no HEENT: Normal upper airway. PEERL +. No post  nasal drip        Assessment:       ICD-10-CM   1. Sarcoidosis  D86.9     2. Dyspnea on exertion  R06.09     3. Pigmented skin lesions  L81.9     4. High risk medication use  Z79.899     5. Medication monitoring encounter  Z51.81     6. Hypertension, unspecified type  I10     7. 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  No cough either.  REsidual shortness of breath is due to anemia.  Prednisone causing side efffects  Plan - continue prednisone  but drop to 7.5mg  per day - spiromery and dlco in 12 weeks -hold off methotrexate or referral to [place] for clinical trials - referral to [facility] pharmacist for counseling annd education on INFLIXIMAB in case you chose that in future - respect deferral on flu vaccine   HYPERTENSION 180/120 on 12/06/2022 Elevated HgbA1c Jan 2024  - likely prednisone contributing  Plan - meet with PCP Eulas Post, MD regarding BP and sugar  - recheck HgBA1c 12/06/2022     Anemia, unspecified type -noted in October 2021. Persistent on March 2023  Seems like Iron Deficiency anemia. Ongoing jan 2024. Not taking iron pills per personal choice  Plan -monitor and support - encourage taking iron pills if you are able to but respect and support your choice not to - please talk to PCP Burchette, Alinda Sierras, MD   Pigmented skin lesions  -Very suspicious of sarcoidosis.  Glad improved Vonzell Schlatter on prednisone 10mg  per day    Plan - monitor for relapse with lower prednisone of 7.5mg  pre day  Rheumatoid factor positive -mildly positive in 2021 and persistent though March 2023 Associted arthralgia  - can be sarcoid arthritis versus RA but glad joint pain improved  and not much  this visit 12/06/2022   Plan  - monitor -  - at some point can decide on rheumatology referral   Abnormal CT of liver along with MRI of the liver and spleen in 2021 ad in CT March 2023   Plan - Refer to gastroenterology some time in future   HY   Follow-up - 8 -12 weeks with Dr Chase Caller - 30 min visit but after spiro/dlco    SIGNATURE    Dr. Brand Males, M.D., F.C.C.P,  Pulmonary and Critical Care Medicine Staff Physician, [place] Director - Interstitial Lung Disease  Program  Pulmonary [place] at [place], [place], [address]  Pager: [phone], If no answer or between  15:00h - 7:00h: call [phone] Telephone: [phone]  10:02 AM 12/06/2022

## 2022-12-06 NOTE — Patient Instructions (Addendum)
Sarcoidosis Chronic cough Dyspnea on exertion Mediastinal adenopathy Current use of chronic steroids   - Shortness of breath improved/stable on prednisone 10mg  per day.  No cough either.  REsidual shortness of breath is due to anemia. Prednisone causing side efffects  Plan - continue prednisone  but drop to 7.5mg  per day - spiromery and dlco in 12 weeks -hold off methotrexate or referral to [facility] for clinical trials - referral to [facility] pharmacist for counseling annd education on INFLIXIMAB in case you chose that in future - respect deferral on flu vaccine   HYPERTENSION 180/120 on 12/06/2022 Elevated HgbA1c Jan 2024  - likely prednisone contributing  Plan - meet with PCP Eulas Post, MD regarding BP and sugar  - recheck HgBA1c 12/06/2022     Anemia, unspecified type -noted in October 2021. Persistent on March 2023  Seems like Iron Deficiency anemia. Ongoing jan 2024. Not taking iron pills per personal choice  Plan -monitor and support - encourage taking iron pills if you are able to but respect and support your choice not to - please talk to PCP Burchette, Alinda Sierras, MD   Pigmented skin lesions  -Very suspicious of sarcoidosis.  Glad improved /resolved on prednisone 10mg  per day    Plan - monitor for relapse with lower prednisone of 7.5mg  pre day  Rheumatoid factor positive -mildly positive in 2021 and persistent though March 2023 Associted arthralgia  - can be sarcoid arthritis versus RA but glad joint pain improved  and not much this visit 12/06/2022   Plan  - monitor -  - at some point can decide on rheumatology referral   Abnormal CT of liver along with MRI of the liver and spleen in 2021 ad in CT March 2023   Plan - Refer to gastroenterology some time in future   HY   Follow-up - 8 -12 weeks with Dr Chase Caller - 30 min visit but after spiro/dlco

## 2022-12-08 ENCOUNTER — Telehealth: Payer: Self-pay | Admitting: Internal Medicine

## 2022-12-08 NOTE — Telephone Encounter (Signed)
[facility]/Rx team  Would you be kind enough to do a pharmacy consult (virtual ok) for Inflixiamb in [place]. She is not intereted in methotrexated but you could compare and contrast . This is for her education and knowledget to take informed decision if need arose. She is having side effects from steroids already (sugar, bp)  Thanks    SIGNATURE    Dr. Brand Males, M.D., F.C.C.P,  Pulmonary and Critical Care Medicine Staff Physician, [place] Director - Interstitial Lung Disease  Program  Medical Director - [facility] [place] at [place], [place], [address]   Pager: [phone], If no answer  -[facility] or [facility] [phone] Telephone (clinical office): [phone] Telephone (research): [phone]  11:18 PM 12/08/2022

## 2022-12-11 ENCOUNTER — Telehealth (INDEPENDENT_AMBULATORY_CARE_PROVIDER_SITE_OTHER): Payer: Commercial Managed Care - HMO | Admitting: Family Medicine

## 2022-12-11 ENCOUNTER — Encounter: Payer: Self-pay | Admitting: Family Medicine

## 2022-12-11 ENCOUNTER — Other Ambulatory Visit: Payer: Self-pay | Admitting: Family Medicine

## 2022-12-11 DIAGNOSIS — I10 Essential (primary) hypertension: Secondary | ICD-10-CM | POA: Diagnosis not present

## 2022-12-11 MED ORDER — HYDROCHLOROTHIAZIDE 25 MG PO TABS: 25.0000 mg | ORAL_TABLET | Freq: Every day | ORAL | 0 refills | Status: DC

## 2022-12-11 MED ORDER — AMLODIPINE BESYLATE 5 MG PO TABS: 5.0000 mg | ORAL_TABLET | Freq: Every day | ORAL | 0 refills | Status: DC

## 2022-12-11 NOTE — Progress Notes (Signed)
Subjective:    Patient ID: Ashley Henry, female    DOB: [birth year], 47 y.o.   MRN: [id number]  HPI Virtual Visit via Video Note  I connected with the patient on 12/11/22 at  8:30 AM EDT by a video enabled telemedicine application and verified that I am speaking with the correct person using two identifiers.  Location patient: home Location provider:work or home office Persons participating in the virtual visit: patient, provider  I discussed the limitations of evaluation and management by telemedicine and the availability of in person appointments. The patient expressed understanding and agreed to proceed.   HPI: Here for high BP. She has a hx of HTN and she took Amlodipine for awhile several years ago. She was able to come off this however by losing weight. Now I see that she has gained 30 lbs in the past year. She takes Prednisone for sarcoidosis. She has had some mild intermittent headaches and SOB for several weeks. She saw Dr. Chase Caller a few days ago, and her BP was 177/121. She was told to see [place] asap. Then at work yesterday her BP was 180/120. She had labs in January showing a creatinine of 1.23 and an A1c of 7.2.    ROS: See pertinent positives and negatives per HPI.  Past Medical History:  Diagnosis Date   Abnormal CT scan of lung 07/13/2020   Abnormal finding on imaging of liver 07/13/2020   Anemia    BACK PAIN, UPPER 07/27/2010   Qualifier: Diagnosis of  By: Elease Hashimoto MD, Bruce     BRONCHITIS, ACUTE 07/16/2010   Qualifier: Diagnosis of  By: Elease Hashimoto MD, Jerrol Banana, GREAT TOE 01/08/2010   Qualifier: Diagnosis of  By: Elease Hashimoto MD, Bruce     Enlarged uterus 05/13/2011   Essential hypertension 09/07/2009   Qualifier: Diagnosis of  By: Ronnald Ramp MD, Arvid Right.    HEADACHE 08/03/2010   Qualifier: Diagnosis of  By: Elease Hashimoto MD, Bruce     HYPERLIPIDEMIA 10/11/2009   Hypertension 2010   no meds now   INGROWN NAIL 02/08/2010   Qualifier: Diagnosis of  By: Elease Hashimoto  MD, Bruce     Iron deficiency anemia due to chronic blood loss 06/20/2014   Kidney stone 10/30/2015   OBESITY 10/11/2009   Qualifier: Diagnosis of  By: Elease Hashimoto MD, Bruce     PARESTHESIA 08/03/2010   Qualifier: Diagnosis of  By: Elease Hashimoto MD, Bruce     Pre-diabetes    HgbA1c 6.2 on 09-30-17   Renal disorder    SINUSITIS, ACUTE 12/07/2009   Qualifier: Diagnosis of  By: Elease Hashimoto MD, Bruce     SORE THROAT 02/14/2010   Qualifier: Diagnosis of  By: Valma Cava LPN, Madilyn Hook D deficiency 07/13/2020    Past Surgical History:  Procedure Laterality Date   [place] STUDY  05/16/2022   Procedure: BUBBLE STUDY;  Surgeon: Fay Records, MD;  Location: [facility];  Service: Cardiovascular;;   CESAREAN SECTION  2004   Cysts removed from chest wall     EYE SURGERY Left    MASS EXCISION N/A 04/14/2018   Procedure: EXCISION OF CHEST WALL MASS;  Surgeon: Coralie Keens, MD;  Location: [facility];  Service: General;  Laterality: N/A;   TEE WITHOUT CARDIOVERSION N/A 05/16/2022   Procedure: TRANSESOPHAGEAL ECHOCARDIOGRAM (TEE);  Surgeon: Fay Records, MD;  Location: [facility];  Service: Cardiovascular;  Laterality: N/A;    Family  History  Problem Relation Age of Onset   Hypertension Mother    Diabetes Mother    Hypertension Father    Stomach cancer Paternal Grandmother    Stomach cancer Maternal Uncle    Heart disease Maternal Uncle    Lung cancer Maternal Grandfather    Prostate cancer Maternal Uncle    Thyroid disease Maternal Uncle      Current Outpatient Medications:    albuterol (PROVENTIL) (2.5 MG/3ML) 0.083% nebulizer solution, Take 3 mLs (2.5 mg total) by nebulization every 6 (six) hours as needed for wheezing or shortness of breath., Disp: 75 mL, Rfl: 5   albuterol (VENTOLIN HFA) 108 (90 Base) MCG/ACT inhaler, INHALE 2 PUFFS INTO THE LUNGS EVERY 6 HOURS AS NEEDED FOR WHEEZING OR SHORTNESS OF BREATH, Disp: 18 g, Rfl: 2    benzonatate (TESSALON) 100 MG capsule, TAKE 1 CAPSULE(100 MG) BY MOUTH THREE TIMES DAILY AS NEEDED FOR COUGH, Disp: 40 capsule, Rfl: 2   BEPREVE 1.5 % SOLN, Place 1 drop into both eyes daily., Disp: 10 mL, Rfl: 5   cyclobenzaprine (FLEXERIL) 5 MG tablet, TAKE 1 TO 2 TABLETS BY MOUTH EVERY NIGHT AS NEEDED FOR MUSCLE SPASMS, Disp: 60 tablet, Rfl: 0   ferrous sulfate (SLOW IRON) 160 (50 Fe) MG TBCR SR tablet, Take 1 tablet (160 mg total) by mouth daily., Disp: 30 tablet, Rfl: 3   fluticasone (CUTIVATE) 0.05 % cream, Apply topically 2 (two) times daily., Disp: 15 g, Rfl: 0   Norgestimate-Ethinyl Estradiol Triphasic (TRI-LO-SPRINTEC) 0.18/0.215/0.25 MG-25 MCG tab, Take 1 tablet by mouth daily., Disp: 28 tablet, Rfl: 3   Norgestimate-Ethinyl Estradiol Triphasic (TRI-LO-SPRINTEC) 0.18/0.215/0.25 MG-25 MCG tab, TAKE 1 TABLET BY MOUTH DAILY, Disp: 28 tablet, Rfl: 1   omeprazole (PRILOSEC) 40 MG capsule, TAKE 1 CAPSULE(40 MG) BY MOUTH DAILY, Disp: 30 capsule, Rfl: 1   predniSONE (DELTASONE) 10 MG tablet, Take 10 mg by mouth daily with breakfast., Disp: , Rfl:    predniSONE (DELTASONE) 10 MG tablet, Take 1 tablet (10 mg total) by mouth daily with breakfast., Disp: 30 tablet, Rfl: 3   predniSONE (DELTASONE) 5 MG tablet, Take 1.5 tablets (7.5 mg total) by mouth daily with breakfast., Disp: 90 tablet, Rfl: 1  EXAM:  VITALS per patient if applicable:  GENERAL: alert, oriented, appears well and in no acute distress  HEENT: atraumatic, conjunttiva clear, no obvious abnormalities on inspection of external nose and ears  NECK: normal movements of the head and neck  LUNGS: on inspection no signs of respiratory distress, breathing rate appears normal, no obvious gross SOB, gasping or wheezing  CV: no obvious cyanosis  MS: moves all visible extremities without noticeable abnormality  PSYCH/NEURO: pleasant and cooperative, no obvious depression or anxiety, speech and thought processing grossly  intact  ASSESSMENT AND PLAN: Uncontrolled HTN. We will start her on Amlodipine 5 mg daily and HCTZ 25 mg daily. She will follow up with [place] in 2 weeks.  Alysia Penna, MD  Discussed the following assessment and plan:  No diagnosis found.     I discussed the assessment and treatment plan with the patient. The patient was provided an opportunity to ask questions and all were answered. The patient agreed with the plan and demonstrated an understanding of the instructions.   The patient was advised to call back or seek an in-person evaluation if the symptoms worsen or if the condition fails to improve as anticipated.      Review of Systems     Objective:   Physical  Exam        Assessment & Plan:

## 2022-12-16 NOTE — Telephone Encounter (Signed)
ATC patient to discuss infliximab for sarcoidosis. Generally this is reserved as treatment-refractory option so actually may not even be approved by insurance.  Left VM requesting return call at her convenience  Knox Saliva, PharmD, MPH, BCPS, CPP Clinical Pharmacist (Rheumatology and Pulmonology)

## 2022-12-27 NOTE — Telephone Encounter (Signed)
ATC patient to discuss infliximab for sarcoidosis. Unable to reach. Left VM requesting return call at her convenience. MyChart message sent to patient  Chesley Mires, PharmD, MPH, BCPS, CPP Clinical Pharmacist (Rheumatology and Pulmonology)

## 2023-01-01 NOTE — Telephone Encounter (Addendum)
ATC #3 patient regarding infliximab for sarcoidosis. Unable to reach. Left VM advising that this will be last attempt to discuss. Will close encounter if no f/u by 01/10/23  Chesley Mires, PharmD, MPH, BCPS, CPP Clinical Pharmacist (Rheumatology and Pulmonology)

## 2023-01-08 ENCOUNTER — Other Ambulatory Visit: Payer: Self-pay | Admitting: Family Medicine

## 2023-01-20 ENCOUNTER — Ambulatory Visit: Payer: Commercial Managed Care - HMO | Attending: Cardiology | Admitting: Cardiology

## 2023-01-20 ENCOUNTER — Ambulatory Visit: Payer: Commercial Managed Care - HMO | Attending: Cardiology

## 2023-01-20 ENCOUNTER — Encounter: Payer: Self-pay | Admitting: Cardiology

## 2023-01-20 VITALS — BP 168/90 | HR 117 | Ht 63.0 in | Wt 270.0 lb

## 2023-01-20 DIAGNOSIS — I340 Nonrheumatic mitral (valve) insufficiency: Secondary | ICD-10-CM | POA: Diagnosis not present

## 2023-01-20 DIAGNOSIS — R0609 Other forms of dyspnea: Secondary | ICD-10-CM | POA: Diagnosis not present

## 2023-01-20 DIAGNOSIS — R002 Palpitations: Secondary | ICD-10-CM

## 2023-01-20 DIAGNOSIS — D869 Sarcoidosis, unspecified: Secondary | ICD-10-CM

## 2023-01-20 DIAGNOSIS — I10 Essential (primary) hypertension: Secondary | ICD-10-CM | POA: Diagnosis not present

## 2023-01-20 NOTE — Patient Instructions (Signed)
Medication Instructions:  Your physician recommends that you continue on your current medications as directed. Please refer to the Current Medication list given to you today.  *If you need a refill on your cardiac medications before your next appointment, please call your pharmacy*   Lab Work: BMP, CBC Today 3rd Floor [address] If you have labs (blood work) drawn today and your tests are completely normal, you will receive your results only by: MyChart Message (if you have MyChart) OR A paper copy in the mail If you have any lab test that is abnormal or we need to change your treatment, we will call you to review the results.   Testing/Procedures: You are scheduled for Cardiac MRI. Please arrive at the [facility] main entrance of [facility] (30-45 minutes prior to test start time). ?  [facility]  [address]  [place], [place] [address]  [phone]  Proceed to the [facility] Radiology Department (First Floor).  ?  Magnetic resonance imaging (MRI) is a painless test that produces images of the inside of the body without using X-rays. During an MRI, strong magnets and radio waves work together in a Data processing manager to form detailed images. MRI images may provide more details about a medical condition than X-rays, CT scans, and ultrasounds can provide.  You may be given earphones to listen for instructions.  You may eat a light breakfast and take medications as ordered with the exception of HCTZ (fluid pill, other). If a contrast material will be used, an IV will be inserted into one of your veins. Contrast material will be injected into your IV.  You will be asked to remove all metal, including: Watch, jewelry, and other metal objects including hearing aids, hair pieces and dentures. (Braces and fillings normally are not a problem.)  If contrast material was used:  It will leave your body through your urine within a day. You may be told to drink plenty of fluids  to help flush the contrast material out of your system.  TEST WILL TAKE APPROXIMATELY 1 HOUR  PLEASE NOTIFY SCHEDULING AT LEAST 24 HOURS IN ADVANCE IF YOU ARE UNABLE TO KEEP YOUR APPOINTMENT.       WHY IS MY DOCTOR PRESCRIBING ZIO? The Zio system is proven and trusted by physicians to detect and diagnose irregular heart rhythms -- and has been prescribed to hundreds of thousands of patients.  The FDA has cleared the Zio system to monitor for many different kinds of irregular heart rhythms. In a study, physicians were able to reach a diagnosis 90% of the time with the Zio system1.  You can wear the Zio monitor -- a small, discreet, comfortable patch -- during your normal day-to-day activity, including while you sleep, shower, and exercise, while it records every single heartbeat for analysis.  1Barrett, P., et al. Comparison of 24 Hour Holter Monitoring Versus 14 Day Novel Adhesive Patch Electrocardiographic Monitoring. American Journal of Medicine, 2014.  ZIO VS. HOLTER MONITORING The Zio monitor can be comfortably worn for up to 14 days. Holter monitors can be worn for 24 to 48 hours, limiting the time to record any irregular heart rhythms you may have. Zio is able to capture data for the 51% of patients who have their first symptom-triggered arrhythmia after 48 hours.1  LIVE WITHOUT RESTRICTIONS The Zio ambulatory cardiac monitor is a small, unobtrusive, and water-resistant patch--you might even forget you're wearing it. The Zio monitor records and stores every beat of your  heart, whether you're sleeping, working out, or showering.    Your physician has requested that you have an echocardiogram. Echocardiography is a painless test that uses sound waves to create images of your heart. It provides your doctor with information about the size and shape of your heart and how well your heart's chambers and valves are working. This procedure takes approximately one hour. There are no restrictions for  this procedure. Please do NOT wear cologne, perfume, aftershave, or lotions (deodorant is allowed). Please arrive 15 minutes prior to your appointment time.   Follow-Up: At [facility], you and your health needs are our priority.  As part of our continuing mission to provide you with exceptional heart care, we have created designated Provider Care Teams.  These Care Teams include your primary Cardiologist (physician) and Advanced Practice Providers (APPs -  Physician Assistants and Nurse Practitioners) who all work together to provide you with the care you need, when you need it.  We recommend signing up for the patient portal called "MyChart".  Sign up information is provided on this After Visit Summary.  MyChart is used to connect with patients for Virtual Visits (Telemedicine).  Patients are able to view lab/test results, encounter notes, upcoming appointments, etc.  Non-urgent messages can be sent to your provider as well.   To learn more about what you can do with MyChart, go to [email or web address].    Your next appointment:   3 month(s)  The format for your next appointment:   In Person  Provider:   Gypsy Balsam, MD    Other Instructions NA

## 2023-01-20 NOTE — Progress Notes (Signed)
Cardiology Office Note:    Date:  01/20/2023   ID:  Ashley Henry, DOB [birth year], MRN [id number]  PCP:  Kristian Covey, MD  Cardiologist:  Gypsy Balsam, MD    Referring MD: Kristian Covey, MD   Chief Complaint  Patient presents with   Establish Care    History of Present Illness:    Ashley Henry is a 47 y.o. female with past medical history significant for sarcoidosis, cardiac wise since she was evaluated by my partner Dr. Dulce Sellar secondary to mitral regurgitation.  It was described as severe however a TEE done thereafter last year showed only mild mitral regurgitation.  She comes today to my office for follow-up.  Overall she seems to be doing fair.  She complained of having shortness of breath and fatigue, we will schedule her to have echocardiogram to assess left ventricle ejection fraction and degree of mitral regurgitation.  She also complained of having some palpitations which you mean by that heart will speed up.  Will put Zio patch as well as will schedule her to have MRI.  She did have some swelling of lower extremities.  She takes amlodipine for her blood pressure which contribute to that.  DVT study of lower extremities was negative.  There is no syncope no passing out  Past Medical History:  Diagnosis Date   Abnormal CT scan of lung 07/13/2020   Abnormal finding on imaging of liver 07/13/2020   Anemia    BACK PAIN, UPPER 07/27/2010   Qualifier: Diagnosis of  By: Caryl Never MD, Bruce     BRONCHITIS, ACUTE 07/16/2010   Qualifier: Diagnosis of  By: Caryl Never MD, Carlena Hurl, GREAT TOE 01/08/2010   Qualifier: Diagnosis of  By: Caryl Never MD, Bruce     Enlarged uterus 05/13/2011   Essential hypertension 09/07/2009   Qualifier: Diagnosis of  By: Yetta Barre MD, Bernadene Bell.    HEADACHE 08/03/2010   Qualifier: Diagnosis of  By: Caryl Never MD, Bruce     HYPERLIPIDEMIA 10/11/2009   Hypertension 2010   no meds now   INGROWN NAIL 02/08/2010   Qualifier: Diagnosis of   By: Caryl Never MD, Bruce     Iron deficiency anemia due to chronic blood loss 06/20/2014   Kidney stone 10/30/2015   OBESITY 10/11/2009   Qualifier: Diagnosis of  By: Caryl Never MD, Bruce     PARESTHESIA 08/03/2010   Qualifier: Diagnosis of  By: Caryl Never MD, Bruce     Pre-diabetes    HgbA1c 6.2 on 09-30-17   Renal disorder    SINUSITIS, ACUTE 12/07/2009   Qualifier: Diagnosis of  By: Caryl Never MD, Bruce     SORE THROAT 02/14/2010   Qualifier: Diagnosis of  By: Gabriel Rung LPN, Sherron Ales D deficiency 07/13/2020    Past Surgical History:  Procedure Laterality Date   BREAST REDUCTION SURGERY  1999   BUBBLE STUDY  05/16/2022   Procedure: BUBBLE STUDY;  Surgeon: Pricilla Riffle, MD;  Location: [facility] ENDOSCOPY;  Service: Cardiovascular;;   CESAREAN SECTION  2004   Cysts removed from chest wall     EYE SURGERY Left    MASS EXCISION N/A 04/14/2018   Procedure: EXCISION OF CHEST WALL MASS;  Surgeon: Abigail Miyamoto, MD;  Location: [facility];  Service: General;  Laterality: N/A;   TEE WITHOUT CARDIOVERSION N/A 05/16/2022   Procedure: TRANSESOPHAGEAL ECHOCARDIOGRAM (TEE);  Surgeon: Pricilla Riffle, MD;  Location: [facility] ENDOSCOPY;  Service: Cardiovascular;  Laterality: N/A;  Current Medications: Current Meds  Medication Sig   albuterol (PROVENTIL) (2.5 MG/3ML) 0.083% nebulizer solution Take 3 mLs (2.5 mg total) by nebulization every 6 (six) hours as needed for wheezing or shortness of breath.   albuterol (VENTOLIN HFA) 108 (90 Base) MCG/ACT inhaler INHALE 2 PUFFS INTO THE LUNGS EVERY 6 HOURS AS NEEDED FOR WHEEZING OR SHORTNESS OF BREATH (Patient taking differently: Inhale 2 puffs into the lungs every 6 (six) hours as needed for wheezing or shortness of breath.)   amLODipine (NORVASC) 5 MG tablet Take 1 tablet (5 mg total) by mouth daily.   benzonatate (TESSALON) 100 MG capsule TAKE 1 CAPSULE(100 MG) BY MOUTH THREE TIMES DAILY AS NEEDED FOR COUGH (Patient taking differently: Take 100 mg by  mouth 3 (three) times daily as needed for cough. TAKE 1 CAPSULE(100 MG) BY MOUTH THREE TIMES DAILY AS NEEDED FOR COUGH)   BEPREVE 1.5 % SOLN Place 1 drop into both eyes daily.   cyclobenzaprine (FLEXERIL) 5 MG tablet TAKE 1 TO 2 TABLETS BY MOUTH EVERY NIGHT AS NEEDED FOR MUSCLE SPASMS (Patient taking differently: Take 1-2 tablets by mouth at bedtime as needed for muscle spasms. TAKE 1 TO 2 TABLETS BY MOUTH EVERY NIGHT AS NEEDED FOR MUSCLE SPASMS)   ferrous sulfate (SLOW IRON) 160 (50 Fe) MG TBCR SR tablet Take 1 tablet (160 mg total) by mouth daily.   fluticasone (CUTIVATE) 0.05 % cream Apply topically 2 (two) times daily. (Patient taking differently: Apply 1 Application topically 2 (two) times daily.)   hydrochlorothiazide (HYDRODIURIL) 25 MG tablet Take 1 tablet (25 mg total) by mouth daily.   Norgestimate-Ethinyl Estradiol Triphasic (TRI-LO-SPRINTEC) 0.18/0.215/0.25 MG-25 MCG tab Take 1 tablet by mouth daily.   Norgestimate-Ethinyl Estradiol Triphasic (TRI-LO-SPRINTEC) 0.18/0.215/0.25 MG-25 MCG tab TAKE 1 TABLET BY MOUTH DAILY   omeprazole (PRILOSEC) 40 MG capsule TAKE 1 CAPSULE(40 MG) BY MOUTH DAILY (Patient taking differently: Take 40 mg by mouth daily. TAKE 1 CAPSULE(40 MG) BY MOUTH DAILY)   predniSONE (DELTASONE) 10 MG tablet Take 10 mg by mouth daily with breakfast.   predniSONE (DELTASONE) 10 MG tablet Take 1 tablet (10 mg total) by mouth daily with breakfast.   predniSONE (DELTASONE) 5 MG tablet Take 1.5 tablets (7.5 mg total) by mouth daily with breakfast.     Allergies:   Multihance [gadobenate], Collagenase, Collagenase clostridium histolyticum, and Aspirin   Social History   Socioeconomic History   Marital status: Married    Spouse name: [name]   Number of children: [name]   Years of education: [name]   Highest education level: [name]  Occupational History   [name]  Tobacco Use   Smoking status: Never   Smokeless tobacco: Never  Vaping Use   Vaping  Use: Never used  Substance and Sexual Activity   Alcohol use: No    Alcohol/week: 0.0 standard drinks of alcohol   Drug use: No   Sexual activity: Yes    Birth control/protection: Pill, Condom    Comment: 1st intercourse 47 yo-Fewer than 5 partners  Other Topics Concern   [name]  Social History Narrative   [name]   Social Determinants of Health   Financial Resource Strain: [name]  Food Insecurity: [name]  Transportation Needs: [name]  Physical Activity: [name]  Stress: [name]  Social Connections: [name]     Family History: The patient's family history includes Diabetes in  her mother; Heart disease in her maternal uncle; Hypertension in her father and mother; Lung cancer in her maternal grandfather; Prostate cancer in her maternal uncle; Stomach cancer in her maternal uncle and paternal grandmother; Thyroid disease in her maternal uncle. ROS:   Please see the history of present illness.    All 14 point review of systems negative except as described per history of present illness  EKGs/Labs/Other Studies Reviewed:      Recent Labs: 09/24/2022: ALT 17; BUN 14; BUN 14; Creatinine, Ser 1.23; Creatinine, Ser 1.23; Hemoglobin 9.7; Platelets 443.0; Potassium 4.3; Potassium 4.3; Pro B Natriuretic peptide (BNP) 86.0; Sodium 139; Sodium 139  Recent Lipid Panel    Component Value Date/Time   CHOL 203 (H) 09/29/2017 1420   TRIG 101 09/29/2017 1420   HDL 46 (L) 09/29/2017 1420   CHOLHDL 4.4 09/29/2017 1420   VLDL 16 06/17/2016 1243   LDLCALC 137 (H) 09/29/2017 1420    Physical Exam:    VS:  BP (!) 168/90 (BP Location: Left Arm, Patient Position: Sitting)   Pulse (!) 117   Ht 5\' 3"  (1.6 m)   Wt 270 lb (122.5 kg)   SpO2 96%   BMI 47.83 kg/m     Wt Readings from Last 3 Encounters:  01/20/23 270 lb (122.5 kg)  12/06/22 274 lb (124.3 kg)  09/24/22 270 lb (122.5 kg)     GEN:  Well nourished, well developed in no acute distress HEENT:  Normal NECK: No JVD; No carotid bruits LYMPHATICS: No lymphadenopathy CARDIAC: RRR, no murmurs, no rubs, no gallops RESPIRATORY:  Clear to auscultation without rales, wheezing or rhonchi  ABDOMEN: Soft, non-tender, non-distended MUSCULOSKELETAL:  No edema; No deformity  SKIN: Warm and dry LOWER EXTREMITIES: no swelling NEUROLOGIC:  Alert and oriented x 3 PSYCHIATRIC:  Normal affect   ASSESSMENT:    1. Essential hypertension   2. Nonrheumatic mitral valve regurgitation   3. Sarcoid    PLAN:    In order of problems listed above:  Essential hypertension, uncontrolled.  Will check Chem-7 if Chem-7 is fine we will start losartan. Nonrheumatic mitral regurgitation I cannot hear much murmur on the physical exam, echocardiogram will be done to recheck the degree of the valve. Sarcoidosis which is diagnosis that this is being worked out.  I will schedule her to have MRI make sure there is no heart involvement, I will also schedule her to wear Zio patch make sure she does not have any significant arrhythmia.   Medication Adjustments/Labs and Tests Ordered: Current medicines are reviewed at length with the patient today.  Concerns regarding medicines are outlined above.  No orders of the defined types were placed in this encounter.  Medication changes: No orders of the defined types were placed in this encounter.   Signed, Georgeanna Lea, MD, [facility] 01/20/2023 9:33 AM     [facility]

## 2023-01-20 NOTE — Addendum Note (Signed)
Addended by: Baldo Ash D on: 01/20/2023 10:05 AM   Modules accepted: Orders

## 2023-01-20 NOTE — Addendum Note (Signed)
Addended by: Baldo Ash D on: 01/20/2023 02:55 PM   Modules accepted: Orders

## 2023-01-21 LAB — BASIC METABOLIC PANEL
BUN/Creatinine Ratio: 15 (ref 9–23)
BUN: 20 mg/dL (ref 6–24)
CO2: 23 mmol/L (ref 20–29)
Calcium: 9.8 mg/dL (ref 8.7–10.2)
Chloride: 97 mmol/L (ref 96–106)
Creatinine, Ser: 1.33 mg/dL — ABNORMAL HIGH (ref 0.57–1.00)
Glucose: 132 mg/dL — ABNORMAL HIGH (ref 70–99)
Potassium: 4.7 mmol/L (ref 3.5–5.2)
Sodium: 138 mmol/L (ref 134–144)
eGFR: 50 mL/min/{1.73_m2} — ABNORMAL LOW (ref >59)

## 2023-01-21 LAB — HEMOGLOBIN AND HEMATOCRIT, BLOOD
Hematocrit: 32.9 % — ABNORMAL LOW (ref 34.0–46.6)
Hemoglobin: 9.9 g/dL — ABNORMAL LOW (ref 11.1–15.9)

## 2023-01-23 ENCOUNTER — Encounter (HOSPITAL_COMMUNITY): Payer: Self-pay | Admitting: Cardiology

## 2023-01-24 ENCOUNTER — Telehealth: Payer: Self-pay | Admitting: Cardiology

## 2023-01-24 NOTE — Telephone Encounter (Signed)
[facility] [employer] called with a prior auth for a cardiac MRI: approval good for dates: 01/20/23 through 07/19/23, auth # [id number], case ref #[id number].  If you have any questions they can be reached at [phone]

## 2023-01-29 ENCOUNTER — Telehealth: Payer: Self-pay

## 2023-01-29 DIAGNOSIS — I10 Essential (primary) hypertension: Secondary | ICD-10-CM

## 2023-01-29 MED ORDER — LOSARTAN POTASSIUM 25 MG PO TABS: 25.0000 mg | ORAL_TABLET | Freq: Every day | ORAL | 3 refills | Status: DC

## 2023-01-29 NOTE — Telephone Encounter (Signed)
-----   Message from Georgeanna Lea, MD sent at 01/23/2023  4:53 PM EDT ----- Chem-7 mildly elevated lets start losartan 25 mg daily, she must have Chem-7 done 7 days later

## 2023-02-05 ENCOUNTER — Other Ambulatory Visit: Payer: Self-pay | Admitting: Family Medicine

## 2023-02-12 ENCOUNTER — Other Ambulatory Visit: Payer: Self-pay | Admitting: Family Medicine

## 2023-02-12 ENCOUNTER — Ambulatory Visit (HOSPITAL_BASED_OUTPATIENT_CLINIC_OR_DEPARTMENT_OTHER)
Admission: RE | Admit: 2023-02-12 | Discharge: 2023-02-12 | Disposition: A | Discharge status: Home / Self Care | Payer: Commercial Managed Care - HMO | Source: Ambulatory Visit | Attending: Cardiology | Admitting: Cardiology

## 2023-02-12 DIAGNOSIS — R0609 Other forms of dyspnea: Secondary | ICD-10-CM | POA: Insufficient documentation

## 2023-02-12 LAB — ECHOCARDIOGRAM COMPLETE
Area-P 1/2: 5.79 cm2
MV M vel: 6.33 m/s
MV Peak grad: 160.3 mmHg
Radius: 0.4 cm
S' Lateral: 3.4 cm

## 2023-02-13 ENCOUNTER — Telehealth: Payer: Self-pay | Admitting: Cardiology

## 2023-02-13 ENCOUNTER — Telehealth: Payer: Self-pay

## 2023-02-13 NOTE — Telephone Encounter (Signed)
Left message on My Chart with ECHOresults per Dr. Vanetta Shawl note, per pt request.  Routed to PCP.

## 2023-02-13 NOTE — Telephone Encounter (Signed)
Patient was returning phone call about results. 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

## 2023-02-19 NOTE — Patient Instructions (Signed)
Sarcoidosis Chronic cough Dyspnea on exertion Mediastinal adenopathy Current use of chronic steroids   - Shortness of breath improved/stable on prednisone 10mg  per day.  No cough either.  REsidual shortness of breath is due to anemia. Prednisone causing side efffects  Plan - continue prednisone  but drop to 7.5mg  per day - spiromery and dlco in 12 weeks -hold off methotrexate or referral to [facility] for clinical trials - referral to [facility] pharmacist for counseling annd education on INFLIXIMAB in case you chose that in future - respect deferral on flu vaccine   HYPERTENSION 180/120 on 12/06/2022 Elevated HgbA1c Jan 2024  - likely prednisone contributing  Plan - meet with PCP Kristian Covey, MD regarding BP and sugar  - recheck HgBA1c 12/06/2022     Anemia, unspecified type -noted in October 2021. Persistent on March 2023  Seems like Iron Deficiency anemia. Ongoing jan 2024. Not taking iron pills per personal choice  Plan -monitor and support - encourage taking iron pills if you are able to but respect and support your choice not to - please talk to PCP Burchette, Elberta Fortis, MD   Pigmented skin lesions  -Very suspicious of sarcoidosis.  Glad improved /resolved on prednisone 10mg  per day    Plan - monitor for relapse with lower prednisone of 7.5mg  pre day  Rheumatoid factor positive -mildly positive in 2021 and persistent though March 2023 Associted arthralgia  - can be sarcoid arthritis versus RA but glad joint pain improved  and not much this visit 12/06/2022   Plan  - monitor -  - at some point can decide on rheumatology referral   Abnormal CT of liver along with MRI of the liver and spleen in 2021 ad in CT March 2023   Plan - Refer to gastroenterology some time in future   HY   Follow-up - 8 -12 weeks with Dr Marchelle Gearing - 30 min visit but after spiro/dlco

## 2023-02-19 NOTE — Progress Notes (Unsigned)
OV 06/19/2020  Subjective:  Patient ID: Ashley Henry, female , DOB: November 25, 1975 , age 47 y.o. , MRN: [id number] , ADDRESS: [address] [place] [address]  PCP Ashley Covey, MD Attending : Treatment Team:  Attending Provider: Kalman Shan, MD    06/19/2020 -   Chief Complaint  Patient presents with   Consult    Patient is here for a productive/dry cough that she has had for several months with brown/yellow thivk sputum. Shortness of breath with exertion, stong smells, to hot.      HPI Ashley Henry 47 y.o. -referred by primary care physician Burchette, Elberta Fortis, MD.  Patient is a psychotherapist.  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  Her 3 maternal uncles, her brother and her maternal aunt all have sarcoidosis.  Her uncles are deceased although from other reasons.  Her brother and her aunt are still living.  She tells me that in the last 6 months he has had insidious onset of chronic cough, shortness of breath, night sweats and chills.  These have persisted.  This is despite having an intentional 8 pound weight loss during this time.  She says cough is made worse by talking or exertion.  Similarly shortness of breath is made worse by exertion.  Also made worse by talking through the mask during counseling sessions.  Both are relieved by rest.  Cough is also relieved by not talking.  She does have nocturnal awakening with cough chest tightness and wheezing.  She also reports right infra axillary right upper quadrant pain with coughing.  She does feel chest tightness deep inside the chest.  She had CT scan of the chest that I personally visualized.  Shows mediastinal lymphadenopathy consistent with stage I sarcoidosis.  CT scan abdomen cuts on the chest CT show possible evidence of sarcoidosis in the liver.  MRI has been recommended.  There are associated palpitations and night sweats.  Cough is associated with sputum productin     IMPRESSION:  1.  Pulmonary parenchymal and thoracic nodal findings which are consistent with sarcoidosis. 2. Heterogeneous density throughout the liver. Possibly heterogeneous steatosis. Hepatic involvement of sarcoidosis could look similar. If there are right upper quadrant symptoms or abnormal liver function test, pre and post contrast abdominal MRI should be considered.     Electronically Signed   By: Jeronimo Greaves M.D.   On: 05/29/2020 10:59  ROS - per HPI  Results for Ashley Henry (MRN [id number]) as of 06/19/2020 09:03  Ref. Range 09/26/2017 14:27 10/13/2017 16:54 10/06/2018 14:47  Anti Nuclear Antibody (ANA) Latest Ref Range: NEGATIVE   NEGATIVE   SSA (Ro) (ENA) Antibody, IgG Latest Ref Range: <1.0 NEG AI <1.0 NEG    SSB (La) (ENA) Antibody, IgG Latest Ref Range: <1.0 NEG AI <1.0 NEG       07/13/2020 Follow up : Cough/? Sarcoid /Abnormal CT chest and MRI AB   47 year old female never smoker seen for pulmonary consult June 19, 2020 for 53-month history of chronic cough shortness of breath night sweats and abnormal CT chest suspicious for sarcoidosis.  Patient has a very strong family history of sarcoidosis.  Patient presents for a 3-week follow-up.  Patient was seen last visit for pulmonary consult for a 23-month history of cough shortness of breath night sweats and chills.  She was seen by her primary care provider.  Subsequent CT chest May 29, 2020 showed pulmonary nodularity, possible bilateral bilateral hilar adenopathy, liver abnormalities.  This was suspicious for possible sarcoidosis.  Lab work last visit showed an elevated ACE level at 82 and a high sed rate at greater than 130.  Her rheumatoid factor was positive but CCP was negative.  INR was slightly elevated at 1.1.  Patient anemia was stable with hemoglobin at 9.3.  Her platelets were normal.  Hepatic function testing was normal.  Patient was set up for an MRI abdomen that showed abnormalities with a hyperenhancing area in the medial  segment of the left hepatic lobe hepatic nodularity.  And splenic lesions.  There was suspicion for portal hypertension with possible varices versus a distal esophageal hyperenhancement.  There was no signs of steatosis. Patient has significant fatigue low energy decreased activity tolerance cough joint pains.  She has had intermittent rashes but has none currently.  She has had a recent eye exam with no reported abnormalities. We discussed all of her test and lab results. Has very strong family history of sarcoidosis with 3 maternal uncles, her brother and a maternal aunt that all have sarcoidosis. Patient has had intentional weight loss says that she has been on a diet and is down greater than 89 pounds. She denies any hemoptysis bloody stools hematemesis, bruising.  We had a long discussion regarding her abnormalities in her lab work and recent MRI of her liver and spleen.  We discussed the need to proceed with probable bronchoscopy with biopsy and referral to GI for consideration for further work-up of liver involvement and abnormalities. Unfortunately patient's medical insurance is going to stop July 16, 2020.  She declines flu shot and Covid vaccines  Patient works independently as a Airline pilot.    TEST/EVENTS :  Chest x-ray September 29, 2017 clear lungs no acute process  Renal CT April 2017-no focal abnormalities noted in the liver, spleen or pancreas, nonobstructive left nephrolithiasis, moderate right hydroureter nephrosis  CT chest without contrast May 29, 2020 showed perilymphatic, peribronchovascular and subpleural distribution of pulmonary nodularity throughout right apical nodule 5 mm and nodularity along the left major fissure measuring 9 mm.  Heterogeneous density throughout the liver.,  Possible mild bilateral hilar adenopathy  MR abdomen July 08, 2020 hyperenhancing area in the medial segment of the left hepatic lobe, background of nodular hepatic contours and  hepatic heterogeneity atypical for sarcoid associated lesion given arterial enhancement other areas more typical for sarcoid but remain nonspecific.,  Splenic lesions noted.  Potential portal hypertension with varices versus distal esophageal hyperenhancement, no signs of steatosis  Labs June 19, 2020: QuantiFERON gold negative, Sjogren's negative, LFTs normal, rheumatoid factor positive, CCP negative, INR 1.1, allergy profile negative, IgE 67, Anti-DNA antibody negative, hepatic panel normal, platelets normal, WBC 7.1 ACE level elevated 82, sed rate greater than 130, vitamin D 8, hemoglobin 9.3 hematocrit 28  2D echo July 07, 2020 EF 55 to 60%, mild LVH, pulmonary artery pressure normal  Previous iron levels were noted low  HIV 2019 neg.   OV 11/22/2021  Subjective:  Patient ID: Ashley Henry, female , DOB: [birth year] , age 56 y.o. , MRN: [id number] , ADDRESS: [address] [place] [address] [place] [address] PCP Ashley Covey, MD Patient Care Team: Ashley Covey, MD as PCP - General  This Provider for this visit: Treatment Team:  Attending Provider: Kalman Shan, MD    11/22/2021 -   Chief Complaint  Patient presents with   Follow-up    Pt states she is coughing a lot each day and also states that her skin  has been breaking out. States first thing in the morning she will cough up a lot of phlegm. States that she does have increased SOB especially when exercising.     ICD-10-CM   1. Sarcoidosis  D86.9     2. Chronic cough  R05.3     3. Dyspnea on exertion  R06.09     4. Mediastinal adenopathy  R59.0     5. Vitamin D deficiency  E55.9     6. Anemia, unspecified type  D64.9     7. Rheumatoid factor positive  R76.8     8. Abnormal CT of liver  R93.2     9. Pigmented skin lesions  L81.9        HPI Ashley Henry 47 y.o. -returns for follow-up.  Last seen personally in 2021.  At that time the concern was all his symptoms are pointing toward sarcoidosis.  She  then saw a nurse practitioner Rikki Spearing in October 2021.  She was confirmed to have sarcoidosis clinically based on high angiotensin-converting enzyme, African-American ethnicity, age and symptoms and CT scan findings of the lung.  She was then prescribed prednisone for 3 months she says during this time all the symptoms resolved.  She did not have insurance so did not do any further work-up.  She says then early last year she ran out of prednisone and since then having significant symptoms.  The symptoms include significant amount of night sweats, shortness of breath and wheezing.  Also severe cough day and night.  In the early morning the cough is productive with yellow sputum but rest of the day the cough is dry.  She works as a Airline pilot and as she talks the cough is even worse.  She showed a voice recording of the cough and there is clear laryngeal component to the cough.  Symptoms are rated as severe.  Cough not responding to Tessalon cough.  During this time she had skin lesions and arthralgia.  The skin lesions on the hands forehead.  There is also drying of the palms.  There is arthralgia diffusely.  Vitamin D deficiency: This was diagnosed in 2021.  Not on treatment currently  Anemia: This was diagnosed in 2021.  Not on treatment currently   Liver lesion seen on MRI along with splenic lesions: These were reported as atypical for sarcoid.  She never saw a GI  Skin lesions: This is new since last visit.  She is open to seeing dermatologist.      OV 12/18/2021  Subjective:  Patient ID: Ashley Henry, female , DOB: [birth year] , age 25 y.o. , MRN: [id number] , ADDRESS: [address] [place] [address] PCP Ashley Covey, MD Patient Care Team: Ashley Covey, MD as PCP - General    12/18/2021 -this video visit is to review test results done due to concern of sarcoidosis.   HPI Ashley Henry 47 y.o. -since her last visit a few weeks ago she continues to have  the same symptoms of arthralgia and shortness of breath.  And skin lesions.  Referred her to the dermatologist and GI because of concerns of splenic lesions.  She has not yet seen them.  At this point in time she is focused on getting through symptom relief.  We did do investigations and they are as follows  CT scan of the chest: Shows worsening sarcoidosis.  I visualized the film and also showed it to the video  Pulmonary function test: Not  yet done  Blood work: Shows worsening angiotensin-converting enzyme level to greater than 180.  Persistently high ESR.Marland Kitchen  Rheumatoid factor continues to be trace positive.  Continues to be iron deficient anemic with a hemoglobin 9.9 g% which is baseline since 2014.  She says primary care physician has not yet addressed this.  IMPRESSION: 1. There has been dramatic increase in parenchymal lung involvement with extensive progressive perilymphatic nodularity, as detailed above, compatible with worsening sarcoidosis. 2. Multiple large low-attenuation splenic lesions, nonspecific, but likely reflective of sarcoid involvement in the spleen. 3. Dilatation of the pulmonic trunk (3.7 cm in diameter), concerning for pulmonary arterial hypertension. 4. Aortic atherosclerosis.   Aortic Atherosclerosis (ICD10-I70.0).     Electronically Signed   By: Trudie Reed M.D.   On: 12/12/2021 07:41   01/28/2022 -   Chief Complaint  Patient presents with   Follow-up    PFT performed 01/25/22. Pt states she has been doing okay since last visit and denies any complaints.     HPI Ashley Henry 47 y.o. -last office visit April 2023 on video.  Presents with her daughter who is at the bedside.  Diagnosis worsening sarcoidosis symptomatic given.  Commenced on prednisone.  I wrote for prednisone taper of 40 mg x 2 weeks and then 20 mg x 2 weeks.  The pharmacist informed that I skipped a dose of 30 mg for 2 weeks.  Therefore she took 40 mg for 2 weeks and then 30 mg for 2  weeks.  She is currently on 20 mg daily and she is finished 1 week of that.  She wants to extend the taper and go to 10 mg/day and then continue that per instructions.  The prednisone is really helped shortness of breath is improved cough is improved.  Her skin rash in the face and hands have improved.  Nevertheless she is having prednisone side effects of increased hunger 10 pound weight gain decreased sleep increased alertness.  Also facial puffiness.  Nevertheless she is continuing with the prednisone and she is okay doing that.  We did get an echocardiogram.  Previous echocardiogram 2021 was normal.  Currently is reporting moderate to severe mitral regurgitation and rater than 4 cm aortic root dilatation.  I did inform her of these findings.  I also passed a message on to Dr. Dulce Sellar her cardiologist.  Pulmonary function shows mild restriction with mild reduction in diffusion capacity.    OV 05/14/2022  Subjective:  Patient ID: Ashley Henry, female , DOB: 11-Aug-1976 , age 53 y.o. , MRN: [id number] , ADDRESS: [address] [place] [address] PCP Ashley Covey, MD Patient Care Team: Ashley Covey, MD as PCP - General  This Provider for this visit: Treatment Team:  Attending Provider: Kalman Shan, MD    05/14/2022 -   Chief Complaint  Patient presents with   Follow-up    PFT performed today.  Pt states she is about the same compared to last visit.   Follow-up pulmonary sarcoidosis last CT scan March 2023  HPI Ashley Henry 47 y.o. -returns for follow-up.  At the last visit her symptoms and the skin lesions were better after prednisone dosage.  We started tapering the prednisone.  Today she presents for follow-up.  She is now on 10 mg/day prednisone but she says with the taper cough is worse shortness of breath is worse wheezing is worse.  In addition skin lesions are also getting worse.  She is still gaining weight  though she states she is gained another 10  pounds of weight on the current 10 mg of prednisone.  She is frustrated by the worsening symptoms.  She did a pulmonary function test today.  This shows worsening of FVC and FEV1 however she did have significant amount of cough which is part of her symptoms from the sarcoid.  Therefore the results are somewhat unreliable.  Her daughter is here with her today.  She had normal G6PD testing are slightly high back in May 2023 March 2023 the QuantiFERON gold was negative. She has iron deficiency anemia when last checked in March 2023  She has mitral valve regurgitation and she saw Dr. Dulce Sellar and a TEE is pending very shortly.  She does admit to history of palpitations.  She does not recall having MRI of the heart or some kind of arrhythmia monitoring program.  I have written to Dr. Dulce Sellar about this.  We discussed the fact that she has progressive pulm sarcoidosis symptoms and the prednisone is causing side effects and also in addition with the prednisone taper she is having more symptoms.  Last visit we discussed the second line methotrexate therapy.  She is open to the idea.  We went over the side effects in detail including common and rare side effects and the requirement for significant amount of blood monitoring.  She is agreeable to this.  We discussed referral to [facility] or [facility] for clinical trial participation by [employer] but she is not interested.  We discussed about seeing Dr. Mechele Collin in our practice resident sarcoid expert but at this point in time she wants to hold off.       OV 08/15/2022  Subjective:  Patient ID: Ashley Henry, female , DOB: February 19, 1976 , age 55 y.o. , MRN: [id number] , ADDRESS: [address] [place] [address] PCP Ashley Covey, MD Patient Care Team: Ashley Covey, MD as PCP - General  This Provider for this visit: Treatment Team:  Attending Provider: Kalman Shan, MD  Follow-up pulmonary sarcoidosis last CT scan March  2023  08/15/2022 -   Chief Complaint  Patient presents with   Follow-up    Doing good with prednisone currently     HPI Ashley Henry 47 y.o. -returns for follow-up.  At this point in time she is back on prednisone 10 mg/day.  She finds this dose to be helpful for her.  Early in the morning she has severe cough that last 30 to 60 minutes and it has white sputum.  Then she takes a prednisone 10 mg/day and the cough goes away.  Overall quality of life is better no shortness of breath rest of the day there is no cough.  Her skin lesions in the face have resolved.  The skin lesions in the hands do persist but they are improved.  She is open to getting a dermatology referral right now.  She did have mitral regurgitation on the 2D echo when she had a TEE and it is only mild.  This was in August 2023 and I reviewed that result.  At this point in time she is content taking prednisone 10 mg/day for her sarcoid.  She does not want methotrexate.  In the past she declined to clinical trials.  Review of the labs indicate that she had anemia in March 2023.  I will will offer her repeat CBC testing.  We discussed vaccination she has had a COVID-vaccine but not her flu.  She has never had a flu shot and does not intend to.     OV 09/24/2022  Subjective:  Patient ID: Ashley Henry, female , DOB: [birth year] , age 29 y.o. , MRN: [id number] , ADDRESS: [address] [place] [address] PCP Ashley Covey, MD Patient Care Team: Ashley Covey, MD as PCP - General  This Provider for this visit: Treatment Team:  Attending Provider: Kalman Shan, MD    09/24/2022 -   Chief Complaint  Patient presents with   Follow-up    Breathing has been good,winded with walking.      HPI Ashley Henry 47 y.o. -returns for follow-up with her daughter.  She is giving the history.  She tells me since last visit she has had 2 trips to [place] 1 time in November 2023 and the other 1 just this past  weekend.  She is having insidious onset of dyspnea on exertion relieved by rest.  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  Is been going on for 3 or 4 weeks relieved by rest.  She is also reporting bilateral ankle swelling around the same time.  Particularly with sitting.  She went [place] by car and it was definitely present.  Her baseline cough early in the morning for 30 minutes is unchanged.  This cough is made worse by perfume and smoke but now also present with the new onset of dyspnea.  She does have very streaky mild hemoptysis and thick yellow sputum but this is baseline.  Her hemoglobin was last checked a year ago and she was anemic.  I reviewed this result again.  She is wondering about getting a repeat CT scan of the chest to reassess her sarcoid.  She had a TEE in summer 2023 and she only had mild mitral valve prolapse.  I reviewed this result again.     OV 12/06/2022  Subjective:  Patient ID: Ashley Henry, female , DOB: 08-07-1976 , age 9 y.o. , MRN: [id number] , ADDRESS: [address] [place] [address] PCP Ashley Covey, MD Patient Care Team: Ashley Covey, MD as PCP - General  This Provider for this visit: Treatment Team:  Attending Provider: Kalman Shan, MD    12/06/2022 -   Chief Complaint  Patient presents with   Follow-up    Sarcoidosis f/up     HPI Ashley Henry 47 y.o. -returns for follow-up.  She has not had a pulmonary function test today.  She states that she is on 10 mg prednisone taking it compliant.  Shortness of breath is improved although there is residual shortness of breath.  Ankle edema from previous visit is also resolved.  She is walking and this is helping her.  Her skin lesions are also improved.  However there are side effects from the prednisone her hemoglobin A1c in January 2024 is gone up to greater than 7.  Her blood pressure today is elevated 180/120.  This the  first time it has been elevated.  She is not on antihypertensives.  Iron deficiency anemia continues.  She states she is not taking iron pills because she does not want to.  She is also got diminished sleep 25% of the time.  She is open to reducing her prednisone to 7.5 mg/day.  Her cardiologist Dr. Dulce Sellar is retiring.  She is upcoming cardiology appointment with a new cardiologist in May 2024.  Otherwise no new issues.         OV 02/20/2023  Subjective:  Patient ID: Ashley Henry, female , DOB: Aug 08, 1976 , age 42 y.o. , MRN: [id number] , ADDRESS: [address] [place] [address] PCP Ashley Covey, MD Patient Care Team: Ashley Covey, MD as PCP - General  This Provider for this visit: Treatment Team:  Attending Provider: Kalman Shan, MD    02/20/2023 -   Chief Complaint  Patient presents with   Follow-up    SOB with exertion.  Cough and wheeze persistent.     HPI Ashley Henry 47 y.o. -follow-up pulmonary sarcoidosis on prednisone  Returns for follow-up.  Presents with her daughter Nillie Ota.  She is now on a reduced dose of prednisone 7.5 mg/day.  So far there is no recurrence of rash.  In the past reducing prednisone is caused flareup of sarcoid.  However when prednisone is increased her blood pressure is high and her blood sugar goes up.  Most recent hemoglobin A1c 7.2.  She not on any treatment for this.  She ideally wants to come off the prednisone because this is prednisone related hyperglycemia.  She is agreed to drop her prednisone to 5 mg/day.  She is gotten hypertensive and since last visit she is on hydrochlorothiazide and also Cozaar.  Her blood pressure has improved with a systolic of 132 today.  From a respiratory standpoint her pulmonary function test is stable compared to August 2023 but is still down compared to 1 year ago.  If this is very similar at 1.98 L / 54% and DLCO of 12.47/58%.  We talked about steroid sparing options.  She is  quite interested at this time and infliximab.  We went over methotrexate again.  She is more fearful of methotrexate even though this is less immunosuppressive.  This because of the side effect profile and personal experience she has heard from other people.  We went over TNF alpha blockade infliximab.  Went over the side effect profile on up-to-date that includes infusion reactions abdominal symptoms headaches and also long-term risk for opportunistic infections.  She is interested in this because the schedule convenience and other reasons.  She wants to meet with the pharmacist about this.  She wants to go ahead.  She is particular interested in the steroid sparing effects of this.    In terms of other issues - She has new cardiologist Dr Bing Matter cardiac MRI was recommended with this being changed to PET scan because of dye allergy.  She has had a ZIO monitor but the results are pending.  Reviewed the external records on this.  PFT     Latest Ref Rng & Units 05/14/2022    3:33 PM 01/25/2022   12:10 PM  PFT Results  FVC-Pre L 1.95  2.30   FVC-Predicted Pre % 53  76   FVC-Post L  2.16   FVC-Predicted Post %  71   Pre FEV1/FVC % % 63  88   Post FEV1/FCV % %  85   FEV1-Pre L 1.22  2.02   FEV1-Predicted Pre % 41  82   FEV1-Post L  1.83   DLCO uncorrected ml/min/mmHg  15.20   DLCO UNC% %  70   DLCO corrected ml/min/mmHg  15.20   DLCO COR %Predicted %  70   DLVA Predicted %  122   TLC L  3.17   TLC % Predicted %  62   RV %  Predicted %  54        has a past medical history of Abnormal CT scan of lung (07/13/2020), Abnormal finding on imaging of liver (07/13/2020), Anemia, BACK PAIN, UPPER (07/27/2010), BRONCHITIS, ACUTE (07/16/2010), CELLULITIS, GREAT TOE (01/08/2010), Enlarged uterus (05/13/2011), Essential hypertension (09/07/2009), HEADACHE (08/03/2010), HYPERLIPIDEMIA (10/11/2009), Hypertension (2010), INGROWN NAIL (02/08/2010), Iron deficiency anemia due to chronic blood loss  (06/20/2014), Kidney stone (10/30/2015), OBESITY (10/11/2009), PARESTHESIA (08/03/2010), Pre-diabetes, Renal disorder, SINUSITIS, ACUTE (12/07/2009), SORE THROAT (02/14/2010), and Vitamin D deficiency (07/13/2020).   reports that she has never smoked. She has never used smokeless tobacco.  Past Surgical History:  Procedure Laterality Date   BREAST REDUCTION SURGERY  1999   BUBBLE STUDY  05/16/2022   Procedure: BUBBLE STUDY;  Surgeon: Pricilla Riffle, MD;  Location: [facility] ENDOSCOPY;  Service: Cardiovascular;;   CESAREAN SECTION  2004   Cysts removed from chest wall     EYE SURGERY Left    MASS EXCISION N/A 04/14/2018   Procedure: EXCISION OF CHEST WALL MASS;  Surgeon: Abigail Miyamoto, MD;  Location: [facility];  Service: General;  Laterality: N/A;   TEE WITHOUT CARDIOVERSION N/A 05/16/2022   Procedure: TRANSESOPHAGEAL ECHOCARDIOGRAM (TEE);  Surgeon: Pricilla Riffle, MD;  [facility] [facility];  Service: Cardiovascular;  Laterality: N/A;    Allergies  Allergen Reactions   Multihance [Gadobenate] Nausea And Vomiting   Collagenase Hives   Collagenase Clostridium Histolyticum Hives   Aspirin Rash    Immunization History  Administered Date(s) Administered   PFIZER(Purple Top)SARS-COV-2 Vaccination 12/30/2020, 01/20/2021, 06/30/2021   PPD Test 03/29/2020   Td 09/04/2009   Td (Adult),5 Lf Tetanus Toxid, Preservative Free 09/04/2009   Tdap 09/04/2009    Family History  Problem Relation Age of Onset   Hypertension Mother    Diabetes Mother    Hypertension Father    Stomach cancer Paternal Grandmother    Stomach cancer Maternal Uncle    Heart disease Maternal Uncle    Lung cancer Maternal Grandfather    Prostate cancer Maternal Uncle    Thyroid disease Maternal Uncle      Current Outpatient Medications:    albuterol (PROVENTIL) (2.5 MG/3ML) 0.083% nebulizer solution, Take 3 mLs (2.5 mg total) by nebulization every 6 (six) hours as needed for wheezing or shortness of breath.,  Disp: 75 mL, Rfl: 5   albuterol (VENTOLIN HFA) 108 (90 Base) MCG/ACT inhaler, INHALE 2 PUFFS INTO THE LUNGS EVERY 6 HOURS AS NEEDED FOR WHEEZING OR SHORTNESS OF BREATH (Patient taking differently: Inhale 2 puffs into the lungs every 6 (six) hours as needed for wheezing or shortness of breath.), Disp: 18 g, Rfl: 2   amLODipine (NORVASC) 5 MG tablet, Take 1 tablet (5 mg total) by mouth daily., Disp: 30 tablet, Rfl: 0   benzonatate (TESSALON) 100 MG capsule, Take 1 capsule (100 mg total) by mouth 3 (three) times daily as needed for cough. TAKE 1 CAPSULE(100 MG) BY MOUTH THREE TIMES DAILY AS NEEDED FOR COUGH, Disp: 15 capsule, Rfl: 0   BEPREVE 1.5 % SOLN, Place 1 drop into both eyes daily., Disp: 10 mL, Rfl: 5   cyclobenzaprine (FLEXERIL) 5 MG tablet, TAKE 1 TO 2 TABLETS BY MOUTH EVERY NIGHT AS NEEDED FOR MUSCLE SPASMS, Disp: 60 tablet, Rfl: 0   ferrous sulfate (SLOW IRON) 160 (50 Fe) MG TBCR SR tablet, Take 1 tablet (160 mg total) by mouth daily., Disp: 30 tablet, Rfl: 3   fluticasone (CUTIVATE) 0.05 % cream, Apply topically 2 (two) times daily. (  Patient taking differently: Apply 1 Application topically 2 (two) times daily.), Disp: 15 g, Rfl: 0   hydrochlorothiazide (HYDRODIURIL) 25 MG tablet, Take 1 tablet (25 mg total) by mouth daily., Disp: 30 tablet, Rfl: 0   losartan (COZAAR) 25 MG tablet, Take 1 tablet (25 mg total) by mouth daily., Disp: 90 tablet, Rfl: 3   Norgestimate-Ethinyl Estradiol Triphasic (TRI-LO-SPRINTEC) 0.18/0.215/0.25 MG-25 MCG tab, TAKE 1 TABLET BY MOUTH DAILY, Disp: 28 tablet, Rfl: 1   omeprazole (PRILOSEC) 40 MG capsule, TAKE 1 CAPSULE(40 MG) BY MOUTH DAILY (Patient taking differently: Take 40 mg by mouth daily. TAKE 1 CAPSULE(40 MG) BY MOUTH DAILY), Disp: 30 capsule, Rfl: 1   predniSONE (DELTASONE) 5 MG tablet, Take 1.5 tablets (7.5 mg total) by mouth daily with breakfast., Disp: 90 tablet, Rfl: 1   Norgestimate-Ethinyl Estradiol Triphasic (TRI-LO-SPRINTEC) 0.18/0.215/0.25 MG-25 MCG  tab, Take 1 tablet by mouth daily. (Patient not taking: Reported on 02/20/2023), Disp: 28 tablet, Rfl: 3   predniSONE (DELTASONE) 10 MG tablet, Take 10 mg by mouth daily with breakfast. (Patient not taking: Reported on 02/20/2023), Disp: , Rfl:    predniSONE (DELTASONE) 10 MG tablet, Take 1 tablet (10 mg total) by mouth daily with breakfast. (Patient not taking: Reported on 02/20/2023), Disp: 30 tablet, Rfl: 3      Objective:   Vitals:   02/20/23 1618  BP: (!) 132/90  Pulse: (!) 118  Temp: 97.6 F (36.4 C)  TempSrc: Oral  SpO2: 98%  Weight: 276 lb 6.4 oz (125.4 kg)  Height: 5\' 4"  (1.626 m)    Estimated body mass index is 47.44 kg/m as calculated from the following:   Height as of this encounter: 5\' 4"  (1.626 m).   Weight as of this encounter: 276 lb 6.4 oz (125.4 kg).  @WEIGHTCHANGE @  [employer]   02/20/23 1618  Weight: 276 lb 6.4 oz (125.4 kg)     Physical Exam   General: No distress. Looks stabl O2 at rest: no Cane present: no Sitting in wheel chair: no Frail: no Obese: YES Neuro: Alert and Oriented x 3. GCS 15. Speech normal Psych: Pleasant Resp:  Barrel Chest - no.  Wheeze - no, Crackles - no, No overt respiratory distress CVS: Normal heart sounds. Murmurs - no Ext: Stigmata of Connective Tissue Disease - no HEENT: Normal upper airway. PEERL +. No post nasal drip        Assessment:       ICD-10-CM   1. Sarcoidosis  D86.9     2. High risk medication use  Z79.899     3. Medication monitoring encounter  Z51.81          Plan:     Patient Instructions  Sarcoidosis Chronic cough Dyspnea on exertion Mediastinal adenopathy Current use of chronic steroids   - Shortness of breath and PFT stable on prednisone 7.5 mg per day.  No cough either.  Prednisone causing side efffects.  PFTs worse compared to May 2023 but stable compared to August 2023. -Noted workup for cardiac sarcoid under Dr. Kirtland Bouchard is ongoing  Plan -Glad you saw cardiology and are awaiting a  PET scan and you have had a ZIO monitor - continue prednisone  but drop to 5mg  per day -Respect no interest in methotrexate due to safety concern -Start process for INFLIXIMAB   -Refer  to our pharmacy team for counseling  -Also start paperwork  HYPERTENSION 180/120 on 12/06/2022 and 132/90 on 02/20/2023 Elevated HgbA1c Jan 2024 and March 2024  - likely  prednisone contributing  Plan - meet with PCP Ashley Covey, MD regarding BP and sugar    Anemia, unspecified type -noted in October 2021. Persistent on May 2024; hgb 9.7  Seems like Iron Deficiency anemia. Ongoing May 2024. Not taking iron pills per personal choice  Plan -monitor and support - encourage taking iron pills if you are able to but respect and support your choice not to - please talk to PCP Burchette, Elberta Fortis, MD   Pigmented skin lesions  -Very suspicious of sarcoidosis.  Glad improved /resolved on prednisone 10mg  per day and still under remission as of June 2024 with prednisone 7.5 mg/day    Plan - monitor for relapse with lower prednisone of 5mg  pre day  Rheumatoid factor positive -mildly positive in 2021 and persistent though March 2024 Associted arthralgia  - can be sarcoid arthritis versus RA but glad joint pain improved  and not much this visit 12/06/2022   Plan  - monitor -  - at some point can decide on rheumatology referral   Abnormal CT of liver along with MRI of the liver and spleen in 2021 ad in CT March 2023   Plan - Refer to gastroenterology some time in future   HY   Follow-up - 12 weeks with Dr Marchelle Gearing -15-minute visit  ( Level 05 visit: Estb 40-54 min  in  visit type: on-site physical face to visit  in total care time and counseling or/and coordination of care by this undersigned MD - Dr Kalman Shan. This includes one or more of the following on this same day 02/20/2023: pre-charting, chart review, note writing, documentation discussion of test results, diagnostic or  treatment recommendations, prognosis, risks and benefits of management options, instructions, education, compliance or risk-factor reduction. It excludes time spent by the CMA or office staff in the care of the patient. Actual time 42 min incl chart loading time last night)    SIGNATURE    Dr. Kalman Shan, M.D., F.C.C.P,  Pulmonary and Critical Care Medicine Staff Physician, [facility] Center Director - Interstitial Lung Disease  Program  Pulmonary Fibrosis [facility] at [facility] [place], [place], [address]  Pager: [phone], If no answer or between  15:00h - 7:00h: call [phone] Telephone: [phone]  4:44 PM 02/20/2023

## 2023-02-20 ENCOUNTER — Encounter: Payer: Self-pay | Admitting: Internal Medicine

## 2023-02-20 ENCOUNTER — Ambulatory Visit (INDEPENDENT_AMBULATORY_CARE_PROVIDER_SITE_OTHER): Payer: Commercial Managed Care - HMO | Admitting: Internal Medicine

## 2023-02-20 ENCOUNTER — Encounter: Payer: Self-pay | Admitting: Family Medicine

## 2023-02-20 VITALS — BP 132/90 | HR 118 | Temp 97.6°F | Ht 64.0 in | Wt 276.4 lb

## 2023-02-20 DIAGNOSIS — Z79899 Other long term (current) drug therapy: Secondary | ICD-10-CM

## 2023-02-20 DIAGNOSIS — Z5181 Encounter for therapeutic drug level monitoring: Secondary | ICD-10-CM

## 2023-02-20 DIAGNOSIS — R0609 Other forms of dyspnea: Secondary | ICD-10-CM

## 2023-02-20 DIAGNOSIS — D869 Sarcoidosis, unspecified: Secondary | ICD-10-CM

## 2023-02-20 DIAGNOSIS — L819 Disorder of pigmentation, unspecified: Secondary | ICD-10-CM

## 2023-02-20 DIAGNOSIS — I10 Essential (primary) hypertension: Secondary | ICD-10-CM

## 2023-02-20 DIAGNOSIS — E119 Type 2 diabetes mellitus without complications: Secondary | ICD-10-CM

## 2023-02-20 NOTE — Progress Notes (Signed)
Spirometry and DLCO completed today  ?

## 2023-02-21 ENCOUNTER — Other Ambulatory Visit: Payer: Self-pay | Admitting: Pharmacist

## 2023-02-21 ENCOUNTER — Encounter: Payer: Self-pay | Admitting: Family Medicine

## 2023-02-21 ENCOUNTER — Telehealth (INDEPENDENT_AMBULATORY_CARE_PROVIDER_SITE_OTHER): Payer: Commercial Managed Care - HMO | Admitting: Family Medicine

## 2023-02-21 ENCOUNTER — Telehealth: Payer: Self-pay | Admitting: Pharmacy Technician

## 2023-02-21 VITALS — Ht 64.0 in | Wt 276.4 lb

## 2023-02-21 DIAGNOSIS — R21 Rash and other nonspecific skin eruption: Secondary | ICD-10-CM | POA: Diagnosis not present

## 2023-02-21 DIAGNOSIS — I10 Essential (primary) hypertension: Secondary | ICD-10-CM

## 2023-02-21 LAB — PULMONARY FUNCTION TEST
DL/VA % pred: 116 %
DL/VA: 5.07 ml/min/mmHg/L
DLCO cor % pred: 58 %
DLCO cor: 12.47 ml/min/mmHg
DLCO unc % pred: 58 %
DLCO unc: 12.47 ml/min/mmHg
FEF 25-75 Pre: 0.73 L/sec
FEF2575-%Pred-Pre: 24 %
FEV1-%Pred-Pre: 37 %
FEV1-Pre: 1.11 L
FEV1FVC-%Pred-Pre: 68 %
FEV6-%Pred-Pre: 55 %
FEV6-Pre: 1.98 L
FEV6FVC-%Pred-Pre: 102 %
FVC-%Pred-Pre: 54 %
FVC-Pre: 1.98 L
Pre FEV1/FVC ratio: 56 %
Pre FEV6/FVC Ratio: 100 %

## 2023-02-21 MED ORDER — HYDROCORTISONE BUTYRATE 0.1 % EX CREA: 1.0000 | TOPICAL_CREAM | Freq: Two times a day (BID) | CUTANEOUS | 2 refills | Status: AC

## 2023-02-21 MED ORDER — AMLODIPINE BESYLATE 5 MG PO TABS: 5.0000 mg | ORAL_TABLET | Freq: Every day | ORAL | 5 refills | Status: DC

## 2023-02-21 NOTE — Telephone Encounter (Signed)
Auth Submission: PENDING Site of care: Site of care: [facility] Payer: [employer] Medication & CPT/J Code(s) submitted: Avsola (infliximab-axxq) [id number] Route of submission (phone, fax, portal):  Phone # [phone] Fax # [phone] Auth type: Buy/Bill Units/visits requested:  Reference number:  Approval from:  to

## 2023-02-21 NOTE — Telephone Encounter (Signed)
Noted.  These were reviewed with patient at visit this morning  Ashley Covey MD [facility] at [facility]

## 2023-02-21 NOTE — Progress Notes (Signed)
Patient ID: Ashley Henry, female   DOB: [birth year], 47 y.o.   MRN: [id number]   Virtual Visit via Video Note  I connected with Derinda Late on 02/21/23 at  7:30 AM EDT by a video enabled telemedicine application and verified that I am speaking with the correct person using two identifiers.  Location patient: home Location provider:work or home office Persons participating in the virtual visit: patient, provider  I discussed the limitations of evaluation and management by telemedicine and the availability of in person appointments. The patient expressed understanding and agreed to proceed.   HPI: Jensen has hypertension, sarcoidosis, history of kidney stones.  She is followed closely by pulmonary regarding her sarcoidosis.  Currently on prednisone 7.5 mg daily.  She has been limited in exercise because of some chronic dyspnea.  She has some granulomatous type rash on her extremities and also dry scaly rash on her anterior chest.  She is looking at possible additional systemic agents to help control her sarcoid.  She has taken Locoid cream in the past for dry itchy rash and would like to basically get refill of that.  She has tried various over-the-counter moisturizers without much improvement.  She sent in a few pictures of her rash involving her hand/wrist, and anterior chest and these were reviewed  She also calls regarding hypertension which has been poorly controlled.  She saw cardiology back in May and was started on losartan 25 mg daily.  She had been on amlodipine 5 mg daily.  It was not clear that she was supposed to come off the amlodipine but she had the understanding that she would stop the amlodipine and start losartan.  In any event, she is having consistent elevated blood pressure since then.  She had readings recently of 178/110 last Saturday and 138/90 yesterday.  She actually took one of her father's combination pills with amlodipine benazepril 10/20 and had better blood pressure  response after couple days.  She tries to watch sodium intake.  Not exercising much recently.  Has had some mild peripheral edema previously with amlodipine   ROS: See pertinent positives and negatives per HPI.  Past Medical History:  Diagnosis Date   Abnormal CT scan of lung 07/13/2020   Abnormal finding on imaging of liver 07/13/2020   Anemia    BACK PAIN, UPPER 07/27/2010   Qualifier: Diagnosis of  By: Caryl Never MD, Taiquan Campanaro     BRONCHITIS, ACUTE 07/16/2010   Qualifier: Diagnosis of  By: Caryl Never MD, Carlena Hurl, GREAT TOE 01/08/2010   Qualifier: Diagnosis of  By: Caryl Never MD, Sherill Mangen     Enlarged uterus 05/13/2011   Essential hypertension 09/07/2009   Qualifier: Diagnosis of  By: Yetta Barre MD, Bernadene Bell.    HEADACHE 08/03/2010   Qualifier: Diagnosis of  By: Caryl Never MD, Takiya Belmares     HYPERLIPIDEMIA 10/11/2009   Hypertension 2010   no meds now   INGROWN NAIL 02/08/2010   Qualifier: Diagnosis of  By: Caryl Never MD, Millianna Szymborski     Iron deficiency anemia due to chronic blood loss 06/20/2014   Kidney stone 10/30/2015   OBESITY 10/11/2009   Qualifier: Diagnosis of  By: Caryl Never MD, Rector Devonshire     PARESTHESIA 08/03/2010   Qualifier: Diagnosis of  By: Caryl Never MD, Delberta Folts     Pre-diabetes    HgbA1c 6.2 on 09-30-17   Renal disorder    SINUSITIS, ACUTE 12/07/2009   Qualifier: Diagnosis of  By: Caryl Never MD, Shamere Campas     SORE THROAT  02/14/2010   Qualifier: Diagnosis of  By: Gabriel Rung LPN, Sherron Ales D deficiency 07/13/2020    Past Surgical History:  Procedure Laterality Date   BREAST REDUCTION SURGERY  1999   BUBBLE STUDY  05/16/2022   Procedure: BUBBLE STUDY;  Surgeon: Pricilla Riffle, MD;  Location: [facility];  Service: Cardiovascular;;   CESAREAN SECTION  2004   Cysts removed from chest wall     EYE SURGERY Left    MASS EXCISION N/A 04/14/2018   Procedure: EXCISION OF CHEST WALL MASS;  Surgeon: Abigail Miyamoto, MD;  Location: [facility];  Service: General;  Laterality:  N/A;   TEE WITHOUT CARDIOVERSION N/A 05/16/2022   Procedure: TRANSESOPHAGEAL ECHOCARDIOGRAM (TEE);  Surgeon: Pricilla Riffle, MD;  Location: [facility] ENDOSCOPY;  Service: Cardiovascular;  Laterality: N/A;    Family History  Problem Relation Age of Onset   Hypertension Mother    Diabetes Mother    Hypertension Father    Stomach cancer Paternal Grandmother    Stomach cancer Maternal Uncle    Heart disease Maternal Uncle    Lung cancer Maternal Grandfather    Prostate cancer Maternal Uncle    Thyroid disease Maternal Uncle     SOCIAL HX: Non-smoker.  No alcohol.  Works as a Airline pilot   Current Outpatient Medications:    albuterol (PROVENTIL) (2.5 MG/3ML) 0.083% nebulizer solution, Take 3 mLs (2.5 mg total) by nebulization every 6 (six) hours as needed for wheezing or shortness of breath., Disp: 75 mL, Rfl: 5   albuterol (VENTOLIN HFA) 108 (90 Base) MCG/ACT inhaler, INHALE 2 PUFFS INTO THE LUNGS EVERY 6 HOURS AS NEEDED FOR WHEEZING OR SHORTNESS OF BREATH (Patient taking differently: Inhale 2 puffs into the lungs every 6 (six) hours as needed for wheezing or shortness of breath.), Disp: 18 g, Rfl: 2   benzonatate (TESSALON) 100 MG capsule, Take 1 capsule (100 mg total) by mouth 3 (three) times daily as needed for cough. TAKE 1 CAPSULE(100 MG) BY MOUTH THREE TIMES DAILY AS NEEDED FOR COUGH, Disp: 15 capsule, Rfl: 0   BEPREVE 1.5 % SOLN, Place 1 drop into both eyes daily., Disp: 10 mL, Rfl: 5   cyclobenzaprine (FLEXERIL) 5 MG tablet, TAKE 1 TO 2 TABLETS BY MOUTH EVERY NIGHT AS NEEDED FOR MUSCLE SPASMS, Disp: 60 tablet, Rfl: 0   ferrous sulfate (SLOW IRON) 160 (50 Fe) MG TBCR SR tablet, Take 1 tablet (160 mg total) by mouth daily., Disp: 30 tablet, Rfl: 3   fluticasone (CUTIVATE) 0.05 % cream, Apply topically 2 (two) times daily. (Patient taking differently: Apply 1 Application topically 2 (two) times daily.), Disp: 15 g, Rfl: 0   hydrochlorothiazide (HYDRODIURIL) 25 MG tablet, Take 1 tablet (25 mg  total) by mouth daily., Disp: 30 tablet, Rfl: 0   hydrocortisone butyrate (LOCOID) 0.1 % CREA cream, Apply 1 Application topically 2 (two) times daily., Disp: 45 g, Rfl: 2   losartan (COZAAR) 25 MG tablet, Take 1 tablet (25 mg total) by mouth daily., Disp: 90 tablet, Rfl: 3   Norgestimate-Ethinyl Estradiol Triphasic (TRI-LO-SPRINTEC) 0.18/0.215/0.25 MG-25 MCG tab, Take 1 tablet by mouth daily., Disp: 28 tablet, Rfl: 3   Norgestimate-Ethinyl Estradiol Triphasic (TRI-LO-SPRINTEC) 0.18/0.215/0.25 MG-25 MCG tab, TAKE 1 TABLET BY MOUTH DAILY, Disp: 28 tablet, Rfl: 1   omeprazole (PRILOSEC) 40 MG capsule, TAKE 1 CAPSULE(40 MG) BY MOUTH DAILY (Patient taking differently: Take 40 mg by mouth daily. TAKE 1 CAPSULE(40 MG) BY MOUTH DAILY), Disp: 30 capsule, Rfl: 1  predniSONE (DELTASONE) 10 MG tablet, Take 10 mg by mouth daily with breakfast., Disp: , Rfl:    predniSONE (DELTASONE) 10 MG tablet, Take 1 tablet (10 mg total) by mouth daily with breakfast., Disp: 30 tablet, Rfl: 3   predniSONE (DELTASONE) 5 MG tablet, Take 1.5 tablets (7.5 mg total) by mouth daily with breakfast., Disp: 90 tablet, Rfl: 1   amLODipine (NORVASC) 5 MG tablet, Take 1 tablet (5 mg total) by mouth daily., Disp: 30 tablet, Rfl: 5  EXAM:  VITALS per patient if applicable:  GENERAL: alert, oriented, appears well and in no acute distress  HEENT: atraumatic, conjunttiva clear, no obvious abnormalities on inspection of external nose and ears  NECK: normal movements of the head and neck  LUNGS: on inspection no signs of respiratory distress, breathing rate appears normal, no obvious gross SOB, gasping or wheezing  CV: no obvious cyanosis  MS: moves all visible extremities without noticeable abnormality  PSYCH/NEURO: pleasant and cooperative, no obvious depression or anxiety, speech and thought processing grossly intact  ASSESSMENT AND PLAN:  Discussed the following assessment and plan:  #1 hypertension poorly controlled.   Patient was recently placed on losartan 25 mg daily and taking consistently.  However, she stopped her amlodipine which might have been an oversight. -Recommend she focus on low-sodium diet and try to step up her exercise -Start back amlodipine 5 mg daily in addition to losartan and monitor closely over the next few weeks.  Be in touch with either [place] or cardiology if not consistent readings below 130/80 after a few weeks on above. -May need higher dose of losartan and and or addition of thiazide  #2 skin rash.  May have some granulomatous features based on her picture but also appears to have some dry scaly rash on her anterior chest.  She has done well with Locoid cream in the past.  Sent in prescription for Locoid cream to use twice daily as needed.  Apply especially after bathing.  Avoid prolonged bathing or excessive hot water.  She is looking at potential systemic treatment for her sarcoidosis beyond oral steroids and this may improve her rash     I discussed the assessment and treatment plan with the patient. The patient was provided an opportunity to ask questions and all were answered. The patient agreed with the plan and demonstrated an understanding of the instructions.   The patient was advised to call back or seek an in-person evaluation if the symptoms worsen or if the condition fails to improve as anticipated.     Evelena Peat, MD

## 2023-02-21 NOTE — Progress Notes (Signed)
Referral placed to [employer] for [employer] (biosimilar) infusions.   Dose for infliximab: 3mg /kg at Week 0, 2, and 6, then 3mg /kg every 8 weeks thereafter   Premedications: diphenhydramine 25mg , acetaminophen 650mg  given 30-45 minutes before infusion, Solu-Medrol 40 mg (she will need to hold prednisone the day of infusion)   Chesley Mires, PharmD, MPH, BCPS, CPP Clinical Pharmacist (Rheumatology and Pulmonology)

## 2023-02-23 ENCOUNTER — Other Ambulatory Visit: Payer: Self-pay | Admitting: Family Medicine

## 2023-02-24 ENCOUNTER — Telehealth: Payer: Self-pay | Admitting: Pharmacist

## 2023-02-24 ENCOUNTER — Encounter: Payer: Self-pay | Admitting: Family Medicine

## 2023-02-24 ENCOUNTER — Other Ambulatory Visit: Payer: Self-pay | Admitting: Family Medicine

## 2023-02-24 NOTE — Telephone Encounter (Signed)
ATC pt to discuss infliximab. Unable to reach and VM box is full. MyChart message sent to pt

## 2023-02-25 ENCOUNTER — Encounter: Payer: Self-pay | Admitting: Internal Medicine

## 2023-02-25 ENCOUNTER — Other Ambulatory Visit: Payer: Self-pay | Admitting: Family

## 2023-02-25 MED ORDER — HYDROCHLOROTHIAZIDE 25 MG PO TABS: 25.0000 mg | ORAL_TABLET | Freq: Every day | ORAL | 3 refills | Status: DC

## 2023-02-25 NOTE — Telephone Encounter (Signed)
Rx done. 

## 2023-02-26 NOTE — Telephone Encounter (Signed)
ATC patient to discuss infliximab. Unable to reach and VM box remains full. Will f/u

## 2023-03-03 NOTE — Telephone Encounter (Signed)
ATC #3  to discuss infliximab infusions. Unable to reach. Unable to leave VM bc VM box is full. Letter sent today. If no response by 03/13/23, will close further f/u  Infliximab has already been approved through her insurance but does not appear to have first infusion scheduled anyways.  Chesley Mires, PharmD, MPH, BCPS, CPP Clinical Pharmacist (Rheumatology and Pulmonology)

## 2023-03-04 ENCOUNTER — Encounter: Payer: Self-pay | Admitting: Internal Medicine

## 2023-03-07 ENCOUNTER — Telehealth: Payer: Self-pay

## 2023-03-07 NOTE — Telephone Encounter (Signed)
Patient notified through my chart.

## 2023-03-07 NOTE — Telephone Encounter (Signed)
-----   Message from Georgeanna Lea, MD sent at 03/06/2023 11:38 AM EDT ----- Monitor was normal

## 2023-03-13 NOTE — Telephone Encounter (Signed)
Closing encounter since there has been no f/u from patient. Routing to MR as Katheren Shams, PharmD, MPH, BCPS, CPP Clinical Pharmacist (Rheumatology and Pulmonology)

## 2023-03-19 ENCOUNTER — Encounter: Payer: Self-pay | Admitting: Family Medicine

## 2023-03-19 MED ORDER — BENZONATATE 100 MG PO CAPS: 100.0000 mg | ORAL_CAPSULE | Freq: Three times a day (TID) | ORAL | 1 refills | Status: DC | PRN

## 2023-03-19 MED ORDER — CYCLOBENZAPRINE HCL 5 MG PO TABS: ORAL_TABLET | ORAL | 0 refills | Status: DC

## 2023-03-31 ENCOUNTER — Telehealth: Payer: Self-pay | Admitting: Internal Medicine

## 2023-03-31 NOTE — Telephone Encounter (Signed)
Rx team got a letterf fom [employer] 02/24/23 sayin Avsola will be coverd at out of network level

## 2023-04-01 NOTE — Telephone Encounter (Signed)
Patient has not returned any of my calls or MyChart messages to discuss infusions. She was approved but was never initiated because of lack of follow-up from pt.  Chesley Mires, PharmD, MPH, BCPS, CPP Clinical Pharmacist (Rheumatology and Pulmonology)

## 2023-04-14 NOTE — Telephone Encounter (Signed)
THanks. I Will address  at office visit in Aug 2024. Closing encounter.

## 2023-04-15 ENCOUNTER — Other Ambulatory Visit: Payer: Self-pay | Admitting: Family Medicine

## 2023-04-15 DIAGNOSIS — Z3009 Encounter for other general counseling and advice on contraception: Secondary | ICD-10-CM

## 2023-04-17 ENCOUNTER — Encounter: Payer: Self-pay | Admitting: Family Medicine

## 2023-04-17 NOTE — Telephone Encounter (Signed)
Pt call you back and stated you can leave her a message on mychart because she is at work and can't answer the phone.

## 2023-04-17 NOTE — Telephone Encounter (Signed)
Left a message for the patient to return my call.  

## 2023-04-23 ENCOUNTER — Ambulatory Visit: Payer: Commercial Managed Care - HMO | Admitting: Family Medicine

## 2023-04-23 ENCOUNTER — Encounter: Payer: Self-pay | Admitting: Family Medicine

## 2023-04-23 VITALS — BP 120/78 | Temp 98.0°F | Wt 279.5 lb

## 2023-04-23 DIAGNOSIS — Z201 Contact with and (suspected) exposure to tuberculosis: Secondary | ICD-10-CM

## 2023-04-23 NOTE — Patient Instructions (Signed)
Set up repeat mammogram  

## 2023-04-23 NOTE — Progress Notes (Signed)
Established Patient Office Visit  Subjective   Patient ID: Ashley Henry, female    DOB: 1976-04-05  Age: 47 y.o. MRN: [id number]  Chief Complaint  Patient presents with   TB exposure    HPI   Ashley Henry has history of sarcoidosis with chronic baseline cough.  She is seen today with possible recent exposure to TB.Marland Kitchen  She states her boyfriend had positive QuantiFERON gold plus assay.  He apparently works with people who have moved here from other countries.  He has no active symptoms such as active cough, fever, night sweats, weight loss, hemoptysis, etc.  Ashley Henry has chronic cough related to her sarcoidosis but unchanged.  She denies any symptoms as above.  She is requesting testing.  She had chest x-ray last January which showed no active disease.  She had chronic changes consistent with her sarcoidosis.  Past Medical History:  Diagnosis Date   Abnormal CT scan of lung 07/13/2020   Abnormal finding on imaging of liver 07/13/2020   Anemia    BACK PAIN, UPPER 07/27/2010   Qualifier: Diagnosis of  By: Caryl Never MD,      BRONCHITIS, ACUTE 07/16/2010   Qualifier: Diagnosis of  By: Caryl Never MD, Carlena Hurl, GREAT TOE 01/08/2010   Qualifier: Diagnosis of  By: Caryl Never MD,      Enlarged uterus 05/13/2011   Essential hypertension 09/07/2009   Qualifier: Diagnosis of  By: Yetta Barre MD, Bernadene Bell.    HEADACHE 08/03/2010   Qualifier: Diagnosis of  By: Caryl Never MD,      HYPERLIPIDEMIA 10/11/2009   Hypertension 2010   no meds now   INGROWN NAIL 02/08/2010   Qualifier: Diagnosis of  By: Caryl Never MD,      Iron deficiency anemia due to chronic blood loss 06/20/2014   Kidney stone 10/30/2015   OBESITY 10/11/2009   Qualifier: Diagnosis of  By: Caryl Never MD,      PARESTHESIA 08/03/2010   Qualifier: Diagnosis of  By: Caryl Never MD,      Pre-diabetes    HgbA1c 6.2 on 09-30-17   Renal disorder    SINUSITIS, ACUTE 12/07/2009   Qualifier: Diagnosis of  By: Caryl Never MD,       SORE THROAT 02/14/2010   Qualifier: Diagnosis of  By: Gabriel Rung LPN, Sherron Ales D deficiency 07/13/2020   Past Surgical History:  Procedure Laterality Date   BREAST REDUCTION SURGERY  1999   BUBBLE STUDY  05/16/2022   Procedure: BUBBLE STUDY;  Surgeon: Pricilla Riffle, MD;  Location: [facility] ENDOSCOPY;  Service: Cardiovascular;;   CESAREAN SECTION  2004   Cysts removed from chest wall     EYE SURGERY Left    MASS EXCISION N/A 04/14/2018   Procedure: EXCISION OF CHEST WALL MASS;  Surgeon: Abigail Miyamoto, MD;  Location: [facility];  Service: General;  Laterality: N/A;   TEE WITHOUT CARDIOVERSION N/A 05/16/2022   Procedure: TRANSESOPHAGEAL ECHOCARDIOGRAM (TEE);  Surgeon: Pricilla Riffle, MD;  Location: [facility] ENDOSCOPY;  Service: Cardiovascular;  Laterality: N/A;    reports that she has never smoked. She has never used smokeless tobacco. She reports that she does not drink alcohol and does not use drugs. family history includes Diabetes in her mother; Heart disease in her maternal uncle; Hypertension in her father and mother; Lung cancer in her maternal grandfather; Prostate cancer in her maternal uncle; Stomach cancer in her maternal uncle and paternal grandmother; Thyroid disease in her maternal uncle. Allergies  Allergen Reactions  Multihance [Gadobenate] Nausea And Vomiting   Collagenase Hives   Collagenase Clostridium Histolyticum Hives   Aspirin Rash    Review of Systems  Constitutional:  Negative for chills and fever.  Respiratory:  Positive for cough. Negative for hemoptysis, shortness of breath and wheezing.   Cardiovascular:  Negative for chest pain.      Objective:     BP 120/78 (BP Location: Left Arm, Patient Position: Sitting, Cuff Size: Large)   Temp 98 F (36.7 C) (Axillary)   Wt 279 lb 8 oz (126.8 kg)   BMI 47.98 kg/m  BP Readings from Last 3 Encounters:  04/23/23 120/78  02/20/23 (!) 132/90  01/20/23 (!) 168/90   Wt Readings from Last 3  Encounters:  04/23/23 279 lb 8 oz (126.8 kg)  02/21/23 276 lb 6.4 oz (125.4 kg)  02/20/23 276 lb 6.4 oz (125.4 kg)      Physical Exam Vitals reviewed.  Constitutional:      Appearance: Normal appearance.  Cardiovascular:     Rate and Rhythm: Normal rate and regular rhythm.  Pulmonary:     Effort: Pulmonary effort is normal.     Breath sounds: Normal breath sounds. No wheezing or rales.  Neurological:     Mental Status: She is alert.      No results found for any visits on 04/23/23.    The ASCVD Risk score (Arnett DK, et al., 2019) failed to calculate for the following reasons:   Cannot find a previous HDL lab   Cannot find a previous total cholesterol lab    Assessment & Plan:   Problem List Items Addressed This Visit   None Visit Diagnoses     Exposure to TB    -  Primary   Relevant Orders   QuantiFERON-TB Gold Plus     Patient is seen today with exposure to boyfriend who had positive QuantiFERON gold assay.  He has no active symptoms.  They do not live together but she is around him several hours per week.  She states her boyfriend had chest x-ray which is pending and they are waiting to make decisions regarding his treatment  -Check QuantiFERON gold plus assay. -Follow-up immediately for any fever, hemoptysis, night sweats, dyspnea, etc. -She will be in touch once they arrive at disposition regarding his status  No follow-ups on file.    Evelena Peat, MD

## 2023-04-27 ENCOUNTER — Encounter: Payer: Self-pay | Admitting: Family Medicine

## 2023-04-27 ENCOUNTER — Encounter: Payer: Self-pay | Admitting: Internal Medicine

## 2023-04-28 NOTE — Telephone Encounter (Signed)
If is blood/skin test positive fo TB that is called "latent tB" and is not active TB. You will NOT get it from thtat person or activate sarcoid. Only way you can get TB is that if that person has active pulmonary TB which does not seem the case based on your description of clear CXR  Thanks

## 2023-04-29 ENCOUNTER — Other Ambulatory Visit: Payer: Self-pay | Admitting: Family Medicine

## 2023-04-29 ENCOUNTER — Other Ambulatory Visit: Payer: Self-pay | Admitting: Internal Medicine

## 2023-04-29 ENCOUNTER — Ambulatory Visit: Payer: Commercial Managed Care - HMO | Attending: Cardiology | Admitting: Cardiology

## 2023-04-29 ENCOUNTER — Encounter: Payer: Self-pay | Admitting: Family Medicine

## 2023-04-29 ENCOUNTER — Encounter: Payer: Self-pay | Admitting: Cardiology

## 2023-04-29 VITALS — BP 136/78 | HR 105 | Ht 64.0 in | Wt 274.0 lb

## 2023-04-29 DIAGNOSIS — R0609 Other forms of dyspnea: Secondary | ICD-10-CM

## 2023-04-29 DIAGNOSIS — N289 Disorder of kidney and ureter, unspecified: Secondary | ICD-10-CM

## 2023-04-29 DIAGNOSIS — I340 Nonrheumatic mitral (valve) insufficiency: Secondary | ICD-10-CM | POA: Diagnosis not present

## 2023-04-29 DIAGNOSIS — D869 Sarcoidosis, unspecified: Secondary | ICD-10-CM

## 2023-04-29 DIAGNOSIS — I10 Essential (primary) hypertension: Secondary | ICD-10-CM | POA: Diagnosis not present

## 2023-04-29 DIAGNOSIS — R7303 Prediabetes: Secondary | ICD-10-CM

## 2023-04-29 NOTE — Progress Notes (Unsigned)
Cardiology Office Note:    Date:  04/29/2023   ID:  Ashley Henry, DOB 07-24-1976, MRN [id number]  PCP:  Kristian Covey, MD  Cardiologist:  Gypsy Balsam, MD    Referring MD: Kristian Covey, MD   Chief Complaint  Patient presents with   Follow-up    History of Present Illness:    Ashley Henry is a 47 y.o. female with past medical history significant for sarcoid, cardiac wise she did have transesophageal echocardiogram showed mild mitral regurgitation previously described as severe, she is obese, presented to [place] because of shortness of breath and cough.  She did have pulmonary function test done apparently was abnormal and all her shortness of breath being blamed on her sarcoid.  From cardiac standpoint review denies having any tightness squeezing pressure burning chest.  She did have some palpitations, that resulted in the monitor which shows sinus tachycardia multiple triggered events total of 38 every single time triggered event showed normal sinus rhythm or other sinus tachycardia.  Denies have any new complaints.  Cough is still there or shortness of breath is still there.  Past Medical History:  Diagnosis Date   Abnormal CT scan of lung 07/13/2020   Abnormal finding on imaging of liver 07/13/2020   Anemia    BACK PAIN, UPPER 07/27/2010   Qualifier: Diagnosis of  By: Caryl Never MD, Bruce     BRONCHITIS, ACUTE 07/16/2010   Qualifier: Diagnosis of  By: Caryl Never MD, Carlena Hurl, GREAT TOE 01/08/2010   Qualifier: Diagnosis of  By: Caryl Never MD, Bruce     Enlarged uterus 05/13/2011   Essential hypertension 09/07/2009   Qualifier: Diagnosis of  By: Yetta Barre MD, Bernadene Bell.    HEADACHE 08/03/2010   Qualifier: Diagnosis of  By: Caryl Never MD, Bruce     HYPERLIPIDEMIA 10/11/2009   Hypertension 2010   no meds now   INGROWN NAIL 02/08/2010   Qualifier: Diagnosis of  By: Caryl Never MD, Bruce     Iron deficiency anemia due to chronic blood loss 06/20/2014   Kidney stone  10/30/2015   OBESITY 10/11/2009   Qualifier: Diagnosis of  By: Caryl Never MD, Bruce     PARESTHESIA 08/03/2010   Qualifier: Diagnosis of  By: Caryl Never MD, Bruce     Pre-diabetes    HgbA1c 6.2 on 09-30-17   Renal disorder    SINUSITIS, ACUTE 12/07/2009   Qualifier: Diagnosis of  By: Caryl Never MD, Bruce     SORE THROAT 02/14/2010   Qualifier: Diagnosis of  By: Gabriel Rung LPN, Sherron Ales D deficiency 07/13/2020    Past Surgical History:  Procedure Laterality Date   BREAST REDUCTION SURGERY  1999   BUBBLE STUDY  05/16/2022   Procedure: BUBBLE STUDY;  Surgeon: Pricilla Riffle, MD;  Location: [facility] ENDOSCOPY;  Service: Cardiovascular;;   CESAREAN SECTION  2004   Cysts removed from chest wall     EYE SURGERY Left    MASS EXCISION N/A 04/14/2018   Procedure: EXCISION OF CHEST WALL MASS;  Surgeon: Abigail Miyamoto, MD;  Location: [facility];  Service: General;  Laterality: N/A;   TEE WITHOUT CARDIOVERSION N/A 05/16/2022   Procedure: TRANSESOPHAGEAL ECHOCARDIOGRAM (TEE);  Surgeon: Pricilla Riffle, MD;  Location: [facility] ENDOSCOPY;  Service: Cardiovascular;  Laterality: N/A;    Current Medications: Current Meds  Medication Sig   albuterol (PROVENTIL) (2.5 MG/3ML) 0.083% nebulizer solution Take 3 mLs (2.5 mg total) by nebulization every 6 (six) hours as  needed for wheezing or shortness of breath.   albuterol (VENTOLIN HFA) 108 (90 Base) MCG/ACT inhaler INHALE 2 PUFFS INTO THE LUNGS EVERY 6 HOURS AS NEEDED FOR WHEEZING OR SHORTNESS OF BREATH (Patient taking differently: Inhale 2 puffs into the lungs every 6 (six) hours as needed for wheezing or shortness of breath.)   amLODipine (NORVASC) 5 MG tablet Take 1 tablet (5 mg total) by mouth daily.   benzonatate (TESSALON) 100 MG capsule Take 1 capsule (100 mg total) by mouth 3 (three) times daily as needed for cough. TAKE 1 CAPSULE(100 MG) BY MOUTH THREE TIMES DAILY AS NEEDED FOR COUGH   BEPREVE 1.5 % SOLN Place 1 drop into both eyes daily.    cyclobenzaprine (FLEXERIL) 5 MG tablet TAKE 1 TABLET BY MOUTH EVERY 8 HOURS AS NEEDED FOR MUSCLE SPASM (Patient taking differently: Take 5 mg by mouth 3 (three) times daily as needed for muscle spasms. Take 1 tablet by mouth every 8 hours as needed for muscle spasm)   ferrous sulfate (SLOW IRON) 160 (50 Fe) MG TBCR SR tablet Take 1 tablet (160 mg total) by mouth daily.   fluticasone (CUTIVATE) 0.05 % cream Apply topically 2 (two) times daily. (Patient taking differently: Apply 1 Application topically 2 (two) times daily.)   hydrochlorothiazide (HYDRODIURIL) 25 MG tablet Take 1 tablet (25 mg total) by mouth daily.   hydrocortisone butyrate (LOCOID) 0.1 % CREA cream Apply 1 Application topically 2 (two) times daily.   losartan (COZAAR) 25 MG tablet Take 1 tablet (25 mg total) by mouth daily.   Norgestimate-Ethinyl Estradiol Triphasic (TRI-LO-SPRINTEC) 0.18/0.215/0.25 MG-25 MCG tab Take 1 tablet by mouth daily.   omeprazole (PRILOSEC) 40 MG capsule TAKE 1 CAPSULE(40 MG) BY MOUTH DAILY (Patient taking differently: Take 40 mg by mouth daily. TAKE 1 CAPSULE(40 MG) BY MOUTH DAILY)   predniSONE (DELTASONE) 10 MG tablet Take 10 mg by mouth daily with breakfast.   predniSONE (DELTASONE) 10 MG tablet Take 1 tablet (10 mg total) by mouth daily with breakfast.   predniSONE (DELTASONE) 5 MG tablet Take 1.5 tablets (7.5 mg total) by mouth daily with breakfast.   TRI-LO-MILI 0.18/0.215/0.25 MG-25 MCG tab TAKE 1 TABLET BY MOUTH DAILY     Allergies:   Multihance [gadobenate], Collagenase, Collagenase clostridium histolyticum, and Aspirin   Social History   Socioeconomic History   Marital status: Married    Spouse name: [name]   Number of children: [name]   Years of education: [name]   Highest education level: [name]  Occupational History   [name]  Tobacco Use   Smoking status: Never   Smokeless tobacco: Never  Vaping Use   Vaping status: Never Used  Substance and Sexual Activity    Alcohol use: No    Alcohol/week: 0.0 standard drinks of alcohol   Drug use: No   Sexual activity: Yes    Birth control/protection: Pill, Condom    Comment: 1st intercourse 47 yo-Fewer than 5 partners  Other Topics Concern   [name]  Social History Narrative   [name]   Social Determinants of Health   Financial Resource Strain: [name]  Food Insecurity: [name]  Transportation Needs: [name]  Physical Activity: [name]  Stress: [name]  Social Connections: [name]     Family History: The patient's family history includes Diabetes in her mother; Heart disease in her maternal uncle; Hypertension in her father  and mother; Lung cancer in her maternal grandfather; Prostate cancer in her maternal uncle; Stomach cancer in her maternal uncle and paternal grandmother; Thyroid disease in her maternal uncle. ROS:   Please see the history of present illness.    All 14 point review of systems negative except as described per history of present illness  EKGs/Labs/Other Studies Reviewed:    EKG Interpretation Date/Time:  Tuesday April 29 2023 08:30:28 EDT Ventricular Rate:  107 PR Interval:  162 QRS Duration:  98 QT Interval:  338 QTC Calculation: 451 R Axis:   -33  Text Interpretation: Sinus tachycardia Left axis deviation Anterolateral infarct , age undetermined No previous ECGs available Confirmed by Gypsy Balsam [phone]) on 04/29/2023 8:34:35 AM    Recent Labs: 09/24/2022: ALT 17; Platelets 443.0; Pro B Natriuretic peptide (BNP) 86.0 01/20/2023: BUN 20; Creatinine, Ser 1.33; Hemoglobin 9.9; Potassium 4.7; Sodium 138  Recent Lipid Panel    Component Value Date/Time   CHOL 203 (H) 09/29/2017 1420   TRIG 101 09/29/2017 1420   HDL 46 (L) 09/29/2017 1420   CHOLHDL 4.4 09/29/2017 1420   VLDL 16 06/17/2016 1243   LDLCALC 137 (H) 09/29/2017 1420    Physical Exam:    VS:  BP 136/78 (BP Location: Left Arm, Patient Position: Sitting)   Pulse (!)  105   Ht 5\' 4"  (1.626 m)   Wt 274 lb (124.3 kg)   SpO2 95%   BMI 47.03 kg/m     Wt Readings from Last 3 Encounters:  04/29/23 274 lb (124.3 kg)  04/23/23 279 lb 8 oz (126.8 kg)  02/21/23 276 lb 6.4 oz (125.4 kg)     GEN:  Well nourished, well developed in no acute distress HEENT: Normal NECK: No JVD; No carotid bruits LYMPHATICS: No lymphadenopathy CARDIAC: RRR, no murmurs, no rubs, no gallops RESPIRATORY:  Clear to auscultation without rales, wheezing or rhonchi  ABDOMEN: Soft, non-tender, non-distended MUSCULOSKELETAL:  No edema; No deformity  SKIN: Warm and dry LOWER EXTREMITIES: no swelling NEUROLOGIC:  Alert and oriented x 3 PSYCHIATRIC:  Normal affect   ASSESSMENT:    1. Dyspnea on exertion   2. Essential hypertension   3. Nonrheumatic mitral valve regurgitation   4. Sarcoid   5. Pre-diabetes    PLAN:    In order of problems listed above:  Dyspnea on exertion probably related to sarcoid, just to be sure that this is not cardiac related I will ask her to have proBNP done.  Echocardiogram showed relaxation abnormality which indicated normal pulm artery pressure at the time of the study, her degree of mitral regurgitation is not sufficient to justify her shortness of breath is only mild to moderate.  proBNP will help determine if we need to augment her medical therapy. Sarcoidosis no evidence of heart involvement so far.  We are trying to do MRI however she is allergic to IV MRI dye, therefore we will try to schedule her to have a PET scan of the heart. Nonrheumatic mitral valve regurgitation mild to moderate.  Blood pressure well-controlled.  Will check proBNP to see if there is any role of maintaining of her diuretics. Prediabetes.  Noted.   Medication Adjustments/Labs and Tests Ordered: Current medicines are reviewed at length with the patient today.  Concerns regarding medicines are outlined above.  Orders Placed This Encounter  Procedures   EKG 12-Lead    Medication changes: No orders of the defined types were placed in this encounter.   Signed, Georgeanna Lea, MD, [facility]  04/29/2023 8:47 AM    [facility]

## 2023-04-29 NOTE — Patient Instructions (Addendum)
Medication Instructions:  Your physician recommends that you continue on your current medications as directed. Please refer to the Current Medication list given to you today.  *If you need a refill on your cardiac medications before your next appointment, please call your pharmacy*   Lab Work: 3rd Floor [address] ProBNP, BMP- today If you have labs (blood work) drawn today and your tests are completely normal, you will receive your results only by: MyChart Message (if you have MyChart) OR A paper copy in the mail If you have any lab test that is abnormal or we need to change your treatment, we will call you to review the results.   Testing/Procedures: Please report to Radiology at the [facility] Main Entrance 15 minutes early for your test.  [address] [place], [place] [address]  BRING FOOD DIARY WITH YOU TO THIS APPOINTMENT  For 24 hours before the test: Do not exercise! Do not eat after 5 pm the day before your test!  To make sure that your scanning results are accurate, you MUST follow the sarcoid prep meal diet starting the day before your PET scan. This diet involves eating no carbohydrates 24 hours before the test.   You will keep a log of all that you eat the day before your test. If you have questions or do not understand this diet, please call [phone] for more information. If you are unable to follow this diet, please discuss an alternative strategy with the coordinator.   If you are diabetic, continue your diabetes medications as usual on the day before until you begin to fast. NO DIABETES MEDICATIONS ONCE YOU BEGIN TO FAST. What foods can I eat the day before my test?  Drink only water or black coffee (WITHOUT sugar, artificial sweetener, cream, or milk). Eggs (prepared without milk or cheese)  Meat that is either broiled or pan fried in butter WITHOUT breading (chicken, [place], bacon, meat-only sausage, hamburger, steak, fish) Butter, salt &  pepper What foods must I AVOID the day before my test?  Do not consume alcoholic beverages, sodas, fruit juice, coffee creamer, or sports drinks  Do not eat vegetables, beans, nuts, fruits, juices, bread, grains, rice, pasta, potatoes, or any baked goods Do not eat dairy products (milk, cheese, etc.)  Do not eat mayonnaise, ketchup, tartar sauce, mustard, or other condiments Do not add sugar, artificial sweeteners, or Splenda (sucralose) to foods or drinks  Do not eat breaded foods (like fried chicken)  Do not eat sweets, candy, gum, sweetened cough drops, lozenges, or sugar  Do not eat sweetened, grilled, or cured meats or meat with carbohydrate-containing additives (some sausages, ham, sweetened bacon) Suggested items for breakfast, lunch, or dinner:  Breakfast  3 to 5 fatty sausage links fried in butter. 3 to 5 bacon strips.  3 eggs pan fried in butter (no milk or cheese).  Lunch/Dinner  2 hamburger patties fried in butter. Chicken or fatty fish pan fried in butter. No breading. 8 oz. fatty steak pan fried in butter.  Beverages  Drink only water or black coffee. DO NOT ADD SUGAR, ARTIFICIAL SWEETENER, CREAM, OR MILK   For more information and frequently asked questions, please visit our website : [email or web address]     Cardiac Sarcoidosis/Inflammation PET Scan  Food Diary Name: _____________________________ Please fill in EXACTLY what you have eaten and when for 24 hours PRIOR to your test date.  Time Food/Drink Comments  Breakfast  Lunch                Dinner                Snacks                 DO NOT EXERCISE THE DAY BEFORE YOUR TEST DO NOT EAT AFTER 5 PM THE DAY BEFORE YOUR TEST.  ON THE DAY OF YOUR TEST, DO NOT EAT ANY FOOD AND ONLY DRINK CLEAR WATER! PLEASE BRING THIS FOOD DIARY WITH YOU TO YOUR APPOINTMENT    Follow-Up: At [facility], you and your health needs are our priority.  As part of our continuing mission to  provide you with exceptional heart care, we have created designated Provider Care Teams.  These Care Teams include your primary Cardiologist (physician) and Advanced Practice Providers (APPs -  Physician Assistants and Nurse Practitioners) who all work together to provide you with the care you need, when you need it.  We recommend signing up for the patient portal called "MyChart".  Sign up information is provided on this After Visit Summary.  MyChart is used to connect with patients for Virtual Visits (Telemedicine).  Patients are able to view lab/test results, encounter notes, upcoming appointments, etc.  Non-urgent messages can be sent to your provider as well.   To learn more about what you can do with MyChart, go to [email or web address].    Your next appointment:   5 month(s)  The format for your next appointment:   In Person  Provider:   Gypsy Balsam, MD    Other Instructions NA

## 2023-04-30 MED ORDER — BENZONATATE 100 MG PO CAPS: ORAL_CAPSULE | ORAL | 2 refills | Status: DC

## 2023-05-01 ENCOUNTER — Telehealth: Payer: Self-pay

## 2023-05-01 NOTE — Telephone Encounter (Signed)
Left message on My Chart with normal Lab results per Dr. Vanetta Shawl note. Routed to PCP.

## 2023-05-01 NOTE — Addendum Note (Signed)
Addended by: Johnella Moloney on: 05/01/2023 04:04 PM   Modules accepted: Orders

## 2023-05-16 ENCOUNTER — Encounter: Payer: Self-pay | Admitting: Internal Medicine

## 2023-05-16 ENCOUNTER — Ambulatory Visit: Payer: Commercial Managed Care - HMO | Admitting: Internal Medicine

## 2023-05-16 VITALS — BP 140/80 | HR 106 | Ht 64.0 in

## 2023-05-16 DIAGNOSIS — D869 Sarcoidosis, unspecified: Secondary | ICD-10-CM

## 2023-05-16 DIAGNOSIS — L819 Disorder of pigmentation, unspecified: Secondary | ICD-10-CM

## 2023-05-16 DIAGNOSIS — R0609 Other forms of dyspnea: Secondary | ICD-10-CM | POA: Diagnosis not present

## 2023-05-16 DIAGNOSIS — R053 Chronic cough: Secondary | ICD-10-CM

## 2023-05-16 NOTE — Progress Notes (Signed)
OV 06/19/2020  Subjective:  Patient ID: Ashley Henry, female , DOB: 1976-05-01 , age 47 y.o. , MRN: [id number] , ADDRESS: [address] [place] [address]  PCP Kristian Covey, MD Attending : Treatment Team:  Attending Provider: Kalman Shan, MD    06/19/2020 -   Chief Complaint  Patient presents with   Consult    Patient is here for a productive/dry cough that she has had for several months with brown/yellow thivk sputum. Shortness of breath with exertion, stong smells, to hot.      HPI Ashley Henry 47 y.o. -referred by primary care physician Burchette, Elberta Fortis, MD.  Patient is a psychotherapist.  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  Her 3 maternal uncles, her brother and her maternal aunt all have sarcoidosis.  Her uncles are deceased although from other reasons.  Her brother and her aunt are still living.  She tells me that in the last 6 months he has had insidious onset of chronic cough, shortness of breath, night sweats and chills.  These have persisted.  This is despite having an intentional 8 pound weight loss during this time.  She says cough is made worse by talking or exertion.  Similarly shortness of breath is made worse by exertion.  Also made worse by talking through the mask during counseling sessions.  Both are relieved by rest.  Cough is also relieved by not talking.  She does have nocturnal awakening with cough chest tightness and wheezing.  She also reports right infra axillary right upper quadrant pain with coughing.  She does feel chest tightness deep inside the chest.  She had CT scan of the chest that I personally visualized.  Shows mediastinal lymphadenopathy consistent with stage I sarcoidosis.  CT scan abdomen cuts on the chest CT show possible evidence of sarcoidosis in the liver.  MRI has been recommended.  There are associated palpitations and night sweats.  Cough is associated with sputum productin     IMPRESSION:  1.  Pulmonary parenchymal and thoracic nodal findings which are consistent with sarcoidosis. 2. Heterogeneous density throughout the liver. Possibly heterogeneous steatosis. Hepatic involvement of sarcoidosis could look similar. If there are right upper quadrant symptoms or abnormal liver function test, pre and post contrast abdominal MRI should be considered.     Electronically Signed   By: Jeronimo Greaves M.D.   On: 05/29/2020 10:59  ROS - per HPI  Results for Ashley Henry, Ashley Henry (MRN [id number]) as of 06/19/2020 09:03  Ref. Range 09/26/2017 14:27 10/13/2017 16:54 10/06/2018 14:47  Anti Nuclear Antibody (ANA) Latest Ref Range: NEGATIVE   NEGATIVE   SSA (Ro) (ENA) Antibody, IgG Latest Ref Range: <1.0 NEG AI <1.0 NEG    SSB (La) (ENA) Antibody, IgG Latest Ref Range: <1.0 NEG AI <1.0 NEG       07/13/2020 Follow up : Cough/? Sarcoid /Abnormal CT chest and MRI AB   47 year old female never smoker seen for pulmonary consult June 19, 2020 for 46-month history of chronic cough shortness of breath night sweats and abnormal CT chest suspicious for sarcoidosis.  Patient has a very strong family history of sarcoidosis.  Patient presents for a 3-week follow-up.  Patient was seen last visit for pulmonary consult for a 28-month history of cough shortness of breath night sweats and chills.  She was seen by her primary care provider.  Subsequent CT chest May 29, 2020 showed pulmonary nodularity, possible bilateral bilateral hilar adenopathy,  liver abnormalities.  This was suspicious for possible sarcoidosis.  Lab work last visit showed an elevated ACE level at 82 and a high sed rate at greater than 130.  Her rheumatoid factor was positive but CCP was negative.  INR was slightly elevated at 1.1.  Patient anemia was stable with hemoglobin at 9.3.  Her platelets were normal.  Hepatic function testing was normal.  Patient was set up for an MRI abdomen that showed abnormalities with a hyperenhancing area in the medial  segment of the left hepatic lobe hepatic nodularity.  And splenic lesions.  There was suspicion for portal hypertension with possible varices versus a distal esophageal hyperenhancement.  There was no signs of steatosis. Patient has significant fatigue low energy decreased activity tolerance cough joint pains.  She has had intermittent rashes but has none currently.  She has had a recent eye exam with no reported abnormalities. We discussed all of her test and lab results. Has very strong family history of sarcoidosis with 3 maternal uncles, her brother and a maternal aunt that all have sarcoidosis. Patient has had intentional weight loss says that she has been on a diet and is down greater than 89 pounds. She denies any hemoptysis bloody stools hematemesis, bruising.  We had a long discussion regarding her abnormalities in her lab work and recent MRI of her liver and spleen.  We discussed the need to proceed with probable bronchoscopy with biopsy and referral to GI for consideration for further work-up of liver involvement and abnormalities. Unfortunately patient's medical insurance is going to stop July 16, 2020.  She declines flu shot and Covid vaccines  Patient works independently as a Airline pilot.    TEST/EVENTS :  Chest x-ray September 29, 2017 clear lungs no acute process  Renal CT April 2017-no focal abnormalities noted in the liver, spleen or pancreas, nonobstructive left nephrolithiasis, moderate right hydroureter nephrosis  CT chest without contrast May 29, 2020 showed perilymphatic, peribronchovascular and subpleural distribution of pulmonary nodularity throughout right apical nodule 5 mm and nodularity along the left major fissure measuring 9 mm.  Heterogeneous density throughout the liver.,  Possible mild bilateral hilar adenopathy  MR abdomen July 08, 2020 hyperenhancing area in the medial segment of the left hepatic lobe, background of nodular hepatic contours and  hepatic heterogeneity atypical for sarcoid associated lesion given arterial enhancement other areas more typical for sarcoid but remain nonspecific.,  Splenic lesions noted.  Potential portal hypertension with varices versus distal esophageal hyperenhancement, no signs of steatosis  Labs June 19, 2020: QuantiFERON gold negative, Sjogren's negative, LFTs normal, rheumatoid factor positive, CCP negative, INR 1.1, allergy profile negative, IgE 67, Anti-DNA antibody negative, hepatic panel normal, platelets normal, WBC 7.1 ACE level elevated 82, sed rate greater than 130, vitamin D 8, hemoglobin 9.3 hematocrit 28  2D echo July 07, 2020 EF 55 to 60%, mild LVH, pulmonary artery pressure normal  Previous iron levels were noted low  HIV 2019 neg.   OV 11/22/2021  Subjective:  Patient ID: Ashley Henry, female , DOB: Nov 05, 1975 , age 17 y.o. , MRN: [id number] , ADDRESS: [address] [place] [address] PCP Kristian Covey, MD Patient Care Team: Kristian Covey, MD as PCP - General  This Provider for this visit: Treatment Team:  Attending Provider: Kalman Shan, MD    11/22/2021 -   Chief Complaint  Patient presents with   Follow-up    Pt states she is coughing a lot each day and also states  that her skin has been breaking out. States first thing in the morning she will cough up a lot of phlegm. States that she does have increased SOB especially when exercising.     ICD-10-CM   1. Sarcoidosis  D86.9     2. Chronic cough  R05.3     3. Dyspnea on exertion  R06.09     4. Mediastinal adenopathy  R59.0     5. Vitamin D deficiency  E55.9     6. Anemia, unspecified type  D64.9     7. Rheumatoid factor positive  R76.8     8. Abnormal CT of liver  R93.2     9. Pigmented skin lesions  L81.9        HPI Ashley Henry 47 y.o. -returns for follow-up.  Last seen personally in 2021.  At that time the concern was all his symptoms are pointing toward sarcoidosis.  She  then saw a nurse practitioner Rikki Spearing in October 2021.  She was confirmed to have sarcoidosis clinically based on high angiotensin-converting enzyme, African-American ethnicity, age and symptoms and CT scan findings of the lung.  She was then prescribed prednisone for 3 months she says during this time all the symptoms resolved.  She did not have insurance so did not do any further work-up.  She says then early last year she ran out of prednisone and since then having significant symptoms.  The symptoms include significant amount of night sweats, shortness of breath and wheezing.  Also severe cough day and night.  In the early morning the cough is productive with yellow sputum but rest of the day the cough is dry.  She works as a Airline pilot and as she talks the cough is even worse.  She showed a voice recording of the cough and there is clear laryngeal component to the cough.  Symptoms are rated as severe.  Cough not responding to Tessalon cough.  During this time she had skin lesions and arthralgia.  The skin lesions on the hands forehead.  There is also drying of the palms.  There is arthralgia diffusely.  Vitamin D deficiency: This was diagnosed in 2021.  Not on treatment currently  Anemia: This was diagnosed in 2021.  Not on treatment currently   Liver lesion seen on MRI along with splenic lesions: These were reported as atypical for sarcoid.  She never saw a GI  Skin lesions: This is new since last visit.  She is open to seeing dermatologist.      OV 12/18/2021  Subjective:  Patient ID: Ashley Henry, female , DOB: 1975-12-31 , age 11 y.o. , MRN: [id number] , ADDRESS: [address] [place] [address] PCP Kristian Covey, MD Patient Care Team: Kristian Covey, MD as PCP - General    12/18/2021 -this video visit is to review test results done due to concern of sarcoidosis.   HPI Ashley Henry 47 y.o. -since her last visit a few weeks ago she continues to have  the same symptoms of arthralgia and shortness of breath.  And skin lesions.  Referred her to the dermatologist and GI because of concerns of splenic lesions.  She has not yet seen them.  At this point in time she is focused on getting through symptom relief.  We did do investigations and they are as follows  CT scan of the chest: Shows worsening sarcoidosis.  I visualized the film and also showed it to the video  Pulmonary  function test: Not yet done  Blood work: Shows worsening angiotensin-converting enzyme level to greater than 180.  Persistently high ESR.Marland Kitchen  Rheumatoid factor continues to be trace positive.  Continues to be iron deficient anemic with a hemoglobin 9.9 g% which is baseline since 2014.  She says primary care physician has not yet addressed this.  IMPRESSION: 1. There has been dramatic increase in parenchymal lung involvement with extensive progressive perilymphatic nodularity, as detailed above, compatible with worsening sarcoidosis. 2. Multiple large low-attenuation splenic lesions, nonspecific, but likely reflective of sarcoid involvement in the spleen. 3. Dilatation of the pulmonic trunk (3.7 cm in diameter), concerning for pulmonary arterial hypertension. 4. Aortic atherosclerosis.   Aortic Atherosclerosis (ICD10-I70.0).     Electronically Signed   By: Trudie Reed M.D.   On: 12/12/2021 07:41   01/28/2022 -   Chief Complaint  Patient presents with   Follow-up    PFT performed 01/25/22. Pt states she has been doing okay since last visit and denies any complaints.     HPI Ashley Henry 47 y.o. -last office visit April 2023 on video.  Presents with her daughter who is at the bedside.  Diagnosis worsening sarcoidosis symptomatic given.  Commenced on prednisone.  I wrote for prednisone taper of 40 mg x 2 weeks and then 20 mg x 2 weeks.  The pharmacist informed that I skipped a dose of 30 mg for 2 weeks.  Therefore she took 40 mg for 2 weeks and then 30 mg for 2  weeks.  She is currently on 20 mg daily and she is finished 1 week of that.  She wants to extend the taper and go to 10 mg/day and then continue that per instructions.  The prednisone is really helped shortness of breath is improved cough is improved.  Her skin rash in the face and hands have improved.  Nevertheless she is having prednisone side effects of increased hunger 10 pound weight gain decreased sleep increased alertness.  Also facial puffiness.  Nevertheless she is continuing with the prednisone and she is okay doing that.  We did get an echocardiogram.  Previous echocardiogram 2021 was normal.  Currently is reporting moderate to severe mitral regurgitation and rater than 4 cm aortic root dilatation.  I did inform her of these findings.  I also passed a message on to Dr. Dulce Sellar her cardiologist.  Pulmonary function shows mild restriction with mild reduction in diffusion capacity.    OV 05/14/2022  Subjective:  Patient ID: Ashley Henry, female , DOB: Jan 15, 1976 , age 40 y.o. , MRN: [id number] , ADDRESS: [address] [place] [address] PCP Kristian Covey, MD Patient Care Team: Kristian Covey, MD as PCP - General  This Provider for this visit: Treatment Team:  Attending Provider: Kalman Shan, MD    05/14/2022 -   Chief Complaint  Patient presents with   Follow-up    PFT performed today.  Pt states she is about the same compared to last visit.   Follow-up pulmonary sarcoidosis last CT scan March 2023  HPI Ashley Henry 47 y.o. -returns for follow-up.  At the last visit her symptoms and the skin lesions were better after prednisone dosage.  We started tapering the prednisone.  Today she presents for follow-up.  She is now on 10 mg/day prednisone but she says with the taper cough is worse shortness of breath is worse wheezing is worse.  In addition skin lesions are also getting worse.  She is  still gaining weight though she states she is gained another 10  pounds of weight on the current 10 mg of prednisone.  She is frustrated by the worsening symptoms.  She did a pulmonary function test today.  This shows worsening of FVC and FEV1 however she did have significant amount of cough which is part of her symptoms from the sarcoid.  Therefore the results are somewhat unreliable.  Her daughter is here with her today.  She had normal G6PD testing are slightly high back in May 2023 March 2023 the QuantiFERON gold was negative. She has iron deficiency anemia when last checked in March 2023  She has mitral valve regurgitation and she saw Dr. Dulce Sellar and a TEE is pending very shortly.  She does admit to history of palpitations.  She does not recall having MRI of the heart or some kind of arrhythmia monitoring program.  I have written to Dr. Dulce Sellar about this.  We discussed the fact that she has progressive pulm sarcoidosis symptoms and the prednisone is causing side effects and also in addition with the prednisone taper she is having more symptoms.  Last visit we discussed the second line methotrexate therapy.  She is open to the idea.  We went over the side effects in detail including common and rare side effects and the requirement for significant amount of blood monitoring.  She is agreeable to this.  We discussed referral to [facility] or [facility] for clinical trial participation by [employer] but she is not interested.  We discussed about seeing Dr. Mechele Collin in our practice resident sarcoid expert but at this point in time she wants to hold off.       OV 08/15/2022  Subjective:  Patient ID: Ashley Henry, female , DOB: 21-May-1976 , age 17 y.o. , MRN: [id number] , ADDRESS: [address] [place] [address] PCP Kristian Covey, MD Patient Care Team: Kristian Covey, MD as PCP - General  This Provider for this visit: Treatment Team:  Attending Provider: Kalman Shan, MD  Follow-up pulmonary sarcoidosis last CT scan March  2023  08/15/2022 -   Chief Complaint  Patient presents with   Follow-up    Doing good with prednisone currently     HPI Ashley Henry 47 y.o. -returns for follow-up.  At this point in time she is back on prednisone 10 mg/day.  She finds this dose to be helpful for her.  Early in the morning she has severe cough that last 30 to 60 minutes and it has white sputum.  Then she takes a prednisone 10 mg/day and the cough goes away.  Overall quality of life is better no shortness of breath rest of the day there is no cough.  Her skin lesions in the face have resolved.  The skin lesions in the hands do persist but they are improved.  She is open to getting a dermatology referral right now.  She did have mitral regurgitation on the 2D echo when she had a TEE and it is only mild.  This was in August 2023 and I reviewed that result.  At this point in time she is content taking prednisone 10 mg/day for her sarcoid.  She does not want methotrexate.  In the past she declined to clinical trials.  Review of the labs indicate that she had anemia in March 2023.  I will will offer her repeat CBC testing.  We discussed vaccination she has had a COVID-vaccine but not  her flu.  She has never had a flu shot and does not intend to.     OV 09/24/2022  Subjective:  Patient ID: Ashley Henry, female , DOB: [birth year] , age 33 y.o. , MRN: [id number] , ADDRESS: [address] [place] [address] PCP Kristian Covey, MD Patient Care Team: Kristian Covey, MD as PCP - General  This Provider for this visit: Treatment Team:  Attending Provider: Kalman Shan, MD    09/24/2022 -   Chief Complaint  Patient presents with   Follow-up    Breathing has been good,winded with walking.      HPI Ashley Henry 47 y.o. -returns for follow-up with her daughter.  She is giving the history.  She tells me since last visit she has had 2 trips to [place] 1 time in November 2023 and the other 1 just this past  weekend.  She is having insidious onset of dyspnea on exertion relieved by rest.  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  Is been going on for 3 or 4 weeks relieved by rest.  She is also reporting bilateral ankle swelling around the same time.  Particularly with sitting.  She went [place] by car and it was definitely present.  Her baseline cough early in the morning for 30 minutes is unchanged.  This cough is made worse by perfume and smoke but now also present with the new onset of dyspnea.  She does have very streaky mild hemoptysis and thick yellow sputum but this is baseline.  Her hemoglobin was last checked a year ago and she was anemic.  I reviewed this result again.  She is wondering about getting a repeat CT scan of the chest to reassess her sarcoid.  She had a TEE in summer 2023 and she only had mild mitral valve prolapse.  I reviewed this result again.     OV 12/06/2022  Subjective:  Patient ID: Ashley Henry, female , DOB: Sep 25, 1975 , age 63 y.o. , MRN: [id number] , ADDRESS: [address] [place] [address] PCP Kristian Covey, MD Patient Care Team: Kristian Covey, MD as PCP - General  This Provider for this visit: Treatment Team:  Attending Provider: Kalman Shan, MD    12/06/2022 -   Chief Complaint  Patient presents with   Follow-up    Sarcoidosis f/up     HPI Ashley Henry 47 y.o. -returns for follow-up.  She has not had a pulmonary function test today.  She states that she is on 10 mg prednisone taking it compliant.  Shortness of breath is improved although there is residual shortness of breath.  Ankle edema from previous visit is also resolved.  She is walking and this is helping her.  Her skin lesions are also improved.  However there are side effects from the prednisone her hemoglobin A1c in January 2024 is gone up to greater than 7.  Her blood pressure today is elevated 180/120.  This the  first time it has been elevated.  She is not on antihypertensives.  Iron deficiency anemia continues.  She states she is not taking iron pills because she does not want to.  She is also got diminished sleep 25% of the time.  She is open to reducing her prednisone to 7.5 mg/day.  Her cardiologist Dr. Dulce Sellar is retiring.  She is upcoming cardiology appointment with a new cardiologist in  May 2024.  Otherwise no new issues.         OV 02/20/2023  Subjective:  Patient ID: Ashley Henry, female , DOB: 10/16/1975 , age 56 y.o. , MRN: [id number] , ADDRESS: [address] [place] [address] PCP Kristian Covey, MD Patient Care Team: Kristian Covey, MD as PCP - General  This Provider for this visit: Treatment Team:  Attending Provider: Kalman Shan, MD    02/20/2023 -   Chief Complaint  Patient presents with   Follow-up    SOB with exertion.  Cough and wheeze persistent.     HPI Ashley Henry 47 y.o. -follow-up pulmonary sarcoidosis on prednisone  Returns for follow-up.  Presents with her daughter Ashley Henry.  She is now on a reduced dose of prednisone 7.5 mg/day.  So far there is no recurrence of rash.  In the past reducing prednisone is caused flareup of sarcoid.  However when prednisone is increased her blood pressure is high and her blood sugar goes up.  Most recent hemoglobin A1c 7.2.  She not on any treatment for this.  She ideally wants to come off the prednisone because this is prednisone related hyperglycemia.  She is agreed to drop her prednisone to 5 mg/day.  She is gotten hypertensive and since last visit she is on hydrochlorothiazide and also Cozaar.  Her blood pressure has improved with a systolic of 132 today.  From a respiratory standpoint her pulmonary function test is stable compared to August 2023 but is still down compared to 1 year ago.  If this is very similar at 1.98 L / 54% and DLCO of 12.47/58%.  We talked about steroid sparing options.  She is  quite interested at this time and infliximab.  We went over methotrexate again.  She is more fearful of methotrexate even though this is less immunosuppressive.  This because of the side effect profile and personal experience she has heard from other people.  We went over TNF alpha blockade infliximab.  Went over the side effect profile on up-to-date that includes infusion reactions abdominal symptoms headaches and also long-term risk for opportunistic infections.  She is interested in this because the schedule convenience and other reasons.  She wants to meet with the pharmacist about this.  She wants to go ahead.  She is particular interested in the steroid sparing effects of this.    In terms of other issues - She has new cardiologist Dr Bing Matter cardiac MRI was recommended with this being changed to PET scan because of dye allergy.  She has had a ZIO monitor but the results are pending.  Reviewed the external records on this.   OV 05/16/2023  Subjective:  Patient ID: Ashley Henry, female , DOB: October 14, 1975 , age 48 y.o. , MRN: [id number] , ADDRESS: [address] [place] [address] PCP Kristian Covey, MD Patient Care Team: Kristian Covey, MD as PCP - General  This Provider for this visit: Treatment Team:  Attending Provider: Kalman Shan, MD    05/16/2023 -   Chief Complaint  Patient presents with   Follow-up    F/up on sarcoidosis     HPI Ashley Henry 47 y.o. -returns for follow-up of her sarcoidosis.  After the last visit started process for TNF alpha blockade.  However pharmacy has not been able to get in touch with her.  She tells me that she is not interested in infliximab because of the side effect profile.  Therefore she does not want to do it.  In the interim she did see primary care on 04/23/2023 because of boyfriend had positive QuantiFERON gold test in March 2023.  Was checked again by the primary care on 04/23/2023 and again negative.  She did see  cardiology on 04/29/2023.  Appears she did have a TEE and the mitral regurgitation is only mild.  Pro BNP was checked and this was normal.  Cardiology is working towards getting a PET scan in the presence of allergy to MRI dye.  She told me this is scheduled next week.  The main issue is that week or 2 after last visit she picked up her skin lesion on her right dorsum that was very suspicious for sarcoid.  She did see dermatology.  Fluticasone did not resolve it so she went up on the prednisone to 20 mg/day and then this resolved it.  This happened approximately a month ago when she was taking 20 mg/day.  For the last 1 week she is on back on 5 mg baseline.  Currently she just has mild cough and she is able to tolerate it.  She is not interested in methotrexate or clinical trials or infliximab.  Overall she deems this all stable.   PFT     Latest Ref Rng & Units 02/20/2023    3:38 PM 05/14/2022    3:33 PM 01/25/2022   12:10 PM  ILD indicators  FVC-Pre L 1.98  1.95  2.30   FVC-Predicted Pre % 54  53  76   FVC-Post L   2.16   FVC-Predicted Post %   71   TLC L   3.17   TLC Predicted %   62   DLCO uncorrected ml/min/mmHg 12.47   15.20   DLCO UNC %Pred % 58   70   DLCO Corrected ml/min/mmHg 12.47   15.20   DLCO COR %Pred % 58   70       LAB RESULTS last 96 hours No results found.  LAB RESULTS last 90 days Recent Results (from the past 2160 hour(s))  Pulmonary function test     Status: None   Collection Time: 02/20/23  3:38 PM  Result Value Ref Range   FVC-Pre 1.98 L   FVC-%Pred-Pre 54 %   FEV1-Pre 1.11 L   FEV1-%Pred-Pre 37 %   FEV6-Pre 1.98 L   FEV6-%Pred-Pre 55 %   Pre FEV1/FVC ratio 56 %   FEV1FVC-%Pred-Pre 68 %   Pre FEV6/FVC Ratio 100 %   FEV6FVC-%Pred-Pre 102 %   FEF 25-75 Pre 0.73 L/sec   FEF2575-%Pred-Pre 24 %   DLCO unc 12.47 ml/min/mmHg   DLCO unc % pred 58 %   DLCO cor 12.47 ml/min/mmHg   DLCO cor % pred 58 %   DL/VA 4.01 ml/min/mmHg/L   DL/VA % pred 027 %   QuantiFERON-TB Gold Plus     Status: None   Collection Time: 04/23/23  7:59 AM  Result Value Ref Range   QuantiFERON-TB Gold Plus NEGATIVE NEGATIVE    Comment: Negative test result. M. tuberculosis complex  infection unlikely.    NIL 0.09 IU/mL   Mitogen-NIL 2.96 IU/mL   TB1-NIL 0.02 IU/mL   TB2-NIL 0.00 IU/mL    Comment: . The Nil tube value reflects the background interferon gamma immune response of the patient's blood sample. This value has been subtracted from the patient's displayed TB and Mitogen results. . Lower than expected results with the Mitogen tube prevent false-negative Quantiferon readings  by detecting a patient with a potential immune suppressive condition and/or suboptimal pre-analytical specimen handling. . The TB1 Antigen tube is coated with the M. tuberculosis-specific antigens designed to elicit responses from TB antigen primed CD4+ helper T-lymphocytes. . The TB2 Antigen tube is coated with the M. tuberculosis-specific antigens designed to elicit responses from TB antigen primed CD4+ helper and CD8+ cytotoxic T-lymphocytes. . For additional information, please refer to https://education.[facility].com/faq/FAQ204 (This link is being provided for informational/ educational purposes only.) .   Basic metabolic panel     Status: Abnormal   Collection Time: 04/29/23  9:10 AM  Result Value Ref Range   Glucose 138 (H) 70 - 99 mg/dL   BUN 30 (H) 6 - 24 mg/dL   Creatinine, Ser 3.24 (H) 0.57 - 1.00 mg/dL   eGFR 37 (L) >40 NU/UVO/5.36   BUN/Creatinine Ratio 18 9 - 23   Sodium 139 134 - 144 mmol/L   Potassium 4.3 3.5 - 5.2 mmol/L   Chloride 96 96 - 106 mmol/L   CO2 27 20 - 29 mmol/L   Calcium 10.0 8.7 - 10.2 mg/dL  Pro b natriuretic peptide (BNP)     Status: None   Collection Time: 04/29/23  9:10 AM  Result Value Ref Range   NT-Pro BNP 49 0 - 249 pg/mL    Comment: The following cut-points have been suggested for the use of proBNP for the  diagnostic evaluation of heart failure (HF) in patients with acute dyspnea: Modality                     Age           Optimal Cut                            (years)            Point ------------------------------------------------------ Diagnosis (rule in HF)        <50            450 pg/mL                           50 - 75            900 pg/mL                               >75           1800 pg/mL Exclusion (rule out HF)  Age independent     300 pg/mL          has a past medical history of Abnormal CT scan of lung (07/13/2020), Abnormal finding on imaging of liver (07/13/2020), Anemia, BACK PAIN, UPPER (07/27/2010), BRONCHITIS, ACUTE (07/16/2010), CELLULITIS, GREAT TOE (01/08/2010), Enlarged uterus (05/13/2011), Essential hypertension (09/07/2009), HEADACHE (08/03/2010), HYPERLIPIDEMIA (10/11/2009), Hypertension (2010), INGROWN NAIL (02/08/2010), Iron deficiency anemia due to chronic blood loss (06/20/2014), Kidney stone (10/30/2015), OBESITY (10/11/2009), PARESTHESIA (08/03/2010), Pre-diabetes, Renal disorder, SINUSITIS, ACUTE (12/07/2009), SORE THROAT (02/14/2010), and Vitamin D deficiency (07/13/2020).   reports that she has never smoked. She has never used smokeless tobacco.  Past Surgical History:  Procedure Laterality Date   BREAST REDUCTION SURGERY  1999   BUBBLE STUDY  05/16/2022   Procedure: BUBBLE STUDY;  Surgeon: Pricilla Riffle, MD;  Location: [facility] ENDOSCOPY;  Service: Cardiovascular;;   CESAREAN SECTION  2004   Cysts removed from  chest wall     EYE SURGERY Left    MASS EXCISION N/A 04/14/2018   Procedure: EXCISION OF CHEST WALL MASS;  Surgeon: Abigail Miyamoto, MD;  Location: [facility];  Service: General;  Laterality: N/A;   TEE WITHOUT CARDIOVERSION N/A 05/16/2022   Procedure: TRANSESOPHAGEAL ECHOCARDIOGRAM (TEE);  Surgeon: Pricilla Riffle, MD;  Location: [facility] ENDOSCOPY;  Service: Cardiovascular;  Laterality: N/A;    Allergies  Allergen Reactions   Multihance  [Gadobenate] Nausea And Vomiting   Collagenase Hives   Collagenase Clostridium Histolyticum Hives   Aspirin Rash    Immunization History  Administered Date(s) Administered   PFIZER(Purple Top)SARS-COV-2 Vaccination 12/30/2020, 01/20/2021, 06/30/2021   PPD Test 03/29/2020   Td 09/04/2009   Td (Adult),5 Lf Tetanus Toxid, Preservative Free 09/04/2009   Tdap 09/04/2009    Family History  Problem Relation Age of Onset   Hypertension Mother    Diabetes Mother    Hypertension Father    Stomach cancer Paternal Grandmother    Stomach cancer Maternal Uncle    Heart disease Maternal Uncle    Lung cancer Maternal Grandfather    Prostate cancer Maternal Uncle    Thyroid disease Maternal Uncle      Current Outpatient Medications:    albuterol (PROVENTIL) (2.5 MG/3ML) 0.083% nebulizer solution, Take 3 mLs (2.5 mg total) by nebulization every 6 (six) hours as needed for wheezing or shortness of breath., Disp: 75 mL, Rfl: 5   albuterol (VENTOLIN HFA) 108 (90 Base) MCG/ACT inhaler, INHALE 2 PUFFS INTO THE LUNGS EVERY 6 HOURS AS NEEDED FOR WHEEZING OR SHORTNESS OF BREATH (Patient taking differently: Inhale 2 puffs into the lungs every 6 (six) hours as needed for wheezing or shortness of breath.), Disp: 18 g, Rfl: 2   amLODipine (NORVASC) 5 MG tablet, Take 1 tablet (5 mg total) by mouth daily., Disp: 30 tablet, Rfl: 5   benzonatate (TESSALON) 100 MG capsule, Take 1 capsule every 8 hours as needed for cough, Disp: 60 capsule, Rfl: 2   BEPREVE 1.5 % SOLN, Place 1 drop into both eyes daily., Disp: 10 mL, Rfl: 5   cyclobenzaprine (FLEXERIL) 5 MG tablet, TAKE 1 TABLET BY MOUTH EVERY 8 HOURS AS NEEDED FOR MUSCLE SPASM (Patient taking differently: Take 5 mg by mouth 3 (three) times daily as needed for muscle spasms. Take 1 tablet by mouth every 8 hours as needed for muscle spasm), Disp: 60 tablet, Rfl: 0   ferrous sulfate (SLOW IRON) 160 (50 Fe) MG TBCR SR tablet, Take 1 tablet (160 mg total) by mouth  daily., Disp: 30 tablet, Rfl: 3   fluticasone (CUTIVATE) 0.05 % cream, Apply topically 2 (two) times daily. (Patient taking differently: Apply 1 Application topically 2 (two) times daily.), Disp: 15 g, Rfl: 0   hydrochlorothiazide (HYDRODIURIL) 25 MG tablet, Take 1 tablet (25 mg total) by mouth daily., Disp: 30 tablet, Rfl: 3   hydrocortisone butyrate (LOCOID) 0.1 % CREA cream, Apply 1 Application topically 2 (two) times daily., Disp: 45 g, Rfl: 2   Norgestimate-Ethinyl Estradiol Triphasic (TRI-LO-SPRINTEC) 0.18/0.215/0.25 MG-25 MCG tab, Take 1 tablet by mouth daily., Disp: 28 tablet, Rfl: 3   omeprazole (PRILOSEC) 40 MG capsule, TAKE 1 CAPSULE(40 MG) BY MOUTH DAILY (Patient taking differently: Take 40 mg by mouth daily. TAKE 1 CAPSULE(40 MG) BY MOUTH DAILY), Disp: 90 capsule, Rfl: 0   predniSONE (DELTASONE) 5 MG tablet, TAKE 1 AND 1/2 TABLETS(7.5 MG) BY MOUTH DAILY WITH BREAKFAST, Disp: 90 tablet, Rfl: 1   TRI-LO-MILI  0.18/0.215/0.25 MG-25 MCG tab, TAKE 1 TABLET BY MOUTH DAILY, Disp: 28 tablet, Rfl: 2   losartan (COZAAR) 25 MG tablet, Take 1 tablet (25 mg total) by mouth daily., Disp: 90 tablet, Rfl: 3      Objective:   Vitals:   05/16/23 0904  BP: (!) 140/80  Pulse: (!) 106  SpO2: 98%  Height: 5\' 4"  (1.626 m)    Estimated body mass index is 47.03 kg/m as calculated from the following:   Height as of this encounter: 5\' 4"  (1.626 m).   Weight as of 04/29/23: 274 lb (124.3 kg).  @WEIGHTCHANGE @  [employer]     Physical Exam   General: No distress. Looks well O2 at rest: no Cane present: no Sitting in wheel chair: no Frail: no Obese: yes Neuro: Alert and Oriented x 3. GCS 15. Speech normal Psych: Pleasant Resp:  Barrel Chest - no.  Wheeze - no, Crackles - no, No overt respiratory distress CVS: Normal heart sounds. Murmurs - no Ext: Stigmata of Connective Tissue Disease - no HEENT: Normal upper airway. PEERL +. No post nasal drip        Assessment:       ICD-10-CM    1. Sarcoidosis  D86.9     2. Chronic cough  R05.3     3. Dyspnea on exertion  R06.09     4. Pigmented skin lesions  L81.9          Plan:     Patient Instructions  Sarcoidosis Chronic cough Dyspnea on exertion Mediastinal adenopathy Current use of chronic steroids   - Shortness of breath and PFT stable on prednisone 5 mg per day.   Some mild cough - Noted and respect reluctance for TNF alpha blockade infliximab because of side effect concern. -Noted that you are going to have your PET scan for cardiac sarcoid next week -Noted recent rash on the dorsum that resolved with higher dose prednisone but now is back on 5 mg   Plan -await PET scan by cardiology- continue prednisone  but drop to 5mg  per day -Respect no interest in methotrexate, or infliximab or clinical trials due to safety concern   HYPERTENSION 180/120 on 12/06/2022 and 132/90 on 02/20/2023 Elevated HgbA1c Jan 2024 and March 2024  - likely prednisone contributing  Plan -According to primary care physician   Anemia, unspecified type -noted in October 2021. Persistent on May 2024; hgb 9.9  Seems like Iron Deficiency anemia. Ongoing May 2024. Not taking iron pills per personal choice  Plan -monitor and support from primary care physician   Pigmented skin lesions  -Very suspicious of sarcoidosis.  Flared up in the right dorsum of the hand after recent visit June 2024  Plan - monitor for relapse with lower prednisone of 5mg  pre day  Rheumatoid factor positive -mildly positive in 2021 and persistent though March 2024 Associted arthralgia  - can be sarcoid arthritis versus RA but glad joint pain improved  and not much this visit 12/06/2022   Plan  - monitor -  - at some point can decide on rheumatology referral   Abnormal CT of liver along with MRI of the liver and spleen in 2021 ad in CT March 2023   Plan - Refer to gastroenterology some time in future   HY   Follow-up - 6 months  with Dr  Marchelle Gearing -15-minute visit   FOLLOWUP Return in about 6 months (around 11/14/2023) for 15 min visit, Sarcoidosis, with Dr Marchelle Gearing, Face to Face OR  Video Visit.    SIGNATURE    Dr. Kalman Shan, M.D., F.C.C.P,  Pulmonary and Critical Care Medicine Staff Physician, [facility] Center Director - Interstitial Lung Disease  Program  Pulmonary Fibrosis [facility] [place], [place], [address]  Pager: [phone], If no answer or between  15:00h - 7:00h: call [phone] Telephone: [phone]  9:28 AM 05/16/2023   Moderate Complexity MDM OFFICE  2021 E/M guidelines, first released in 2021, with minor revisions added in 2023 and 2024 Must meet the requirements for 2 out of 3 dimensions to qualify.    Number and complexity of problems addressed Amount and/or complexity of data reviewed Risk of complications and/or morbidity  One or more chronic illness with mild exacerbation, OR progression, OR  side effects of treatment  Two or more stable chronic illnesses  One undiagnosed new problem with uncertain prognosis  One acute illness with systemic symptoms   One Acute complicated injury Must meet the requirements for 1 of 3 of the categories)  Category 1: Tests and documents, historian  Any combination of 3 of the following:  Assessment requiring an independent historian  Review of prior external note(s) from each unique source  Review of results of each unique test  Ordering of each unique test    Category 2: Interpretation of tests   Independent interpretation of a test performed by another physician/other qualified health care professional (not separately reported)  Category 3: Discuss management/tests  Discussion of management or test interpretation with external physician/other qualified health care professional/appropriate source (not separately reported) Moderate risk of morbidity from additional  diagnostic testing or treatment Examples only:  Prescription drug management  Decision regarding minor surgery with identfied patient or procedure risk factors  Decision regarding elective major surgery without identified patient or procedure risk factors  Diagnosis or treatment significantly limited by social determinants of health             HIGh Complexity  OFFICE   2021 E/M guidelines, first released in 2021, with minor revisions added in 2023. Must meet the requirements for 2 out of 3 dimensions to qualify.    Number and complexity of problems addressed Amount and/or complexity of data reviewed Risk of complications and/or morbidity  Severe exacerbation of chronic illness  Acute or chronic illnesses that may pose a threat to life or bodily function, e.g., multiple trauma, acute MI, pulmonary embolus, severe respiratory distress, progressive rheumatoid arthritis, psychiatric illness with potential threat to self or others, peritonitis, acute renal failure, abrupt change in neurological status Must meet the requirements for 2 of 3 of the categories)  Category 1: Tests and documents, historian  Any combination of 3 of the following:  Assessment requiring an independent historian  Review of prior external note(s) from each unique source  Review of results of each unique test  Ordering of each unique test    Category 2: Interpretation of tests    Independent interpretation of a test performed by another physician/other qualified health care professional (not separately reported)  Category 3: Discuss management/tests  Discussion of management or test interpretation with external physician/other qualified health care professional/appropriate source (not separately reported)  HIGH risk of morbidity from additional diagnostic testing or treatment Examples only:  Drug therapy requiring intensive monitoring for toxicity  Decision for elective major surgery with  identified pateint or procedure risk factors  Decision regarding hospitalization or escalation of level of care  Decision  for DNR or to de-escalate care   Parenteral controlled  substances            LEGEND - Independent interpretation involves the interpretation of a test for which there is a CPT code, and an interpretation or report is customary. When a review and interpretation of a test is performed and documented by the provider, but not separately reported (billed), then this would represent an independent interpretation. This report does not need to conform to the usual standards of a complete report of the test. This does not include interpretation of tests that do not have formal reports such as a complete blood count with differential and blood cultures. Examples would include reviewing a chest radiograph and documenting in the medical record an interpretation, but not separately reporting (billing) the interpretation of the chest radiograph.   An appropriate source includes professionals who are not health care professionals but may be involved in the management of the patient, such as a Clinical research associate, upper officer, case manager or teacher, and does not include discussion with family or informal caregivers.    - SDOH: SDOH are the conditions in the environments where people are born, live, learn, work, play, worship, and age that affect a wide range of health, functioning, and quality-of-life outcomes and risks. (e.g., housing, food insecurity, transportation, etc.). SDOH-related Z codes ranging from Z55-Z65 are the ICD-10-CM diagnosis codes used to document SDOH data Z55 - Problems related to education and literacy Z56 - Problems related to employment and unemployment Z57 - Occupational exposure to risk factors Z58 - Problems related to physical environment Z59 - Problems related to housing and economic circumstances [phone] - Problems related to social environment [phone] -  Problems related to upbringing [phone] - Other problems related to primary support group, including family circumstances Z9 - Problems related to certain psychosocial circumstances Z65 - Problems related to other psychosocial circumstances

## 2023-05-16 NOTE — Patient Instructions (Addendum)
Sarcoidosis Chronic cough Dyspnea on exertion Mediastinal adenopathy Current use of chronic steroids   - Shortness of breath and PFT stable on prednisone 5 mg per day.   Some mild cough - Noted and respect reluctance for TNF alpha blockade infliximab because of side effect concern. -Noted that you are going to have your PET scan for cardiac sarcoid next week -Noted recent rash on the dorsum that resolved with higher dose prednisone but now is back on 5 mg   Plan -await PET scan by cardiology- continue prednisone  but drop to 5mg  per day -Respect no interest in methotrexate, or infliximab or clinical trials due to safety concern   HYPERTENSION 180/120 on 12/06/2022 and 132/90 on 02/20/2023 Elevated HgbA1c Jan 2024 and March 2024  - likely prednisone contributing  Plan -According to primary care physician   Anemia, unspecified type -noted in October 2021. Persistent on May 2024; hgb 9.9  Seems like Iron Deficiency anemia. Ongoing May 2024. Not taking iron pills per personal choice  Plan -monitor and support from primary care physician   Pigmented skin lesions  -Very suspicious of sarcoidosis.  Flared up in the right dorsum of the hand after recent visit June 2024  Plan - monitor for relapse with lower prednisone of 5mg  pre day  Rheumatoid factor positive -mildly positive in 2021 and persistent though March 2024 Associted arthralgia  - can be sarcoid arthritis versus RA but glad joint pain improved  and not much this visit 12/06/2022   Plan  - monitor -  - at some point can decide on rheumatology referral   Abnormal CT of liver along with MRI of the liver and spleen in 2021 ad in CT March 2023   Plan - Refer to gastroenterology some time in future   HY   Follow-up - 6 months  with Dr Marchelle Gearing -15-minute visit

## 2023-05-22 ENCOUNTER — Encounter (HOSPITAL_COMMUNITY): Payer: Self-pay

## 2023-05-23 ENCOUNTER — Other Ambulatory Visit: Payer: Commercial Managed Care - HMO

## 2023-05-26 ENCOUNTER — Telehealth (HOSPITAL_COMMUNITY): Payer: Self-pay | Admitting: *Deleted

## 2023-05-26 NOTE — Telephone Encounter (Signed)
Attempted to call patient regarding upcoming cardiac PET appointment. Left message on voicemail with name and callback number  Merle Prescott RN Navigator Cardiac Imaging [facility] [phone] Office [phone] Cell  

## 2023-05-27 ENCOUNTER — Telehealth (HOSPITAL_COMMUNITY): Payer: Self-pay | Admitting: *Deleted

## 2023-05-27 ENCOUNTER — Other Ambulatory Visit: Payer: Self-pay | Admitting: Family Medicine

## 2023-05-27 NOTE — Telephone Encounter (Signed)
Patient returning call about her upcoming cardiac imaging study; pt verbalizes understanding of appt date/time, parking situation and where to check in, pre-test NPO status  name and call back number provided for further questions should they arise  Larey Brick RN Navigator Cardiac Imaging [facility] [phone] office [phone] cell  Patient states she understands diet prep.

## 2023-05-27 NOTE — Telephone Encounter (Signed)
Attempted to call patient regarding upcoming cardiac PET appointment. Left message on voicemail with name and callback number  Merle Prescott RN Navigator Cardiac Imaging [facility] [phone] Office [phone] Cell  

## 2023-05-28 ENCOUNTER — Ambulatory Visit (HOSPITAL_COMMUNITY)
Admission: RE | Admit: 2023-05-28 | Discharge: 2023-05-28 | Disposition: A | Discharge status: Home / Self Care | Payer: Commercial Managed Care - HMO | Source: Ambulatory Visit | Attending: Cardiology | Admitting: Cardiology

## 2023-05-28 DIAGNOSIS — D869 Sarcoidosis, unspecified: Secondary | ICD-10-CM | POA: Insufficient documentation

## 2023-05-28 NOTE — Progress Notes (Signed)
Patient presented to her cardiac PET sarcoid today (9/11) having following the diet prep. However due to scanner problems, patient did not wish to stay for her test. She has been rescheduled and verbalized understanding of the new date and time and how to prep for test.  Larey Brick RN Navigator Cardiac Imaging [facility] [phone] Office [phone] Cell

## 2023-05-31 ENCOUNTER — Other Ambulatory Visit: Payer: Self-pay | Admitting: Family Medicine

## 2023-06-05 ENCOUNTER — Other Ambulatory Visit: Payer: Commercial Managed Care - HMO

## 2023-06-05 DIAGNOSIS — N289 Disorder of kidney and ureter, unspecified: Secondary | ICD-10-CM

## 2023-06-05 LAB — BASIC METABOLIC PANEL
BUN: 27 mg/dL — ABNORMAL HIGH (ref 6–23)
CO2: 29 mEq/L (ref 19–32)
Calcium: 9.4 mg/dL (ref 8.4–10.5)
Chloride: 100 mEq/L (ref 96–112)
Creatinine, Ser: 1.4 mg/dL — ABNORMAL HIGH (ref 0.40–1.20)
GFR: 45.44 mL/min — ABNORMAL LOW (ref >60.00)
Glucose, Bld: 117 mg/dL — ABNORMAL HIGH (ref 70–99)
Potassium: 3.6 mEq/L (ref 3.5–5.1)
Sodium: 140 mEq/L (ref 135–145)

## 2023-06-05 NOTE — Addendum Note (Signed)
Addended by: Donald Pore A on: 06/05/2023 07:27 AM   Modules accepted: Orders

## 2023-06-10 ENCOUNTER — Other Ambulatory Visit: Payer: Self-pay | Admitting: Family Medicine

## 2023-06-18 ENCOUNTER — Inpatient Hospital Stay (HOSPITAL_COMMUNITY)
Admission: RE | Admit: 2023-06-18 | Discharge: 2023-06-18 | Disposition: A | Discharge status: Home / Self Care | Payer: Commercial Managed Care - HMO | Source: Ambulatory Visit | Attending: Cardiology | Admitting: Cardiology

## 2023-06-30 ENCOUNTER — Other Ambulatory Visit: Payer: Self-pay | Admitting: Family Medicine

## 2023-07-06 ENCOUNTER — Other Ambulatory Visit: Payer: Self-pay | Admitting: Family Medicine

## 2023-07-06 DIAGNOSIS — Z3009 Encounter for other general counseling and advice on contraception: Secondary | ICD-10-CM

## 2023-07-07 ENCOUNTER — Ambulatory Visit: Payer: Commercial Managed Care - HMO | Admitting: Family Medicine

## 2023-07-08 ENCOUNTER — Other Ambulatory Visit: Payer: Self-pay | Admitting: Family Medicine

## 2023-07-12 ENCOUNTER — Encounter: Payer: Self-pay | Admitting: Internal Medicine

## 2023-07-14 ENCOUNTER — Encounter: Payer: Self-pay | Admitting: Family Medicine

## 2023-07-14 ENCOUNTER — Other Ambulatory Visit: Payer: Self-pay | Admitting: Family Medicine

## 2023-07-14 ENCOUNTER — Telehealth (HOSPITAL_COMMUNITY): Payer: Self-pay | Admitting: *Deleted

## 2023-07-14 NOTE — Telephone Encounter (Signed)
Reaching out to patient to offer assistance regarding upcoming cardiac imaging study; pt verbalizes understanding of appt date/time, parking situation and where to check in, name and call back number provided for further questions should they arise  Larey Brick RN Navigator Cardiac Imaging [facility] [phone] office [phone] cell  Patient verbalized understanding of diet prep.

## 2023-07-16 ENCOUNTER — Ambulatory Visit (HOSPITAL_COMMUNITY)
Admission: RE | Admit: 2023-07-16 | Discharge: 2023-07-16 | Disposition: A | Discharge status: Home / Self Care | Payer: Commercial Managed Care - HMO | Source: Ambulatory Visit | Attending: Cardiology | Admitting: Cardiology

## 2023-07-16 DIAGNOSIS — R918 Other nonspecific abnormal finding of lung field: Secondary | ICD-10-CM | POA: Diagnosis not present

## 2023-07-16 DIAGNOSIS — D869 Sarcoidosis, unspecified: Secondary | ICD-10-CM | POA: Diagnosis present

## 2023-07-16 LAB — NM PET CT MYOCARDIAL SARCOIDOSIS
LV dias vol: 108 mL (ref 46–106)
Nuc Stress EF: 57 %
Rest Nuclear Isotope Dose: 29.5 mCi

## 2023-07-16 MED ORDER — FLUDEOXYGLUCOSE F - 18 (FDG) INJECTION
8.9600 | Freq: Once | INTRAVENOUS | Status: AC
  Administered 2023-07-16: 8.96 via INTRAVENOUS

## 2023-07-16 MED ORDER — RUBIDIUM RB82 GENERATOR (RUBYFILL)
29.5500 | PACK | Freq: Once | INTRAVENOUS | Status: AC
  Administered 2023-07-16: 29.55 via INTRAVENOUS

## 2023-07-30 ENCOUNTER — Encounter: Payer: Self-pay | Admitting: Family Medicine

## 2023-07-30 ENCOUNTER — Ambulatory Visit: Payer: Managed Care, Other (non HMO) | Admitting: Family Medicine

## 2023-07-30 VITALS — BP 126/80 | HR 110 | Temp 98.0°F | Ht 64.0 in | Wt 273.1 lb

## 2023-07-30 DIAGNOSIS — I10 Essential (primary) hypertension: Secondary | ICD-10-CM

## 2023-07-30 DIAGNOSIS — R739 Hyperglycemia, unspecified: Secondary | ICD-10-CM | POA: Diagnosis not present

## 2023-07-30 DIAGNOSIS — R7303 Prediabetes: Secondary | ICD-10-CM

## 2023-07-30 DIAGNOSIS — N1831 Chronic kidney disease, stage 3a: Secondary | ICD-10-CM | POA: Diagnosis not present

## 2023-07-30 LAB — POCT GLYCOSYLATED HEMOGLOBIN (HGB A1C): Hemoglobin A1C: 6.5 % — AB (ref 4.0–5.6)

## 2023-07-30 NOTE — Progress Notes (Signed)
Established Patient Office Visit  Subjective   Patient ID: Ashley Henry, female    DOB: 1976/07/06  Age: 47 y.o. MRN: [id number]  Chief Complaint  Patient presents with   Medical Management of Chronic Issues    HPI   Ashley Henry is seen for medical follow-up.  She has history of sarcoidosis and is on prednisone 7.5 mg daily per pulmonary.  She had some recent labs done initially through cardiology with elevated glucose and we had recommended follow-up here to get A1c.  Does have positive family history of type 2 diabetes in her mother.  Ashley Henry also had recent elevated creatinine 1.71 and on follow-up labs this was 1.45.  Denies any polyuria or polydipsia.  She has had some weight gain since being on the prednisone.  She has blood pressure which is controlled with HCTZ and amlodipine.  Occasional mild peripheral edema improved with hydrochlorothiazide  Past Medical History:  Diagnosis Date   Abnormal CT scan of lung 07/13/2020   Abnormal finding on imaging of liver 07/13/2020   Anemia    BACK PAIN, UPPER 07/27/2010   Qualifier: Diagnosis of  By: Caryl Never MD, Sukhraj Esquivias     BRONCHITIS, ACUTE 07/16/2010   Qualifier: Diagnosis of  By: Caryl Never MD, Carlena Hurl, GREAT TOE 01/08/2010   Qualifier: Diagnosis of  By: Caryl Never MD, Evertt Chouinard     Enlarged uterus 05/13/2011   Essential hypertension 09/07/2009   Qualifier: Diagnosis of  By: Yetta Barre MD, Bernadene Bell.    HEADACHE 08/03/2010   Qualifier: Diagnosis of  By: Caryl Never MD, Lakeidra Reliford     HYPERLIPIDEMIA 10/11/2009   Hypertension 2010   no meds now   INGROWN NAIL 02/08/2010   Qualifier: Diagnosis of  By: Caryl Never MD, Laruen Risser     Iron deficiency anemia due to chronic blood loss 06/20/2014   Kidney stone 10/30/2015   OBESITY 10/11/2009   Qualifier: Diagnosis of  By: Caryl Never MD, Todd Jelinski     PARESTHESIA 08/03/2010   Qualifier: Diagnosis of  By: Caryl Never MD, Trenell Moxey     Pre-diabetes    HgbA1c 6.2 on 09-30-17   Renal disorder    SINUSITIS, ACUTE  12/07/2009   Qualifier: Diagnosis of  By: Caryl Never MD, Dontez Hauss     SORE THROAT 02/14/2010   Qualifier: Diagnosis of  By: Gabriel Rung LPN, Sherron Ales D deficiency 07/13/2020   Past Surgical History:  Procedure Laterality Date   BREAST REDUCTION SURGERY  1999   BUBBLE STUDY  05/16/2022   Procedure: BUBBLE STUDY;  Surgeon: Pricilla Riffle, MD;  Location: [facility] ENDOSCOPY;  Service: Cardiovascular;;   CESAREAN SECTION  2004   Cysts removed from chest wall     EYE SURGERY Left    MASS EXCISION N/A 04/14/2018   Procedure: EXCISION OF CHEST WALL MASS;  Surgeon: Abigail Miyamoto, MD;  Location: [facility];  Service: General;  Laterality: N/A;   TEE WITHOUT CARDIOVERSION N/A 05/16/2022   Procedure: TRANSESOPHAGEAL ECHOCARDIOGRAM (TEE);  Surgeon: Pricilla Riffle, MD;  Location: [facility] ENDOSCOPY;  Service: Cardiovascular;  Laterality: N/A;    reports that she has never smoked. She has never used smokeless tobacco. She reports that she does not drink alcohol and does not use drugs. family history includes Diabetes in her mother; Heart disease in her maternal uncle; Hypertension in her father and mother; Lung cancer in her maternal grandfather; Prostate cancer in her maternal uncle; Stomach cancer in her maternal uncle and paternal grandmother; Thyroid disease in  her maternal uncle. Allergies  Allergen Reactions   Multihance [Gadobenate] Nausea And Vomiting   Collagenase Hives   Collagenase Clostridium Histolyticum Hives   Aspirin Rash    Review of Systems  Constitutional:  Negative for malaise/fatigue.  Eyes:  Negative for blurred vision.  Respiratory:  Negative for wheezing.   Cardiovascular:  Negative for chest pain.  Neurological:  Negative for dizziness, weakness and headaches.      Objective:     BP 126/80 (BP Location: Left Arm, Patient Position: Sitting, Cuff Size: Normal)   Pulse (!) 110   Temp 98 F (36.7 C) (Oral)   Ht 5\' 4"  (1.626 m)   Wt 273 lb 1.6 oz (123.9 kg)   LMP  07/11/2023   SpO2 95%   BMI 46.88 kg/m  BP Readings from Last 3 Encounters:  07/30/23 126/80  05/16/23 (!) 140/80  04/29/23 136/78   Wt Readings from Last 3 Encounters:  07/30/23 273 lb 1.6 oz (123.9 kg)  04/29/23 274 lb (124.3 kg)  04/23/23 279 lb 8 oz (126.8 kg)      Physical Exam Vitals reviewed.  Constitutional:      General: She is not in acute distress.    Appearance: She is well-developed.  Eyes:     Pupils: Pupils are equal, round, and reactive to light.  Neck:     Thyroid: No thyromegaly.     Vascular: No JVD.  Cardiovascular:     Rate and Rhythm: Normal rate and regular rhythm.     Heart sounds:     No gallop.  Pulmonary:     Effort: Pulmonary effort is normal. No respiratory distress.     Breath sounds: Normal breath sounds. No wheezing or rales.  Musculoskeletal:     Cervical back: Neck supple.  Neurological:     Mental Status: She is alert.      No results found for any visits on 07/30/23.  Last CBC Lab Results  Component Value Date   WBC 9.7 09/24/2022   HGB 9.9 (L) 01/20/2023   HCT 32.9 (L) 01/20/2023   MCV 63.1 Repeated and verified X2. (L) 09/24/2022   MCH 20.2 (L) 09/29/2017   RDW 20.1 (H) 09/24/2022   PLT 443.0 (H) 09/24/2022   Last metabolic panel Lab Results  Component Value Date   GLUCOSE 117 (H) 06/05/2023   NA 140 06/05/2023   K 3.6 06/05/2023   CL 100 06/05/2023   CO2 29 06/05/2023   BUN 27 (H) 06/05/2023   CREATININE 1.40 (H) 06/05/2023   GFR 45.44 (L) 06/05/2023   CALCIUM 9.4 06/05/2023   PHOS 3.7 09/24/2022   PROT 7.4 09/24/2022   ALBUMIN 3.6 09/24/2022   BILITOT 0.3 09/24/2022   ALKPHOS 133 (H) 09/24/2022   AST 16 09/24/2022   ALT 17 09/24/2022   ANIONGAP 8 12/26/2015   Last lipids Lab Results  Component Value Date   CHOL 203 (H) 09/29/2017   HDL 46 (L) 09/29/2017   LDLCALC 137 (H) 09/29/2017   TRIG 101 09/29/2017   CHOLHDL 4.4 09/29/2017   Last thyroid functions Lab Results  Component Value Date   TSH  1.86 09/29/2017      The ASCVD Risk score (Arnett DK, et al., 2019) failed to calculate for the following reasons:   Cannot find a previous HDL lab   Cannot find a previous total cholesterol lab    Assessment & Plan:   #1 hyperglycemia by multiple recent labs.  Patient is on prednisone 7.5 mg daily  and has positive family history of type 2 diabetes but no personal diagnosis.  Check A1c today= 6.5.  We discussed options of medication versus lifestyle management and she prefers the latter and recheck A1c in 3 months.  We strongly encouraged her to try to lose some weight and step up her regular exercise.  Offered follow-up with CDE and she declines at this time.  #2 hypertension stable and well-controlled on HCTZ and amlodipine.   Continue low-sodium diet and work on weight loss  #3 chronic kidney disease.  Recent GFR 45.  Avoid all nonsteroidals.  Stay well-hydrated.  Check basic metabolic panel at follow-up in 3 months  Evelena Peat, MD

## 2023-07-30 NOTE — Patient Instructions (Signed)
A1C today 6.5% which is prediabetes range.

## 2023-08-03 ENCOUNTER — Other Ambulatory Visit: Payer: Self-pay | Admitting: Family Medicine

## 2023-08-03 DIAGNOSIS — Z3009 Encounter for other general counseling and advice on contraception: Secondary | ICD-10-CM

## 2023-08-05 ENCOUNTER — Encounter: Payer: Self-pay | Admitting: Internal Medicine

## 2023-08-07 ENCOUNTER — Telehealth: Payer: Self-pay

## 2023-08-07 NOTE — Telephone Encounter (Signed)
Left message on My Chart with normal results per Dr. Krasowski's note. Routed to PCP. 

## 2023-08-18 ENCOUNTER — Other Ambulatory Visit: Payer: Self-pay | Admitting: Family Medicine

## 2023-08-23 ENCOUNTER — Other Ambulatory Visit: Payer: Self-pay | Admitting: Family Medicine

## 2023-09-05 ENCOUNTER — Encounter: Payer: Self-pay | Admitting: Family Medicine

## 2023-10-08 ENCOUNTER — Encounter: Payer: Self-pay | Admitting: Internal Medicine

## 2023-10-08 ENCOUNTER — Other Ambulatory Visit: Payer: Self-pay | Admitting: Internal Medicine

## 2023-10-19 ENCOUNTER — Other Ambulatory Visit: Payer: Self-pay | Admitting: Family Medicine

## 2023-10-20 ENCOUNTER — Encounter: Payer: Self-pay | Admitting: Family Medicine

## 2023-10-22 MED ORDER — ALBUTEROL SULFATE HFA 108 (90 BASE) MCG/ACT IN AERS: 2.0000 | INHALATION_SPRAY | Freq: Four times a day (QID) | RESPIRATORY_TRACT | 2 refills | Status: AC | PRN

## 2023-10-24 ENCOUNTER — Telehealth: Payer: Self-pay | Admitting: Pharmacy Technician

## 2023-10-24 ENCOUNTER — Other Ambulatory Visit (HOSPITAL_COMMUNITY): Payer: Self-pay

## 2023-10-24 NOTE — Telephone Encounter (Signed)
 Pharmacy Patient Advocate Encounter   Received notification from CoverMyMeds that prior authorization for Benzonatate  100MG  capsules is required/requested.   Insurance verification completed.   The patient is insured through [facility] .   Per test claim: PA required; PA submitted to above mentioned insurance via CoverMyMeds Key/confirmation #/EOC [facility] Status is pending

## 2023-10-25 ENCOUNTER — Other Ambulatory Visit: Payer: Self-pay | Admitting: Cardiology

## 2023-10-27 ENCOUNTER — Other Ambulatory Visit: Payer: Self-pay | Admitting: Family Medicine

## 2023-10-27 ENCOUNTER — Other Ambulatory Visit (HOSPITAL_COMMUNITY): Payer: Self-pay

## 2023-10-27 DIAGNOSIS — Z3009 Encounter for other general counseling and advice on contraception: Secondary | ICD-10-CM

## 2023-10-27 NOTE — Telephone Encounter (Signed)
 Prescription sent to pharmacy.

## 2023-10-27 NOTE — Telephone Encounter (Signed)
 Pharmacy Patient Advocate Encounter  Received notification from [facility] that Prior Authorization for Benzonatate  100MG  capsules  has been DENIED.  PLAN/BENEFIT EXCLUSION.   PA #/Case ID/Reference #: [id number]    **This medication is currently discounted at all [facility] pharmacy's for $10.**

## 2023-10-27 NOTE — Telephone Encounter (Signed)
 Noted.

## 2023-10-28 ENCOUNTER — Encounter: Payer: Self-pay | Admitting: Family Medicine

## 2023-10-28 DIAGNOSIS — Z3009 Encounter for other general counseling and advice on contraception: Secondary | ICD-10-CM

## 2023-10-28 MED ORDER — NORGESTIMATE-ETH ESTRADIOL 0.18/0.215/0.25 MG-25 MCG PO TABS: 1.0000 | ORAL_TABLET | Freq: Every day | ORAL | 2 refills | Status: DC

## 2023-10-29 ENCOUNTER — Ambulatory Visit: Payer: Managed Care, Other (non HMO) | Admitting: Family Medicine

## 2023-11-06 ENCOUNTER — Encounter: Payer: Self-pay | Admitting: Internal Medicine

## 2023-11-06 ENCOUNTER — Other Ambulatory Visit: Payer: Self-pay | Admitting: Family Medicine

## 2023-11-06 NOTE — Progress Notes (Unsigned)
OV 06/19/2020  Subjective:  Patient ID: Ashley Henry, female , DOB: [birth year] , age 48 y.o. , MRN: [id number] , ADDRESS: [address] [place] [address]  PCP Kristian Covey, MD Attending : Treatment Team:  Attending Provider: Kalman Shan, MD    06/19/2020 -   Chief Complaint  Patient presents with   Consult    Patient is here for a productive/dry cough that she has had for several months with brown/yellow thivk sputum. Shortness of breath with exertion, stong smells, to hot.      HPI Ashley Henry 48 y.o. -referred by primary care physician Burchette, Elberta Fortis, MD.  Patient is a psychotherapist.  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  Her 3 maternal uncles, her brother and her maternal aunt all have sarcoidosis.  Her uncles are deceased although from other reasons.  Her brother and her aunt are still living.  She tells me that in the last 6 months he has had insidious onset of chronic cough, shortness of breath, night sweats and chills.  These have persisted.  This is despite having an intentional 8 pound weight loss during this time.  She says cough is made worse by talking or exertion.  Similarly shortness of breath is made worse by exertion.  Also made worse by talking through the mask during counseling sessions.  Both are relieved by rest.  Cough is also relieved by not talking.  She does have nocturnal awakening with cough chest tightness and wheezing.  She also reports right infra axillary right upper quadrant pain with coughing.  She does feel chest tightness deep inside the chest.  She had CT scan of the chest that I personally visualized.  Shows mediastinal lymphadenopathy consistent with stage I sarcoidosis.  CT scan abdomen cuts on the chest CT show possible evidence of sarcoidosis in the liver.  MRI has been recommended.  There are associated palpitations and night sweats.  Cough is associated with sputum productin     IMPRESSION:  1.  Pulmonary parenchymal and thoracic nodal findings which are consistent with sarcoidosis. 2. Heterogeneous density throughout the liver. Possibly heterogeneous steatosis. Hepatic involvement of sarcoidosis could look similar. If there are right upper quadrant symptoms or abnormal liver function test, pre and post contrast abdominal MRI should be considered.     Electronically Signed   By: Jeronimo Greaves M.D.   On: 05/29/2020 10:59  ROS - per HPI  Results for Ashley Henry, Ashley Henry (MRN [id number]) as of 06/19/2020 09:03  Ref. Range 09/26/2017 14:27 10/13/2017 16:54 10/06/2018 14:47  Anti Nuclear Antibody (ANA) Latest Ref Range: NEGATIVE   NEGATIVE   SSA (Ro) (ENA) Antibody, IgG Latest Ref Range: <1.0 NEG AI <1.0 NEG    SSB (La) (ENA) Antibody, IgG Latest Ref Range: <1.0 NEG AI <1.0 NEG       07/13/2020 Follow up : Cough/? Sarcoid /Abnormal CT chest and MRI AB   48 year old female never smoker seen for pulmonary consult June 19, 2020 for 93-month history of chronic cough shortness of breath night sweats and abnormal CT chest suspicious for sarcoidosis.  Patient has a very strong family history of sarcoidosis.  Patient presents for a 3-week follow-up.  Patient was seen last visit for pulmonary consult for a 59-month history of cough shortness of breath night sweats and chills.  She was seen by her primary care provider.  Subsequent CT chest May 29, 2020 showed pulmonary nodularity, possible bilateral bilateral hilar adenopathy,  liver abnormalities.  This was suspicious for possible sarcoidosis.  Lab work last visit showed an elevated ACE level at 82 and a high sed rate at greater than 130.  Her rheumatoid factor was positive but CCP was negative.  INR was slightly elevated at 1.1.  Patient anemia was stable with hemoglobin at 9.3.  Her platelets were normal.  Hepatic function testing was normal.  Patient was set up for an MRI abdomen that showed abnormalities with a hyperenhancing area in the medial  segment of the left hepatic lobe hepatic nodularity.  And splenic lesions.  There was suspicion for portal hypertension with possible varices versus a distal esophageal hyperenhancement.  There was no signs of steatosis. Patient has significant fatigue low energy decreased activity tolerance cough joint pains.  She has had intermittent rashes but has none currently.  She has had a recent eye exam with no reported abnormalities. We discussed all of her test and lab results. Has very strong family history of sarcoidosis with 3 maternal uncles, her brother and a maternal aunt that all have sarcoidosis. Patient has had intentional weight loss says that she has been on a diet and is down greater than 89 pounds. She denies any hemoptysis bloody stools hematemesis, bruising.  We had a long discussion regarding her abnormalities in her lab work and recent MRI of her liver and spleen.  We discussed the need to proceed with probable bronchoscopy with biopsy and referral to GI for consideration for further work-up of liver involvement and abnormalities. Unfortunately patient's medical insurance is going to stop July 16, 2020.  She declines flu shot and Covid vaccines  Patient works independently as a Airline pilot.    TEST/EVENTS :  Chest x-ray September 29, 2017 clear lungs no acute process  Renal CT April 2017-no focal abnormalities noted in the liver, spleen or pancreas, nonobstructive left nephrolithiasis, moderate right hydroureter nephrosis  CT chest without contrast May 29, 2020 showed perilymphatic, peribronchovascular and subpleural distribution of pulmonary nodularity throughout right apical nodule 5 mm and nodularity along the left major fissure measuring 9 mm.  Heterogeneous density throughout the liver.,  Possible mild bilateral hilar adenopathy  MR abdomen July 08, 2020 hyperenhancing area in the medial segment of the left hepatic lobe, background of nodular hepatic contours and  hepatic heterogeneity atypical for sarcoid associated lesion given arterial enhancement other areas more typical for sarcoid but remain nonspecific.,  Splenic lesions noted.  Potential portal hypertension with varices versus distal esophageal hyperenhancement, no signs of steatosis  Labs June 19, 2020: QuantiFERON gold negative, Sjogren's negative, LFTs normal, rheumatoid factor positive, CCP negative, INR 1.1, allergy profile negative, IgE 67, Anti-DNA antibody negative, hepatic panel normal, platelets normal, WBC 7.1 ACE level elevated 82, sed rate greater than 130, vitamin D 8, hemoglobin 9.3 hematocrit 28  2D echo July 07, 2020 EF 55 to 60%, mild LVH, pulmonary artery pressure normal  Previous iron levels were noted low  HIV 2019 neg.   OV 11/22/2021  Subjective:  Patient ID: Ashley Henry, female , DOB: [birth year] , age 89 y.o. , MRN: [id number] , ADDRESS: [address] [place] [address] PCP Kristian Covey, MD Patient Care Team: Kristian Covey, MD as PCP - General  This Provider for this visit: Treatment Team:  Attending Provider: Kalman Shan, MD    11/22/2021 -   Chief Complaint  Patient presents with   Follow-up    Pt states she is coughing a lot each day and also states  that her skin has been breaking out. States first thing in the morning she will cough up a lot of phlegm. States that she does have increased SOB especially when exercising.     ICD-10-CM   1. Sarcoidosis  D86.9     2. Chronic cough  R05.3     3. Dyspnea on exertion  R06.09     4. Mediastinal adenopathy  R59.0     5. Vitamin D deficiency  E55.9     6. Anemia, unspecified type  D64.9     7. Rheumatoid factor positive  R76.8     8. Abnormal CT of liver  R93.2     9. Pigmented skin lesions  L81.9        HPI Ashley Henry 48 y.o. -returns for follow-up.  Last seen personally in 2021.  At that time the concern was all his symptoms are pointing toward sarcoidosis.  She  then saw a nurse practitioner Rikki Spearing in October 2021.  She was confirmed to have sarcoidosis clinically based on high angiotensin-converting enzyme, African-American ethnicity, age and symptoms and CT scan findings of the lung.  She was then prescribed prednisone for 3 months she says during this time all the symptoms resolved.  She did not have insurance so did not do any further work-up.  She says then early last year she ran out of prednisone and since then having significant symptoms.  The symptoms include significant amount of night sweats, shortness of breath and wheezing.  Also severe cough day and night.  In the early morning the cough is productive with yellow sputum but rest of the day the cough is dry.  She works as a Airline pilot and as she talks the cough is even worse.  She showed a voice recording of the cough and there is clear laryngeal component to the cough.  Symptoms are rated as severe.  Cough not responding to Tessalon cough.  During this time she had skin lesions and arthralgia.  The skin lesions on the hands forehead.  There is also drying of the palms.  There is arthralgia diffusely.  Vitamin D deficiency: This was diagnosed in 2021.  Not on treatment currently  Anemia: This was diagnosed in 2021.  Not on treatment currently   Liver lesion seen on MRI along with splenic lesions: These were reported as atypical for sarcoid.  She never saw a GI  Skin lesions: This is new since last visit.  She is open to seeing dermatologist.      OV 12/18/2021  Subjective:  Patient ID: Ashley Henry, female , DOB: 1975-10-23 , age 65 y.o. , MRN: [id number] , ADDRESS: [address] [place] [address] PCP Kristian Covey, MD Patient Care Team: Kristian Covey, MD as PCP - General    12/18/2021 -this video visit is to review test results done due to concern of sarcoidosis.   HPI Ashley Henry 48 y.o. -since her last visit a few weeks ago she continues to have  the same symptoms of arthralgia and shortness of breath.  And skin lesions.  Referred her to the dermatologist and GI because of concerns of splenic lesions.  She has not yet seen them.  At this point in time she is focused on getting through symptom relief.  We did do investigations and they are as follows  CT scan of the chest: Shows worsening sarcoidosis.  I visualized the film and also showed it to the video  Pulmonary  function test: Not yet done  Blood work: Shows worsening angiotensin-converting enzyme level to greater than 180.  Persistently high ESR.Marland Kitchen  Rheumatoid factor continues to be trace positive.  Continues to be iron deficient anemic with a hemoglobin 9.9 g% which is baseline since 2014.  She says primary care physician has not yet addressed this.  IMPRESSION: 1. There has been dramatic increase in parenchymal lung involvement with extensive progressive perilymphatic nodularity, as detailed above, compatible with worsening sarcoidosis. 2. Multiple large low-attenuation splenic lesions, nonspecific, but likely reflective of sarcoid involvement in the spleen. 3. Dilatation of the pulmonic trunk (3.7 cm in diameter), concerning for pulmonary arterial hypertension. 4. Aortic atherosclerosis.   Aortic Atherosclerosis (ICD10-I70.0).     Electronically Signed   By: Trudie Reed M.D.   On: 12/12/2021 07:41   01/28/2022 -   Chief Complaint  Patient presents with   Follow-up    PFT performed 01/25/22. Pt states she has been doing okay since last visit and denies any complaints.     HPI Ashley Henry 48 y.o. -last office visit April 2023 on video.  Presents with her daughter who is at the bedside.  Diagnosis worsening sarcoidosis symptomatic given.  Commenced on prednisone.  I wrote for prednisone taper of 40 mg x 2 weeks and then 20 mg x 2 weeks.  The pharmacist informed that I skipped a dose of 30 mg for 2 weeks.  Therefore she took 40 mg for 2 weeks and then 30 mg for 2  weeks.  She is currently on 20 mg daily and she is finished 1 week of that.  She wants to extend the taper and go to 10 mg/day and then continue that per instructions.  The prednisone is really helped shortness of breath is improved cough is improved.  Her skin rash in the face and hands have improved.  Nevertheless she is having prednisone side effects of increased hunger 10 pound weight gain decreased sleep increased alertness.  Also facial puffiness.  Nevertheless she is continuing with the prednisone and she is okay doing that.  We did get an echocardiogram.  Previous echocardiogram 2021 was normal.  Currently is reporting moderate to severe mitral regurgitation and rater than 4 cm aortic root dilatation.  I did inform her of these findings.  I also passed a message on to Dr. Dulce Sellar her cardiologist.  Pulmonary function shows mild restriction with mild reduction in diffusion capacity.    OV 05/14/2022  Subjective:  Patient ID: Ashley Henry, female , DOB: Jan 19, 1976 , age 77 y.o. , MRN: [id number] , ADDRESS: [address] [place] [address] PCP Kristian Covey, MD Patient Care Team: Kristian Covey, MD as PCP - General  This Provider for this visit: Treatment Team:  Attending Provider: Kalman Shan, MD    05/14/2022 -   Chief Complaint  Patient presents with   Follow-up    PFT performed today.  Pt states she is about the same compared to last visit.   Follow-up pulmonary sarcoidosis last CT scan March 2023  HPI Ashley Henry 48 y.o. -returns for follow-up.  At the last visit her symptoms and the skin lesions were better after prednisone dosage.  We started tapering the prednisone.  Today she presents for follow-up.  She is now on 10 mg/day prednisone but she says with the taper cough is worse shortness of breath is worse wheezing is worse.  In addition skin lesions are also getting worse.  She is  still gaining weight though she states she is gained another 10  pounds of weight on the current 10 mg of prednisone.  She is frustrated by the worsening symptoms.  She did a pulmonary function test today.  This shows worsening of FVC and FEV1 however she did have significant amount of cough which is part of her symptoms from the sarcoid.  Therefore the results are somewhat unreliable.  Her daughter is here with her today.  She had normal G6PD testing are slightly high back in May 2023 March 2023 the QuantiFERON gold was negative. She has iron deficiency anemia when last checked in March 2023  She has mitral valve regurgitation and she saw Dr. Dulce Sellar and a TEE is pending very shortly.  She does admit to history of palpitations.  She does not recall having MRI of the heart or some kind of arrhythmia monitoring program.  I have written to Dr. Dulce Sellar about this.  We discussed the fact that she has progressive pulm sarcoidosis symptoms and the prednisone is causing side effects and also in addition with the prednisone taper she is having more symptoms.  Last visit we discussed the second line methotrexate therapy.  She is open to the idea.  We went over the side effects in detail including common and rare side effects and the requirement for significant amount of blood monitoring.  She is agreeable to this.  We discussed referral to [facility] or [facility] for clinical trial participation by [employer] but she is not interested.  We discussed about seeing Dr. Mechele Collin in our practice resident sarcoid expert but at this point in time she wants to hold off.       OV 08/15/2022  Subjective:  Patient ID: Ashley Henry, female , DOB: [birth year] , age 54 y.o. , MRN: [id number] , ADDRESS: [address] [place] [address] PCP Kristian Covey, MD Patient Care Team: Kristian Covey, MD as PCP - General  This Provider for this visit: Treatment Team:  Attending Provider: Kalman Shan, MD  Follow-up pulmonary sarcoidosis last CT scan March  2023  08/15/2022 -   Chief Complaint  Patient presents with   Follow-up    Doing good with prednisone currently     HPI Ashley Henry 48 y.o. -returns for follow-up.  At this point in time she is back on prednisone 10 mg/day.  She finds this dose to be helpful for her.  Early in the morning she has severe cough that last 30 to 60 minutes and it has white sputum.  Then she takes a prednisone 10 mg/day and the cough goes away.  Overall quality of life is better no shortness of breath rest of the day there is no cough.  Her skin lesions in the face have resolved.  The skin lesions in the hands do persist but they are improved.  She is open to getting a dermatology referral right now.  She did have mitral regurgitation on the 2D echo when she had a TEE and it is only mild.  This was in August 2023 and I reviewed that result.  At this point in time she is content taking prednisone 10 mg/day for her sarcoid.  She does not want methotrexate.  In the past she declined to clinical trials.  Review of the labs indicate that she had anemia in March 2023.  I will will offer her repeat CBC testing.  We discussed vaccination she has had a COVID-vaccine but not  her flu.  She has never had a flu shot and does not intend to.     OV 09/24/2022  Subjective:  Patient ID: Ashley Henry, female , DOB: November 30, 1975 , age 69 y.o. , MRN: [id number] , ADDRESS: [address] [place] [address] PCP Kristian Covey, MD Patient Care Team: Kristian Covey, MD as PCP - General  This Provider for this visit: Treatment Team:  Attending Provider: Kalman Shan, MD    09/24/2022 -   Chief Complaint  Patient presents with   Follow-up    Breathing has been good,winded with walking.      HPI Ashley Henry 48 y.o. -returns for follow-up with her daughter.  She is giving the history.  She tells me since last visit she has had 2 trips to [place] 1 time in November 2023 and the other 1 just this past  weekend.  She is having insidious onset of dyspnea on exertion relieved by rest.  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  Is been going on for 3 or 4 weeks relieved by rest.  She is also reporting bilateral ankle swelling around the same time.  Particularly with sitting.  She went [place] by car and it was definitely present.  Her baseline cough early in the morning for 30 minutes is unchanged.  This cough is made worse by perfume and smoke but now also present with the new onset of dyspnea.  She does have very streaky mild hemoptysis and thick yellow sputum but this is baseline.  Her hemoglobin was last checked a year ago and she was anemic.  I reviewed this result again.  She is wondering about getting a repeat CT scan of the chest to reassess her sarcoid.  She had a TEE in summer 2023 and she only had mild mitral valve prolapse.  I reviewed this result again.     OV 12/06/2022  Subjective:  Patient ID: Ashley Henry, female , DOB: February 18, 1976 , age 28 y.o. , MRN: [id number] , ADDRESS: [address] [place] [address] PCP Kristian Covey, MD Patient Care Team: Kristian Covey, MD as PCP - General  This Provider for this visit: Treatment Team:  Attending Provider: Kalman Shan, MD    12/06/2022 -   Chief Complaint  Patient presents with   Follow-up    Sarcoidosis f/up     HPI Ashley Henry 48 y.o. -returns for follow-up.  She has not had a pulmonary function test today.  She states that she is on 10 mg prednisone taking it compliant.  Shortness of breath is improved although there is residual shortness of breath.  Ankle edema from previous visit is also resolved.  She is walking and this is helping her.  Her skin lesions are also improved.  However there are side effects from the prednisone her hemoglobin A1c in January 2024 is gone up to greater than 7.  Her blood pressure today is elevated 180/120.  This the  first time it has been elevated.  She is not on antihypertensives.  Iron deficiency anemia continues.  She states she is not taking iron pills because she does not want to.  She is also got diminished sleep 25% of the time.  She is open to reducing her prednisone to 7.5 mg/day.  Her cardiologist Dr. Dulce Sellar is retiring.  She is upcoming cardiology appointment with a new cardiologist in  May 2024.  Otherwise no new issues.         OV 02/20/2023  Subjective:  Patient ID: Ashley Henry, female , DOB: 03-18-1976 , age 62 y.o. , MRN: [id number] , ADDRESS: [address] [place] [address] PCP Kristian Covey, MD Patient Care Team: Kristian Covey, MD as PCP - General  This Provider for this visit: Treatment Team:  Attending Provider: Kalman Shan, MD    02/20/2023 -   Chief Complaint  Patient presents with   Follow-up    SOB with exertion.  Cough and wheeze persistent.     HPI Ashley Henry 48 y.o. -follow-up pulmonary sarcoidosis on prednisone  Returns for follow-up.  Presents with her daughter Moyinoluwa Dawe.  She is now on a reduced dose of prednisone 7.5 mg/day.  So far there is no recurrence of rash.  In the past reducing prednisone is caused flareup of sarcoid.  However when prednisone is increased her blood pressure is high and her blood sugar goes up.  Most recent hemoglobin A1c 7.2.  She not on any treatment for this.  She ideally wants to come off the prednisone because this is prednisone related hyperglycemia.  She is agreed to drop her prednisone to 5 mg/day.  She is gotten hypertensive and since last visit she is on hydrochlorothiazide and also Cozaar.  Her blood pressure has improved with a systolic of 132 today.  From a respiratory standpoint her pulmonary function test is stable compared to August 2023 but is still down compared to 1 year ago.  If this is very similar at 1.98 L / 54% and DLCO of 12.47/58%.  We talked about steroid sparing options.  She is  quite interested at this time and infliximab.  We went over methotrexate again.  She is more fearful of methotrexate even though this is less immunosuppressive.  This because of the side effect profile and personal experience she has heard from other people.  We went over TNF alpha blockade infliximab.  Went over the side effect profile on up-to-date that includes infusion reactions abdominal symptoms headaches and also long-term risk for opportunistic infections.  She is interested in this because the schedule convenience and other reasons.  She wants to meet with the pharmacist about this.  She wants to go ahead.  She is particular interested in the steroid sparing effects of this.    In terms of other issues - She has new cardiologist Dr Bing Matter cardiac MRI was recommended with this being changed to PET scan because of dye allergy.  She has had a ZIO monitor but the results are pending.  Reviewed the external records on this.   OV 05/16/2023  Subjective:  Patient ID: Ashley Henry, female , DOB: Mar 04, 1976 , age 74 y.o. , MRN: [id number] , ADDRESS: [address] [place] [address] PCP Kristian Covey, MD Patient Care Team: Kristian Covey, MD as PCP - General  This Provider for this visit: Treatment Team:  Attending Provider: Kalman Shan, MD    05/16/2023 -   Chief Complaint  Patient presents with   Follow-up    F/up on sarcoidosis     HPI Ashley Henry 48 y.o. -returns for follow-up of her sarcoidosis.  After the last visit started process for TNF alpha blockade.  However pharmacy has not been able to get in touch with her.  She tells me that she is not interested in infliximab because of the side effect profile.  Therefore she does not want to do it.  In the interim she did see primary care on 04/23/2023 because of boyfriend had positive QuantiFERON gold test in March 2023.  Was checked again by the primary care on 04/23/2023 and again negative.  She did see  cardiology on 04/29/2023.  Appears she did have a TEE and the mitral regurgitation is only mild.  Pro BNP was checked and this was normal.  Cardiology is working towards getting a PET scan in the presence of allergy to MRI dye.  She told me this is scheduled next week.  The main issue is that week or 2 after last visit she picked up her skin lesion on her right dorsum that was very suspicious for sarcoid.  She did see dermatology.  Fluticasone did not resolve it so she went up on the prednisone to 20 mg/day and then this resolved it.  This happened approximately a month ago when she was taking 20 mg/day.  For the last 1 week she is on back on 5 mg baseline.  Currently she just has mild cough and she is able to tolerate it.  She is not interested in methotrexate or clinical trials or infliximab.  Overall she deems this all stable.       OV 11/07/2023  Subjective:  Patient ID: Ashley Henry, female , DOB: 1975/10/08 , age 71 y.o. , MRN: [id number] , ADDRESS: [address] [place] [address] PCP Kristian Covey, MD Patient Care Team: Kristian Covey, MD as PCP - General  This Provider for this visit: Treatment Team:  Attending Provider: Kalman Shan, MD  Type of visit: Video Virtual Visit Identification of patient Ashley Henry with 07-15-1976 and MRN [id number] - 2 person identifier Risks: Risks, benefits, limitations of telephone visit explained. Patient understood and verbalized agreement to proceed Anyone else on call: just patient Patient location: her home This provider location: [address], [address]; [place]; [place] [address]. [facility]. [phone]   11/07/2023 -  FU pulmonary sarciid   HPI Ashley Henry 48 y.o.  - remai pn pred 5mg  per day  -skin lesion hand, figner, forehead.x months  x new since last visit in aug 2024. Somewhat prominent x 3 months. Itchy => has seen derm. Was given steroid ointment (oct 2024) has not folowe dup.  She  does not want to go back to [facility] dermatology.  Will make a referral to [facility] dermatology.  Or [facility].  -  Also cough intermittenly - mild, SAme. No change. Worse ith lot of talking and early AM. Sometime in morning. Reduced by vicks vapr . Alb helps  - DOE - due to mask and going to get her patient. Same no change   CARDIAC PET 07/16/23 - persnally visualized this > and showed it to her and compared to 2023 spring.  The pulmonary infiltrates might be slightly better but definitely not worse.  Obviously it is different type of imaging.  arrative & Impression      FDG uptake was not observed. LV perfusion is normal. There is no evidence of infarction.   Left ventricular function is normal. EF: 57%. End diastolic cavity size is normal.   Coronary calcium was absent on the attenuation correction CT images.   FDG uptake findings are inconsistent with active myocardial inflammation/sarcoidosis.   Electronically signed by Lennie Odor, MD ___________________________________________________________________________________________________________   CLINICAL DATA:  This over-read does not include interpretation of cardiac  or coronary anatomy or pathology. The cardiac PET-CT interpretation by the cardiologist is attached.   COMPARISON:  None Available.   FINDINGS: Diffuse reticulonodular interstitial thickening is seen in the visualized portions of both lungs with predominance in the upper lung fields. This is consistent with an atypical inflammatory or infectious process, and differential diagnosis includes pulmonary by sarcoidosis. No pleural fluid seen.   The visualized portions of the mediastinum and chest wall are unremarkable.   IMPRESSION: Diffuse reticulonodular interstitial thickening in both lungs, consistent with an atypical inflammatory or infectious process. Differential diagnosis includes pulmonary sarcoidosis.     Electronically Signed   By:  Danae Orleans M.D.   On: 07/16/2023 11:08        PFT     Latest Ref Rng & Units 02/20/2023    3:38 PM 05/14/2022    3:33 PM 01/25/2022   12:10 PM  PFT Results  FVC-Pre L 1.98  1.95  2.30   FVC-Predicted Pre % 54  53  76   FVC-Post L   2.16   FVC-Predicted Post %   71   Pre FEV1/FVC % % 56  63  88   Post FEV1/FCV % %   85   FEV1-Pre L 1.11  1.22  2.02   FEV1-Predicted Pre % 37  41  82   FEV1-Post L   1.83   DLCO uncorrected ml/min/mmHg 12.47   15.20   DLCO UNC% % 58   70   DLCO corrected ml/min/mmHg 12.47   15.20   DLCO COR %Predicted % 58   70   DLVA Predicted % 116   122   TLC L   3.17   TLC % Predicted %   62   RV % Predicted %   54        LAB RESULTS last 96 hours No results found.       has a past medical history of Abnormal CT scan of lung (07/13/2020), Abnormal finding on imaging of liver (07/13/2020), Anemia, BACK PAIN, UPPER (07/27/2010), BRONCHITIS, ACUTE (07/16/2010), CELLULITIS, GREAT TOE (01/08/2010), Enlarged uterus (05/13/2011), Essential hypertension (09/07/2009), HEADACHE (08/03/2010), HYPERLIPIDEMIA (10/11/2009), Hypertension (2010), INGROWN NAIL (02/08/2010), Iron deficiency anemia due to chronic blood loss (06/20/2014), Kidney stone (10/30/2015), OBESITY (10/11/2009), PARESTHESIA (08/03/2010), Pre-diabetes, Renal disorder, SINUSITIS, ACUTE (12/07/2009), SORE THROAT (02/14/2010), and Vitamin D deficiency (07/13/2020).   reports that she has never smoked. She has never used smokeless tobacco.  Past Surgical History:  Procedure Laterality Date   BREAST REDUCTION SURGERY  1999   BUBBLE STUDY  05/16/2022   Procedure: BUBBLE STUDY;  Surgeon: Pricilla Riffle, MD;  Location: [facility] ENDOSCOPY;  Service: Cardiovascular;;   CESAREAN SECTION  2004   Cysts removed from chest wall     EYE SURGERY Left    MASS EXCISION N/A 04/14/2018   Procedure: EXCISION OF CHEST WALL MASS;  Surgeon: Abigail Miyamoto, MD;  Location: [facility];  Service: General;  Laterality:  N/A;   TEE WITHOUT CARDIOVERSION N/A 05/16/2022   Procedure: TRANSESOPHAGEAL ECHOCARDIOGRAM (TEE);  Surgeon: Pricilla Riffle, MD;  Location: [facility] ENDOSCOPY;  Service: Cardiovascular;  Laterality: N/A;    Allergies  Allergen Reactions   Multihance [Gadobenate] Nausea And Vomiting   Collagenase Hives   Collagenase Clostridium Histolyticum Hives   Aspirin Rash    Immunization History  Administered Date(s) Administered   PFIZER(Purple Top)SARS-COV-2 Vaccination 12/30/2020, 01/20/2021, 06/30/2021   PPD Test 03/29/2020   Td 09/04/2009   Td (Adult),5 Lf Tetanus Toxid, Preservative  Free 09/04/2009   Tdap 09/04/2009    Family History  Problem Relation Age of Onset   Hypertension Mother    Diabetes Mother    Hypertension Father    Stomach cancer Paternal Grandmother    Stomach cancer Maternal Uncle    Heart disease Maternal Uncle    Lung cancer Maternal Grandfather    Prostate cancer Maternal Uncle    Thyroid disease Maternal Uncle      Current Outpatient Medications:    budesonide-formoterol (SYMBICORT) 80-4.5 MCG/ACT inhaler, Inhale 2 puffs into the lungs 2 (two) times daily., Disp: 1 each, Rfl: 12   albuterol (PROVENTIL) (2.5 MG/3ML) 0.083% nebulizer solution, Take 3 mLs (2.5 mg total) by nebulization every 6 (six) hours as needed for wheezing or shortness of breath., Disp: 75 mL, Rfl: 5   albuterol (VENTOLIN HFA) 108 (90 Base) MCG/ACT inhaler, Inhale 2 puffs into the lungs every 6 (six) hours as needed for wheezing or shortness of breath., Disp: 18 g, Rfl: 2   amLODipine (NORVASC) 5 MG tablet, TAKE 1 TABLET(5 MG) BY MOUTH DAILY, Disp: 30 tablet, Rfl: 5   benzonatate (TESSALON) 100 MG capsule, Take 1 capsule every 8 hours as needed for cough, Disp: 60 capsule, Rfl: 2   BEPREVE 1.5 % SOLN, INSTILL 1 DROP IN BOTH EYES DAILY, Disp: 10 mL, Rfl: 5   cyclobenzaprine (FLEXERIL) 5 MG tablet, TAKE 1 TABLET BY MOUTH EVERY 8 HOURS AS NEEDED FOR MUSCLE SPASM, Disp: 60 tablet, Rfl: 2   ferrous  sulfate (SLOW IRON) 160 (50 Fe) MG TBCR SR tablet, Take 1 tablet (160 mg total) by mouth daily., Disp: 30 tablet, Rfl: 3   fluticasone (CUTIVATE) 0.05 % cream, Apply topically 2 (two) times daily. (Patient taking differently: Apply 1 Application topically 2 (two) times daily.), Disp: 15 g, Rfl: 0   hydrochlorothiazide (HYDRODIURIL) 25 MG tablet, TAKE 1 TABLET(25 MG) BY MOUTH DAILY, Disp: 30 tablet, Rfl: 3   hydrocortisone butyrate (LOCOID) 0.1 % CREA cream, Apply 1 Application topically 2 (two) times daily., Disp: 45 g, Rfl: 2   losartan (COZAAR) 25 MG tablet, TAKE 1 TABLET(25 MG) BY MOUTH DAILY, Disp: 90 tablet, Rfl: 1   Norgestimate-Eth Estradiol (TRI-LO-MILI) 0.18/0.215/0.25 MG-25 MCG TABS, Take 1 tablet by mouth daily., Disp: 28 tablet, Rfl: 2   Norgestimate-Ethinyl Estradiol Triphasic (TRI-LO-SPRINTEC) 0.18/0.215/0.25 MG-25 MCG tab, Take 1 tablet by mouth daily., Disp: 28 tablet, Rfl: 3   omeprazole (PRILOSEC) 40 MG capsule, TAKE 1 CAPSULE(40 MG) BY MOUTH DAILY, Disp: 90 capsule, Rfl: 0   predniSONE (DELTASONE) 5 MG tablet, Take 1 tablet (5 mg total) by mouth daily with breakfast., Disp: 90 tablet, Rfl: 1      Objective:   There were no vitals filed for this visit.  Estimated body mass index is 46.88 kg/m as calculated from the following:   Height as of 07/30/23: 5\' 4"  (1.626 m).   Weight as of 07/30/23: 273 lb 1.6 oz (123.9 kg).  @WEIGHTCHANGE @  There were no vitals filed for this visit.   Physical Exam   General: No distress. Looks well O2 at rest: no Cane present: no Sitting in wheel chair: no Frail: n Obese: yes Neuro: Alert and Oriented x 3. GCS 15. Speech normal Psych: Pleasant        Assessment:       ICD-10-CM   1. Sarcoidosis  D86.9 Ambulatory referral to Dermatology    CT Chest High Resolution    2. Chronic cough  R05.3 Ambulatory referral to Dermatology  CT Chest High Resolution    3. Dyspnea on exertion  R06.09 Ambulatory referral to Dermatology     CT Chest High Resolution    4. Current chronic use of systemic steroids  Z79.52 Ambulatory referral to Dermatology    CT Chest High Resolution    5. Pigmented skin lesions  L81.9 Ambulatory referral to Dermatology    CT Chest High Resolution         Plan:     Patient Instructions  Sarcoidosis Chronic cough Dyspnea on exertion Mediastinal adenopathy Current use of chronic steroids   - Shortness of breath and PFT stable on prednisone 5 mg per day.   Some mild cough - Noted and respect reluctance for TNF alpha blockade infliximab because of side effect concern. -PET scan suggestibility but also respect fact that he would like to have another scan done -Noted desire to improve cough with the help of inhaled steroids.   Plan -Continue prednisone 5 mg/day - Do high-resolution CT scan of the chest supine and prone in November 2025 - Try Symbicort empiric inhaler therapy schedule -Respect no inte -Not interested in methotrexate, or infliximab or clinical trials due to safety concern   HYPERTENSION 180/120 on 12/06/2022 and 132/90 on 02/20/2023 Elevated HgbA1c Jan 2024 and March 2024  - likely prednisone contributing but hemoglobin A1c recently 6.5  Plan -According to primary care physician   Anemia, unspecified type -noted in October 2021. Persistent on May 2024; hgb 9.9  Seems like Iron Deficiency anemia. Ongoing May 2024. Not taking iron pills per personal choice  Plan -monitor and support from primary care physician   Pigmented skin lesions  -Very suspicious of sarcoidosis.  Flared up in the right dorsum of the hand after recent visit June 2024 and again has flared up on this visit February 2025  Plan - Referred to [facility] dermatology  Rheumatoid factor positive -mildly positive in 2021 and persistent though March 2024 Associted arthralgia  - can be sarcoid arthritis versus RA but glad joint pain improved  and not much this visit 12/06/2022   Plan  -  monitor -  - at some point can decide on rheumatology referral   Abnormal CT of liver along with MRI of the liver and spleen in 2021 ad in CT March 2023   Plan - Refer to gastroenterology some time in future   HY   Follow-up - November 2025 face-to-face visit but after CT scan of the chest   FOLLOWUP Return for 15 min visit after CT scan of the chest.    SIGNATURE    Dr. Kalman Shan, M.D., F.C.C.P,  Pulmonary and Critical Care Medicine Staff Physician, [facility] Director - Interstitial Lung Disease  Program  Pulmonary Fibrosis [facility] at [employer] [place], [place], [address]  Pager: [phone], If no answer or between  15:00h - 7:00h: call [phone] Telephone: [phone]  1:17 PM 11/07/2023

## 2023-11-06 NOTE — Patient Instructions (Incomplete)
Sarcoidosis Chronic cough Dyspnea on exertion Mediastinal adenopathy Current use of chronic steroids   - Shortness of breath and PFT stable on prednisone 5 mg per day.   Some mild cough - Noted and respect reluctance for TNF alpha blockade infliximab because of side effect concern. -Noted that you are going to have your PET scan for cardiac sarcoid next week -Noted recent rash on the dorsum that resolved with higher dose prednisone but now is back on 5 mg   Plan -await PET scan by cardiology- continue prednisone  but drop to 5mg  per day -Respect no interest in methotrexate, or infliximab or clinical trials due to safety concern   HYPERTENSION 180/120 on 12/06/2022 and 132/90 on 02/20/2023 Elevated HgbA1c Jan 2024 and March 2024  - likely prednisone contributing  Plan -According to primary care physician   Anemia, unspecified type -noted in October 2021. Persistent on May 2024; hgb 9.9  Seems like Iron Deficiency anemia. Ongoing May 2024. Not taking iron pills per personal choice  Plan -monitor and support from primary care physician   Pigmented skin lesions  -Very suspicious of sarcoidosis.  Flared up in the right dorsum of the hand after recent visit June 2024  Plan - monitor for relapse with lower prednisone of 5mg  pre day  Rheumatoid factor positive -mildly positive in 2021 and persistent though March 2024 Associted arthralgia  - can be sarcoid arthritis versus RA but glad joint pain improved  and not much this visit 12/06/2022   Plan  - monitor -  - at some point can decide on rheumatology referral   Abnormal CT of liver along with MRI of the liver and spleen in 2021 ad in CT March 2023   Plan - Refer to gastroenterology some time in future   HY   Follow-up - 6 months  with Dr Marchelle Gearing -15-minute visit

## 2023-11-07 ENCOUNTER — Telehealth (INDEPENDENT_AMBULATORY_CARE_PROVIDER_SITE_OTHER): Payer: Self-pay | Admitting: Internal Medicine

## 2023-11-07 ENCOUNTER — Encounter: Payer: Self-pay | Admitting: Internal Medicine

## 2023-11-07 DIAGNOSIS — R053 Chronic cough: Secondary | ICD-10-CM

## 2023-11-07 DIAGNOSIS — R0609 Other forms of dyspnea: Secondary | ICD-10-CM

## 2023-11-07 DIAGNOSIS — D869 Sarcoidosis, unspecified: Secondary | ICD-10-CM

## 2023-11-07 DIAGNOSIS — Z7952 Long term (current) use of systemic steroids: Secondary | ICD-10-CM

## 2023-11-07 DIAGNOSIS — L819 Disorder of pigmentation, unspecified: Secondary | ICD-10-CM

## 2023-11-07 MED ORDER — BUDESONIDE-FORMOTEROL FUMARATE 80-4.5 MCG/ACT IN AERO: 2.0000 | INHALATION_SPRAY | Freq: Two times a day (BID) | RESPIRATORY_TRACT | 12 refills | Status: AC

## 2023-11-28 ENCOUNTER — Ambulatory Visit: Payer: Managed Care, Other (non HMO) | Admitting: Family Medicine

## 2023-12-01 ENCOUNTER — Other Ambulatory Visit: Payer: Self-pay | Admitting: Family Medicine

## 2023-12-12 ENCOUNTER — Ambulatory Visit: Admitting: Family Medicine

## 2023-12-19 ENCOUNTER — Encounter: Payer: Self-pay | Admitting: Family Medicine

## 2023-12-19 ENCOUNTER — Ambulatory Visit: Payer: Self-pay | Admitting: Family Medicine

## 2023-12-19 VITALS — BP 128/84 | HR 112 | Temp 98.5°F | Wt 277.6 lb

## 2023-12-19 DIAGNOSIS — E1165 Type 2 diabetes mellitus with hyperglycemia: Secondary | ICD-10-CM | POA: Diagnosis not present

## 2023-12-19 DIAGNOSIS — E785 Hyperlipidemia, unspecified: Secondary | ICD-10-CM

## 2023-12-19 DIAGNOSIS — I10 Essential (primary) hypertension: Secondary | ICD-10-CM | POA: Diagnosis not present

## 2023-12-19 LAB — COMPREHENSIVE METABOLIC PANEL WITH GFR
ALT: 19 U/L (ref 0–35)
AST: 19 U/L (ref 0–37)
Albumin: 3.7 g/dL (ref 3.5–5.2)
Alkaline Phosphatase: 140 U/L — ABNORMAL HIGH (ref 39–117)
BUN: 17 mg/dL (ref 6–23)
CO2: 28 meq/L (ref 19–32)
Calcium: 9.3 mg/dL (ref 8.4–10.5)
Chloride: 95 meq/L — ABNORMAL LOW (ref 96–112)
Creatinine, Ser: 1.4 mg/dL — ABNORMAL HIGH (ref 0.40–1.20)
GFR: 45.27 mL/min — ABNORMAL LOW (ref >60.00)
Glucose, Bld: 125 mg/dL — ABNORMAL HIGH (ref 70–99)
Potassium: 3.8 meq/L (ref 3.5–5.1)
Sodium: 134 meq/L — ABNORMAL LOW (ref 135–145)
Total Bilirubin: 0.5 mg/dL (ref 0.2–1.2)
Total Protein: 7.8 g/dL (ref 6.0–8.3)

## 2023-12-19 LAB — MICROALBUMIN / CREATININE URINE RATIO
Creatinine,U: 122.8 mg/dL
Microalb Creat Ratio: 70.3 mg/g — ABNORMAL HIGH (ref 0.0–30.0)
Microalb, Ur: 8.6 mg/dL — ABNORMAL HIGH (ref 0.0–1.9)

## 2023-12-19 LAB — LIPID PANEL
Cholesterol: 217 mg/dL — ABNORMAL HIGH (ref 0–200)
HDL: 46.3 mg/dL (ref >39.00)
LDL Cholesterol: 141 mg/dL — ABNORMAL HIGH (ref 0–99)
NonHDL: 170.41
Total CHOL/HDL Ratio: 5
Triglycerides: 148 mg/dL (ref 0.0–149.0)
VLDL: 29.6 mg/dL (ref 0.0–40.0)

## 2023-12-19 LAB — HEMOGLOBIN A1C: Hgb A1c MFr Bld: 7.3 % — ABNORMAL HIGH (ref 4.6–6.5)

## 2023-12-19 MED ORDER — DESOXIMETASONE 0.25 % EX OINT: 1.0000 | TOPICAL_OINTMENT | Freq: Two times a day (BID) | CUTANEOUS | 1 refills | Status: DC

## 2023-12-19 MED ORDER — OMEPRAZOLE 40 MG PO CPDR: DELAYED_RELEASE_CAPSULE | ORAL | 3 refills | Status: AC

## 2023-12-19 NOTE — Progress Notes (Signed)
 Established Patient Office Visit  Subjective   Patient ID: Ashley Henry, female    DOB: 1976-01-23  Age: 48 y.o. MRN: [id number]  No chief complaint on file.   HPI   Ashley Henry is seen for medical follow-up.  She has history of hypertension, sarcoidosis, type 2 diabetes, past history of kidney stones, history of vitamin D deficiency.  She is maintained per pulmonary on low-dose prednisone 5 mg daily.  She has hypertension which has been managed with losartan, amlodipine, and HCTZ.  Blood pressures have been stable.  She does have type 2 diabetes currently not on medication.  Last A1c 6.5%.  She has had some poor compliance recently with diet and some weight gain.  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  She is getting regular eye exams.  Has not had any recent fasting lipids.  She is fasting today.  Also has history of chronic kidney disease with most recent GFR 45.  She has another issue of pruritic rash most involving left hand.  She has very pruritic small papules that come up and sometimes on the sides of the fingers.  These eventually dry out but are very pruritic.  She has tried lower potency steroid creams without much improvement  Past Medical History:  Diagnosis Date   Abnormal CT scan of lung 07/13/2020   Abnormal finding on imaging of liver 07/13/2020   Anemia    BACK PAIN, UPPER 07/27/2010   Qualifier: Diagnosis of  By: Caryl Never MD, Aashir Umholtz     BRONCHITIS, ACUTE 07/16/2010   Qualifier: Diagnosis of  By: Caryl Never MD, Carlena Hurl, GREAT TOE 01/08/2010   Qualifier: Diagnosis of  By: Caryl Never MD, Yecheskel Kurek     Enlarged uterus 05/13/2011   Essential hypertension 09/07/2009   Qualifier: Diagnosis of  By: Yetta Barre MD, Bernadene Bell.    HEADACHE 08/03/2010   Qualifier: Diagnosis of  By: Caryl Never MD, Alexandrea Westergard     HYPERLIPIDEMIA 10/11/2009   Hypertension 2010   no meds now   INGROWN NAIL 02/08/2010   Qualifier: Diagnosis of  By: Caryl Never MD, Kacy Conely      Iron deficiency anemia due to chronic blood loss 06/20/2014   Kidney stone 10/30/2015   OBESITY 10/11/2009   Qualifier: Diagnosis of  By: Caryl Never MD, Yina Riviere     PARESTHESIA 08/03/2010   Qualifier: Diagnosis of  By: Caryl Never MD, Annaelle Kasel     Pre-diabetes    HgbA1c 6.2 on 09-30-17   Renal disorder    SINUSITIS, ACUTE 12/07/2009   Qualifier: Diagnosis of  By: Caryl Never MD, Mayetta Castleman     SORE THROAT 02/14/2010   Qualifier: Diagnosis of  By: Gabriel Rung LPN, Sherron Ales D deficiency 07/13/2020   Past Surgical History:  Procedure Laterality Date   BREAST REDUCTION SURGERY  1999   BUBBLE STUDY  05/16/2022   Procedure: BUBBLE STUDY;  Surgeon: Pricilla Riffle, MD;  Location: [facility] ENDOSCOPY;  Service: Cardiovascular;;   CESAREAN SECTION  2004   Cysts removed from chest wall     EYE SURGERY Left    MASS EXCISION N/A 04/14/2018   Procedure: EXCISION OF CHEST WALL MASS;  Surgeon: Abigail Miyamoto, MD;  Location: [facility];  Service: General;  Laterality: N/A;   TEE WITHOUT CARDIOVERSION N/A 05/16/2022   Procedure: TRANSESOPHAGEAL ECHOCARDIOGRAM (TEE);  Surgeon: Pricilla Riffle, MD;  Location: [facility] ENDOSCOPY;  Service: Cardiovascular;  Laterality: N/A;    reports  that she has never smoked. She has never used smokeless tobacco. She reports that she does not drink alcohol and does not use drugs. family history includes Diabetes in her mother; Heart disease in her maternal uncle; Hypertension in her father and mother; Lung cancer in her maternal grandfather; Prostate cancer in her maternal uncle; Stomach cancer in her maternal uncle and paternal grandmother; Thyroid disease in her maternal uncle. Allergies  Allergen Reactions   Multihance [Gadobenate] Nausea And Vomiting   Collagenase Hives   Collagenase Clostridium Histolyticum Hives   Aspirin Rash    Review of Systems  Constitutional:  Negative for malaise/fatigue.  Eyes:  Negative for blurred vision.  Cardiovascular:  Negative for chest  pain.  Skin:  Positive for rash.  Neurological:  Negative for dizziness, weakness and headaches.      Objective:     BP 128/84 (BP Location: Left Arm, Patient Position: Sitting)   Pulse (!) 112   Temp 98.5 F (36.9 C) (Oral)   Wt 277 lb 9.6 oz (125.9 kg)   SpO2 95%   BMI 47.65 kg/m  BP Readings from Last 3 Encounters:  12/19/23 128/84  07/30/23 126/80  05/16/23 (!) 140/80   Wt Readings from Last 3 Encounters:  12/19/23 277 lb 9.6 oz (125.9 kg)  07/30/23 273 lb 1.6 oz (123.9 kg)  04/29/23 274 lb (124.3 kg)      Physical Exam Vitals reviewed.  Constitutional:      Appearance: She is well-developed.  Eyes:     Pupils: Pupils are equal, round, and reactive to light.  Neck:     Thyroid: No thyromegaly.     Vascular: No JVD.  Cardiovascular:     Rate and Rhythm: Normal rate and regular rhythm.     Heart sounds:     No gallop.  Pulmonary:     Effort: Pulmonary effort is normal. No respiratory distress.     Breath sounds: Normal breath sounds. No wheezing or rales.  Musculoskeletal:     Cervical back: Neck supple.  Skin:    Findings: Rash present.     Comments: Left hand volar surface over the thenar eminence she has few scattered small pinpoint vesicular type lesions.  Nontender.  Neurological:     Mental Status: She is alert.      No results found for any visits on 12/19/23.    The ASCVD Risk score (Arnett DK, et al., 2019) failed to calculate for the following reasons:   Cannot find a previous HDL lab   Cannot find a previous total cholesterol lab    Assessment & Plan:   #1 type 2 diabetes.  She has managed to control this without medication thus far.  Recheck A1c today and also urine microalbumin.  Continue yearly eye exams.  Tighten up diet and try to lose some weight.  We also encouraged her to try to establish more consistent exercise.  Also needs fasting lipid and this will be ordered today.  She is on low-dose prednisone and also has very strong  family history of diabetes which is likely exacerbating.  She also takes HCTZ which can have some effect.  #2 hypertension stable and controlled on HCTZ, losartan, and amlodipine.  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  Topicort 0.25% ointment to use twice daily as needed but no more than 2 weeks continuously.     Return in about 4 months (around 04/19/2024).    Evelena Peat, MD

## 2023-12-22 MED ORDER — LOSARTAN POTASSIUM 50 MG PO TABS: 50.0000 mg | ORAL_TABLET | Freq: Every day | ORAL | 0 refills | Status: DC

## 2023-12-22 NOTE — Addendum Note (Signed)
 Addended by: Christy Sartorius on: 12/22/2023 10:28 AM   Modules accepted: Orders

## 2023-12-22 NOTE — Progress Notes (Signed)
 Noted.  Kristian Covey MD [facility]

## 2023-12-22 NOTE — Addendum Note (Signed)
 Addended by: Christy Sartorius on: 12/22/2023 10:29 AM   Modules accepted: Orders

## 2024-01-01 ENCOUNTER — Other Ambulatory Visit: Payer: Self-pay | Admitting: Family Medicine

## 2024-01-05 ENCOUNTER — Encounter: Payer: Self-pay | Admitting: Family Medicine

## 2024-01-05 MED ORDER — BENZONATATE 100 MG PO CAPS: ORAL_CAPSULE | ORAL | 2 refills | Status: AC

## 2024-01-18 ENCOUNTER — Other Ambulatory Visit: Payer: Self-pay | Admitting: Family Medicine

## 2024-01-18 DIAGNOSIS — Z3009 Encounter for other general counseling and advice on contraception: Secondary | ICD-10-CM

## 2024-01-21 ENCOUNTER — Encounter: Payer: Self-pay | Admitting: *Deleted

## 2024-01-21 ENCOUNTER — Encounter: Payer: Self-pay | Admitting: Family Medicine

## 2024-01-21 NOTE — Telephone Encounter (Signed)
 Unable to leave a message due to voicemail being full.  Rx done and Mychart message sent with information below from PCP.

## 2024-01-22 ENCOUNTER — Other Ambulatory Visit: Payer: Self-pay | Admitting: Family Medicine

## 2024-01-22 DIAGNOSIS — Z3009 Encounter for other general counseling and advice on contraception: Secondary | ICD-10-CM

## 2024-03-13 ENCOUNTER — Other Ambulatory Visit: Payer: Self-pay | Admitting: Family Medicine

## 2024-03-14 ENCOUNTER — Other Ambulatory Visit: Payer: Self-pay | Admitting: Family Medicine

## 2024-03-14 DIAGNOSIS — Z3009 Encounter for other general counseling and advice on contraception: Secondary | ICD-10-CM

## 2024-03-15 ENCOUNTER — Other Ambulatory Visit: Payer: Self-pay | Admitting: Family Medicine

## 2024-03-15 ENCOUNTER — Encounter: Payer: Self-pay | Admitting: Family Medicine

## 2024-03-15 DIAGNOSIS — Z3009 Encounter for other general counseling and advice on contraception: Secondary | ICD-10-CM

## 2024-03-16 ENCOUNTER — Other Ambulatory Visit: Payer: Self-pay | Admitting: Family Medicine

## 2024-03-16 DIAGNOSIS — Z3009 Encounter for other general counseling and advice on contraception: Secondary | ICD-10-CM

## 2024-03-16 MED ORDER — NORGESTIMATE-ETH ESTRADIOL 0.18/0.215/0.25 MG-25 MCG PO TABS: 1.0000 | ORAL_TABLET | Freq: Every day | ORAL | 1 refills | Status: AC

## 2024-03-19 IMAGING — CT CT CHEST W/ CM
2 of 4 series · 15 of 36 positions shown, 18 images · IV contrast (OMNIPAQUE 300)
Comparison: Chest CT 05/29/2020.

CLINICAL DATA: 46-year-old female with history of persistent cough.
History of sarcoidosis.

EXAM:
CT CHEST WITH CONTRAST
TECHNIQUE: Multidetector CT imaging of the chest was performed during
intravenous contrast administration.

[Series 2: thorax · axial · 0.67mm/px · z∈[-278,-42]mm · 12 of 140 slices shown, 15 images]
[im 11/140  mediastinal]
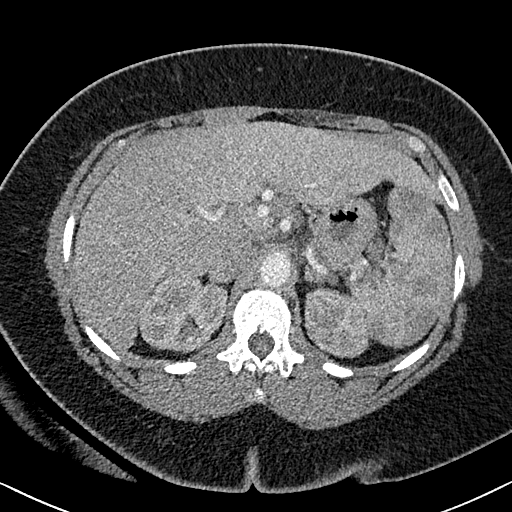
[im 11/140  lung]
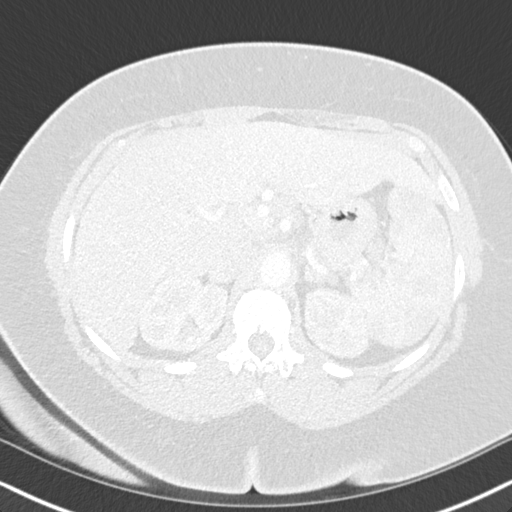
[im 22/140  lung]
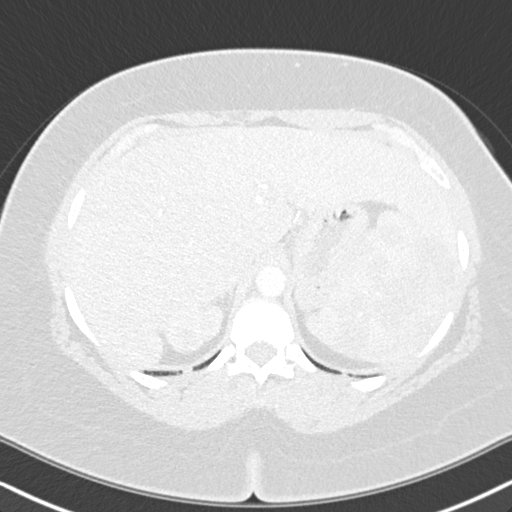
[im 33/140  lung]
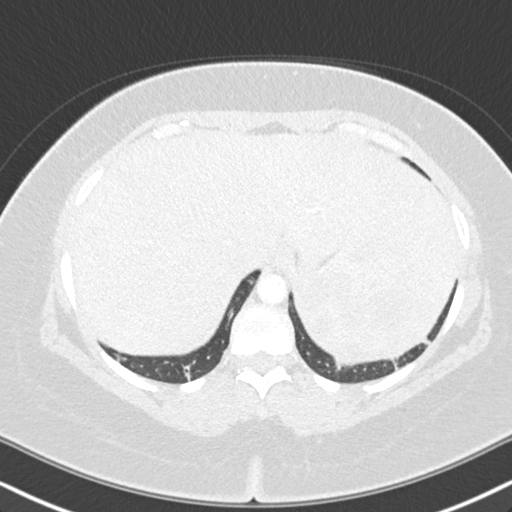
[im 43/140  lung]
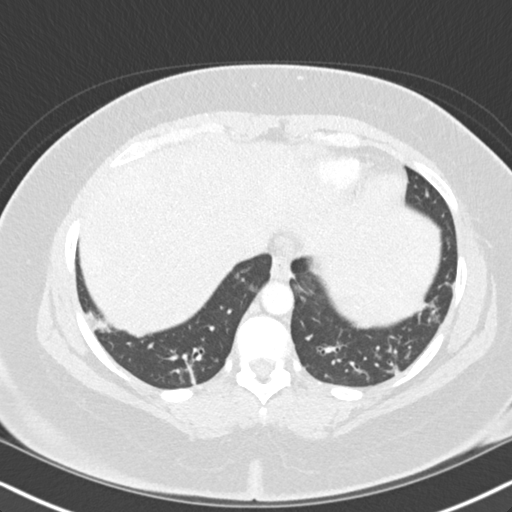
[im 54/140  mediastinal]
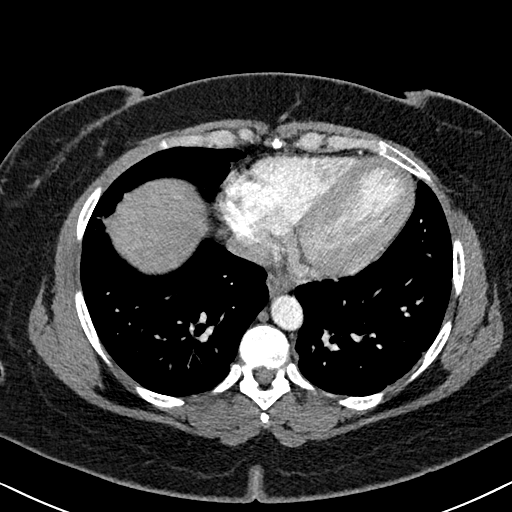
[im 54/140  lung]
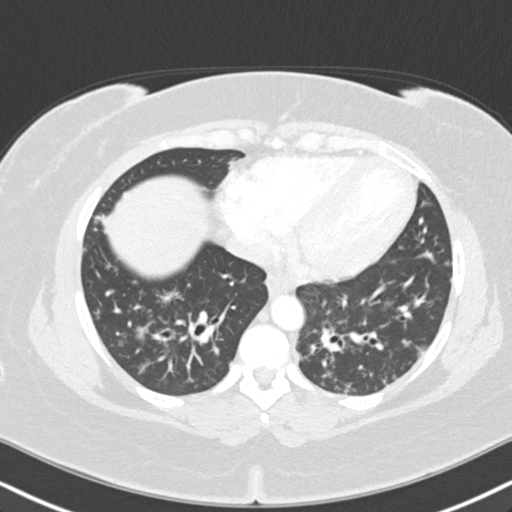
[im 65/140  lung]
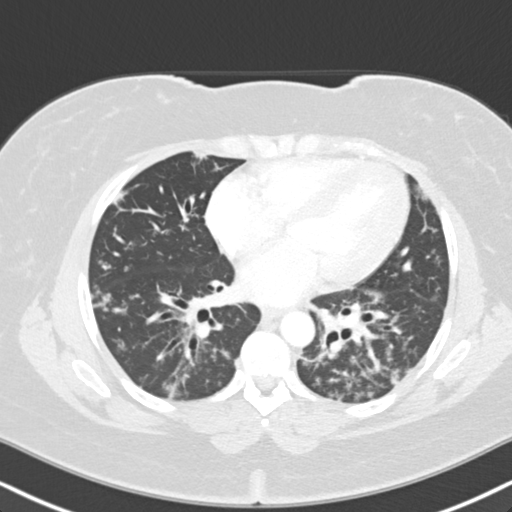
[im 75/140  lung]
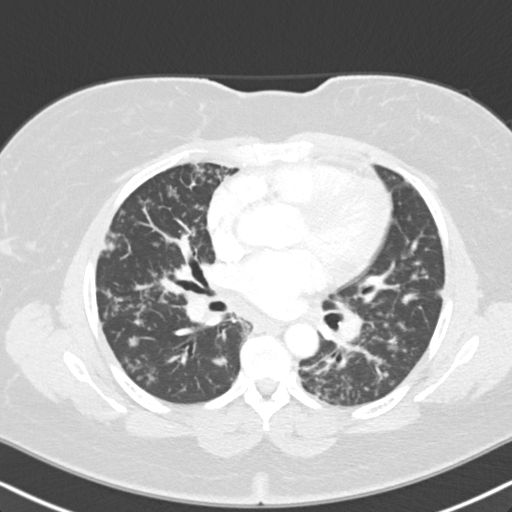
[im 86/140  lung]
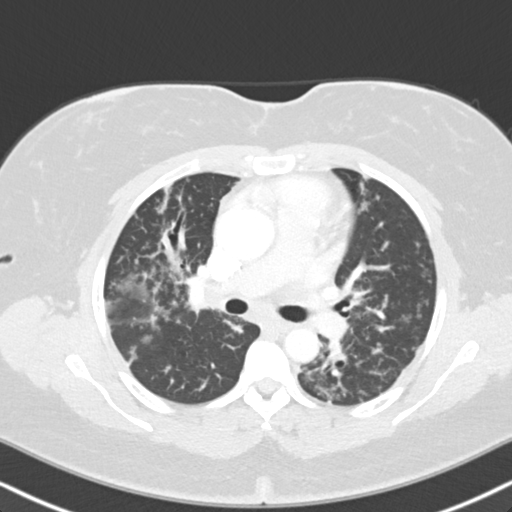
[im 97/140  mediastinal]
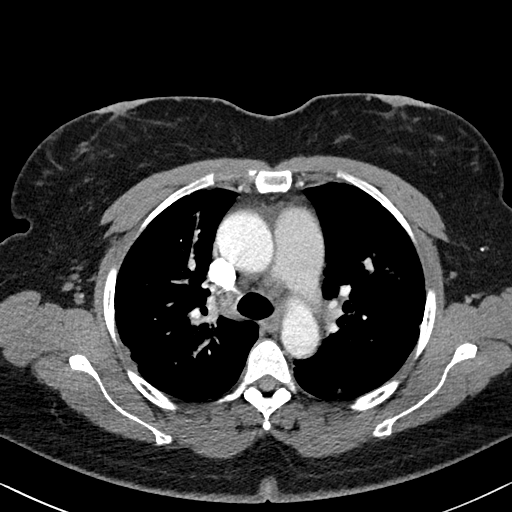
[im 97/140  lung]
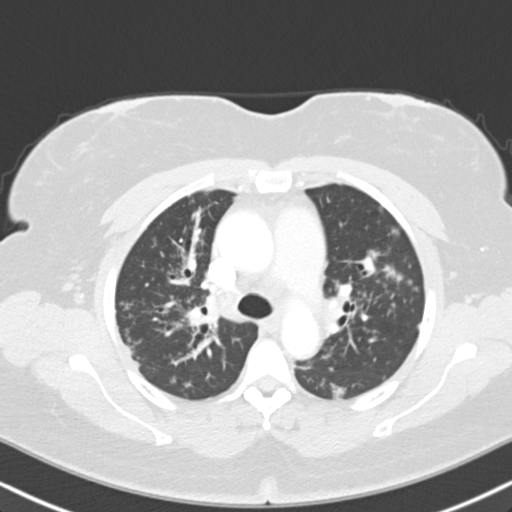
[im 107/140  lung]
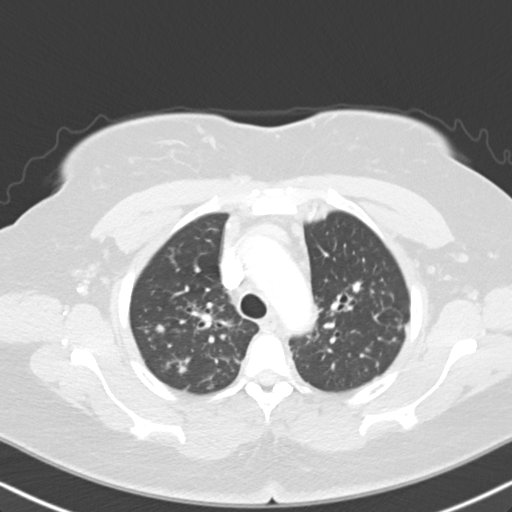
[im 118/140  lung]
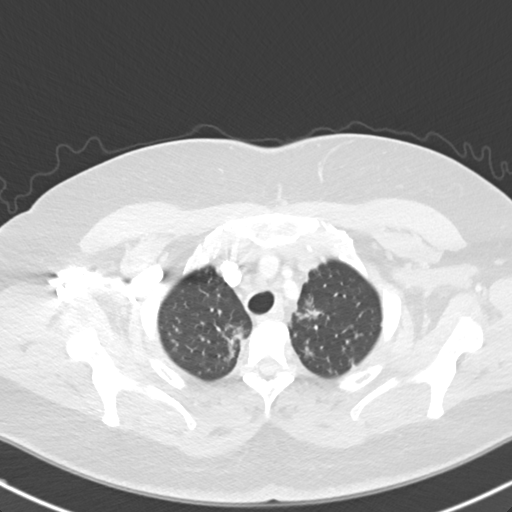
[im 129/140  lung]
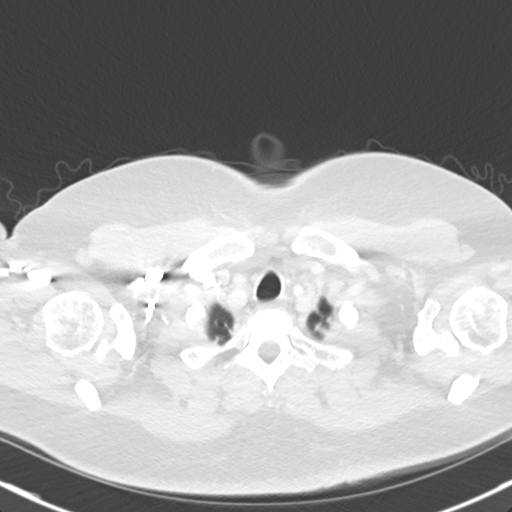

[Series 5: coronal · coronal · 0.56mm/px · 3 of 118 slices shown]
[im 24/118  lung]
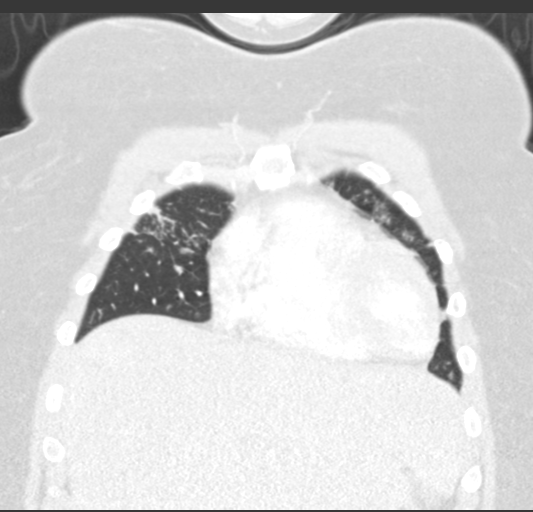
[im 47/118  lung]
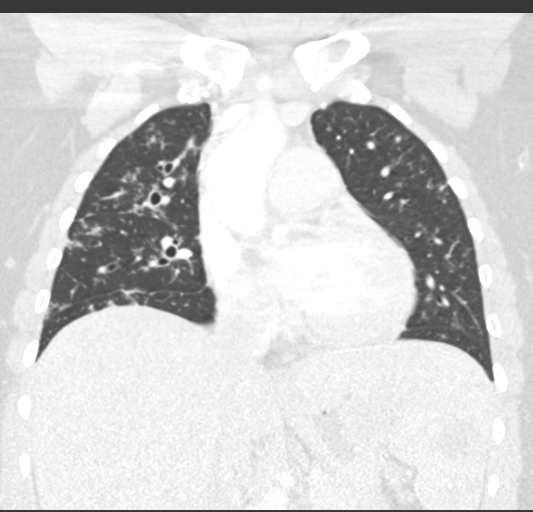
[im 71/118  lung]
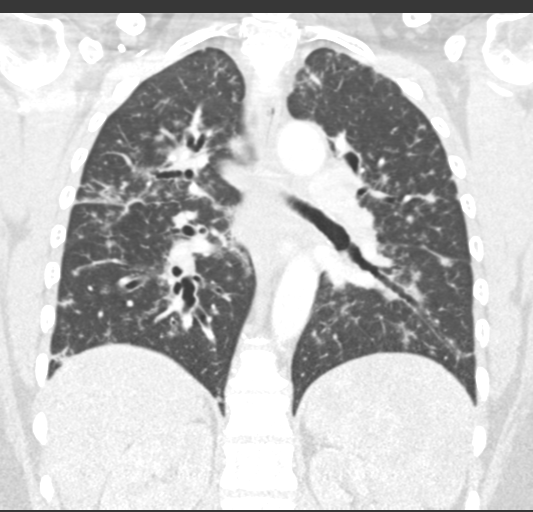

[15 of 36 positions shown; findings below may reference images not displayed]

RADIATION DOSE REDUCTION: This exam was performed according to the
departmental dose-optimization program which includes automated
exposure control, adjustment of the mA and/or kV according to
patient size and/or use of iterative reconstruction technique.

CONTRAST:  80mL OMNIPAQUE IOHEXOL 300 MG/ML  SOLN
FINDINGS: Cardiovascular: Heart size is normal. There is no significant
pericardial fluid, thickening or pericardial calcification.
Atherosclerotic calcifications in the ascending thoracic aorta. No
definite coronary artery calcifications are confidently identified.
Dilatation of the pulmonic trunk (3.7 cm in diameter).

Mediastinum/Nodes: Multiple prominent but nonenlarged mediastinal
and bilateral hilar lymph nodes are noted. Esophagus is unremarkable
in appearance. No axillary lymphadenopathy.

Lungs/Pleura: Diffuse thickening of the peribronchovascular
interstitium with widespread micro and macronodularity scattered
throughout the lungs bilaterally, predominantly in a
peribronchovascular distribution, but also prevalent in subpleural
distribution (i.e., the overall distribution is perilymphatic), with
definitive progression compared to the prior study. No confluent
consolidative airspace disease. No pleural effusions.

Upper Abdomen: Multiple large low-attenuation lesions scattered
throughout the spleen measuring up to 4.1 x 3.0 cm.

Musculoskeletal: There are no aggressive appearing lytic or blastic
lesions noted in the visualized portions of the skeleton.
IMPRESSION: 1. There has been dramatic increase in parenchymal lung involvement
with extensive progressive perilymphatic nodularity, as detailed
above, compatible with worsening sarcoidosis.
2. Multiple large low-attenuation splenic lesions, nonspecific, but
likely reflective of sarcoid involvement in the spleen.
3. Dilatation of the pulmonic trunk (3.7 cm in diameter), concerning
for pulmonary arterial hypertension.
4. Aortic atherosclerosis.

Aortic Atherosclerosis ([id number]).

## 2024-03-26 ENCOUNTER — Other Ambulatory Visit: Payer: Self-pay | Admitting: Family Medicine

## 2024-03-30 ENCOUNTER — Other Ambulatory Visit: Payer: Self-pay | Admitting: Internal Medicine

## 2024-04-01 ENCOUNTER — Other Ambulatory Visit: Payer: Self-pay | Admitting: Family Medicine

## 2024-04-30 NOTE — Telephone Encounter (Signed)
 error

## 2024-05-31 ENCOUNTER — Other Ambulatory Visit: Payer: Self-pay | Admitting: Family Medicine

## 2024-06-16 ENCOUNTER — Encounter: Payer: Self-pay | Admitting: Internal Medicine

## 2024-06-27 NOTE — Patient Instructions (Signed)
 Sarcoidosis Chronic cough Dyspnea on exertion Mediastinal adenopathy Current use of chronic steroids  - Last seen August 2024.  And since then subjectively stable on prednisone  5 mg/day.  You have upcoming CT scan of the chest November 2025   Plan - Keep CT scan of the chest appointment for November 2025 and we will inform you of the results -Do pulmonary function test in 6 months spirometry and DLCO -Continue prednisone  5 mg/day - Continue Symbicort  empiric inhaler therapy schedule -Respect no interest in interested in methotrexate , or infliximab or clinical trials due to safety concern     Pigmented skin lesions  -Very suspicious of sarcoidosis.  This appears to be an ongoing problem even on video visit 06/28/2024   Plan - According to dermatology  Rheumatoid factor positive -mildly positive in 2021 and persistent though March 2024 Associted arthralgia  -  not reported as a problem on this visit October 2025  Plan  - monitor -  - at some point can decide on rheumatology referral   Abnormal CT of liver along with MRI of the liver and spleen in 2021 ad in CT March 2023   Plan - Refer to gastroenterology some time in future [deferred in the prior visits]   HY   Follow-up - 6 months 15-minute visit after pulmonary function testing.

## 2024-06-27 NOTE — Progress Notes (Unsigned)
 OV 06/19/2020  Subjective:  Patient ID: Ashley [place], female , DOB: Dec 25, 1975 , age 48 y.o. , MRN: [id number] , ADDRESS: [address] [place] [address]  PCP Micheal Wolm ORN, MD Attending : Treatment Team:  Attending Provider: Geronimo Amel, MD    06/19/2020 -   Chief Complaint  Patient presents with   Consult    Patient is here for a productive/dry cough that she has had for several months with brown/yellow thivk sputum. Shortness of breath with exertion, stong smells, to hot.      HPI Ashley [place] 48 y.o. -referred by primary care physician Burchette, Wolm ORN, MD.  Patient is a psychotherapist.  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  Her 3 maternal uncles, her brother and her maternal aunt all have sarcoidosis.  Her uncles are deceased although from other reasons.  Her brother and her aunt are still living.  She tells me that in the last 6 months he has had insidious onset of chronic cough, shortness of breath, night sweats and chills.  These have persisted.  This is despite having an intentional 8 pound weight loss during this time.  She says cough is made worse by talking or exertion.  Similarly shortness of breath is made worse by exertion.  Also made worse by talking through the mask during counseling sessions.  Both are relieved by rest.  Cough is also relieved by not talking.  She does have nocturnal awakening with cough chest tightness and wheezing.  She also reports right infra axillary right upper quadrant pain with coughing.  She does feel chest tightness deep inside the chest.  She had CT scan of the chest that I personally visualized.  Shows mediastinal lymphadenopathy consistent with stage I sarcoidosis.  CT scan abdomen cuts on the chest CT show possible evidence of sarcoidosis in the liver.  MRI has been recommended.  There are associated palpitations and night sweats.  Cough is associated with sputum productin     IMPRESSION:  1.  Pulmonary parenchymal and thoracic nodal findings which are consistent with sarcoidosis. 2. Heterogeneous density throughout the liver. Possibly heterogeneous steatosis. Hepatic involvement of sarcoidosis could look similar. If there are right upper quadrant symptoms or abnormal liver function test, pre and post contrast abdominal MRI should be considered.     Electronically Signed   By: Rockey Kilts M.D.   On: 05/29/2020 10:59  ROS - per HPI  Results for Ashley [place] (MRN [id number]) as of 06/19/2020 09:03  Ref. Range 09/26/2017 14:27 10/13/2017 16:54 10/06/2018 14:47  Anti Nuclear Antibody (ANA) Latest Ref Range: NEGATIVE   NEGATIVE   SSA (Ro) (ENA) Antibody, IgG Latest Ref Range: <1.0 NEG AI <1.0 NEG    SSB (La) (ENA) Antibody, IgG Latest Ref Range: <1.0 NEG AI <1.0 NEG       07/13/2020 Follow up : Cough/? Sarcoid /Abnormal CT chest and MRI AB   48 year old female never smoker seen for pulmonary consult June 19, 2020 for 56-month history of chronic cough shortness of breath night sweats and abnormal CT chest suspicious for sarcoidosis.  Patient has a very strong family history of sarcoidosis.  Patient presents for a 3-week follow-up.  Patient was seen last visit for pulmonary consult for a 26-month history of cough shortness of breath night sweats and chills.  She was seen by her primary care provider.  Subsequent CT chest May 29, 2020 showed pulmonary nodularity, possible bilateral bilateral hilar  adenopathy, liver abnormalities.  This was suspicious for possible sarcoidosis.  Lab work last visit showed an elevated ACE level at 82 and a high sed rate at greater than 130.  Her rheumatoid factor was positive but CCP was negative.  INR was slightly elevated at 1.1.  Patient anemia was stable with hemoglobin at 9.3.  Her platelets were normal.  Hepatic function testing was normal.  Patient was set up for an MRI abdomen that showed abnormalities with a hyperenhancing area in the medial  segment of the left hepatic lobe hepatic nodularity.  And splenic lesions.  There was suspicion for portal hypertension with possible varices versus a distal esophageal hyperenhancement.  There was no signs of steatosis. Patient has significant fatigue low energy decreased activity tolerance cough joint pains.  She has had intermittent rashes but has none currently.  She has had a recent eye exam with no reported abnormalities. We discussed all of her test and lab results. Has very strong family history of sarcoidosis with 3 maternal uncles, her brother and a maternal aunt that all have sarcoidosis. Patient has had intentional weight loss says that she has been on a diet and is down greater than 89 pounds. She denies any hemoptysis bloody stools hematemesis, bruising.  We had a long discussion regarding her abnormalities in her lab work and recent MRI of her liver and spleen.  We discussed the need to proceed with probable bronchoscopy with biopsy and referral to GI for consideration for further work-up of liver involvement and abnormalities. Unfortunately patient's medical insurance is going to stop July 16, 2020.  She declines flu shot and Covid vaccines  Patient works independently as a Airline pilot.    TEST/EVENTS :  Chest x-ray September 29, 2017 clear lungs no acute process  Renal CT April 2017-no focal abnormalities noted in the liver, spleen or pancreas, nonobstructive left nephrolithiasis, moderate right hydroureter nephrosis  CT chest without contrast May 29, 2020 showed perilymphatic, peribronchovascular and subpleural distribution of pulmonary nodularity throughout right apical nodule 5 mm and nodularity along the left major fissure measuring 9 mm.  Heterogeneous density throughout the liver.,  Possible mild bilateral hilar adenopathy  MR abdomen July 08, 2020 hyperenhancing area in the medial segment of the left hepatic lobe, background of nodular hepatic contours and  hepatic heterogeneity atypical for sarcoid associated lesion given arterial enhancement other areas more typical for sarcoid but remain nonspecific.,  Splenic lesions noted.  Potential portal hypertension with varices versus distal esophageal hyperenhancement, no signs of steatosis  Labs June 19, 2020: QuantiFERON gold negative, Sjogren's negative, LFTs normal, rheumatoid factor positive, CCP negative, INR 1.1, allergy profile negative, IgE 67, Anti-DNA antibody negative, hepatic panel normal, platelets normal, WBC 7.1 ACE level elevated 82, sed rate greater than 130, vitamin D  8, hemoglobin 9.3 hematocrit 28  2D echo July 07, 2020 EF 55 to 60%, mild LVH, pulmonary artery pressure normal  Previous iron  levels were noted low  HIV 2019 neg.   OV 11/22/2021  Subjective:  Patient ID: Ashley [place], female , DOB: 25-Jul-1976 , age 71 y.o. , MRN: [id number] , ADDRESS: [address] [place] [address] PCP Micheal Wolm ORN, MD Patient Care Team: Micheal Wolm ORN, MD as PCP - General  This Provider for this visit: Treatment Team:  Attending Provider: Geronimo Amel, MD    11/22/2021 -   Chief Complaint  Patient presents with   Follow-up    Pt states she is coughing a lot each day and also  states that her skin has been breaking out. States first thing in the morning she will cough up a lot of phlegm. States that she does have increased SOB especially when exercising.     ICD-10-CM   1. Sarcoidosis  D86.9     2. Chronic cough  R05.3     3. Dyspnea on exertion  R06.09     4. Mediastinal adenopathy  R59.0     5. Vitamin D  deficiency  E55.9     6. Anemia, unspecified type  D64.9     7. Rheumatoid factor positive  R76.8     8. Abnormal CT of liver  R93.2     9. Pigmented skin lesions  L81.9        HPI Ashley [place] 48 y.o. -returns for follow-up.  Last seen personally in 2021.  At that time the concern was all his symptoms are pointing toward sarcoidosis.  She  then saw a nurse practitioner Madelin Passy in October 2021.  She was confirmed to have sarcoidosis clinically based on high angiotensin-converting enzyme, African-American ethnicity, age and symptoms and CT scan findings of the lung.  She was then prescribed prednisone  for 3 months she says during this time all the symptoms resolved.  She did not have insurance so did not do any further work-up.  She says then early last year she ran out of prednisone  and since then having significant symptoms.  The symptoms include significant amount of night sweats, shortness of breath and wheezing.  Also severe cough day and night.  In the early morning the cough is productive with yellow sputum but rest of the day the cough is dry.  She works as a Airline pilot and as she talks the cough is even worse.  She showed a voice recording of the cough and there is clear laryngeal component to the cough.  Symptoms are rated as severe.  Cough not responding to Tessalon  cough.  During this time she had skin lesions and arthralgia.  The skin lesions on the hands forehead.  There is also drying of the palms.  There is arthralgia diffusely.  Vitamin D  deficiency: This was diagnosed in 2021.  Not on treatment currently  Anemia: This was diagnosed in 2021.  Not on treatment currently   Liver lesion seen on MRI along with splenic lesions: These were reported as atypical for sarcoid.  She never saw a GI  Skin lesions: This is new since last visit.  She is open to seeing dermatologist.      OV 12/18/2021  Subjective:  Patient ID: Ashley [place], female , DOB: Nov 14, 1975 , age 33 y.o. , MRN: [id number] , ADDRESS: [address] [place] [address] PCP Micheal Wolm ORN, MD Patient Care Team: Micheal Wolm ORN, MD as PCP - General    12/18/2021 -this video visit is to review test results done due to concern of sarcoidosis.   HPI Ashley [place] 48 y.o. -since her last visit a few weeks ago she continues to have  the same symptoms of arthralgia and shortness of breath.  And skin lesions.  Referred her to the dermatologist and GI because of concerns of splenic lesions.  She has not yet seen them.  At this point in time she is focused on getting through symptom relief.  We did do investigations and they are as follows  CT scan of the chest: Shows worsening sarcoidosis.  I visualized the film and also showed it to the video  Pulmonary function test: Not yet done  Blood work: Shows worsening angiotensin-converting enzyme level to greater than 180.  Persistently high ESR.Ashley [place]  Rheumatoid factor continues to be trace positive.  Continues to be iron  deficient anemic with a hemoglobin 9.9 g% which is baseline since 2014.  She says primary care physician has not yet addressed this.  IMPRESSION: 1. There has been dramatic increase in parenchymal lung involvement with extensive progressive perilymphatic nodularity, as detailed above, compatible with worsening sarcoidosis. 2. Multiple large low-attenuation splenic lesions, nonspecific, but likely reflective of sarcoid involvement in the spleen. 3. Dilatation of the pulmonic trunk (3.7 cm in diameter), concerning for pulmonary arterial hypertension. 4. Aortic atherosclerosis.   Aortic Atherosclerosis (ICD10-I70.0).     Electronically Signed   By: Toribio Aye M.D.   On: 12/12/2021 07:41   01/28/2022 -   Chief Complaint  Patient presents with   Follow-up    PFT performed 01/25/22. Pt states she has been doing okay since last visit and denies any complaints.     HPI Ashley [place] 48 y.o. -last office visit April 2023 on video.  Presents with her daughter who is at the bedside.  Diagnosis worsening sarcoidosis symptomatic given.  Commenced on prednisone .  I wrote for prednisone  taper of 40 mg x 2 weeks and then 20 mg x 2 weeks.  The pharmacist informed that I skipped a dose of 30 mg for 2 weeks.  Therefore she took 40 mg for 2 weeks and then 30 mg for 2  weeks.  She is currently on 20 mg daily and she is finished 1 week of that.  She wants to extend the taper and go to 10 mg/day and then continue that per instructions.  The prednisone  is really helped shortness of breath is improved cough is improved.  Her skin rash in the face and hands have improved.  Nevertheless she is having prednisone  side effects of increased hunger 10 pound weight gain decreased sleep increased alertness.  Also facial puffiness.  Nevertheless she is continuing with the prednisone  and she is okay doing that.  We did get an echocardiogram.  Previous echocardiogram 2021 was normal.  Currently is reporting moderate to severe mitral regurgitation and rater than 4 cm aortic root dilatation.  I did inform her of these findings.  I also passed a message on to Dr. Monetta her cardiologist.  Pulmonary function shows mild restriction with mild reduction in diffusion capacity.    OV 05/14/2022  Subjective:  Patient ID: Ashley [place], female , DOB: 01-25-1976 , age 18 y.o. , MRN: [id number] , ADDRESS: [address] [place] [address] PCP Micheal Wolm ORN, MD Patient Care Team: Micheal Wolm ORN, MD as PCP - General  This Provider for this visit: Treatment Team:  Attending Provider: Geronimo Amel, MD    05/14/2022 -   Chief Complaint  Patient presents with   Follow-up    PFT performed today.  Pt states she is about the same compared to last visit.   Follow-up pulmonary sarcoidosis last CT scan March 2023  HPI Ashley [place] 48 y.o. -returns for follow-up.  At the last visit her symptoms and the skin lesions were better after prednisone  dosage.  We started tapering the prednisone .  Today she presents for follow-up.  She is now on 10 mg/day prednisone  but she says with the taper cough is worse shortness of breath is worse wheezing is worse.  In addition skin lesions are also getting worse.  She  is still gaining weight though she states she is gained another 10  pounds of weight on the current 10 mg of prednisone .  She is frustrated by the worsening symptoms.  She did a pulmonary function test today.  This shows worsening of FVC and FEV1 however she did have significant amount of cough which is part of her symptoms from the sarcoid.  Therefore the results are somewhat unreliable.  Her daughter is here with her today.  She had normal G6PD testing are slightly high back in May 2023 March 2023 the QuantiFERON gold was negative. She has iron  deficiency anemia when last checked in March 2023  She has mitral valve regurgitation and she saw Dr. Monetta and a TEE is pending very shortly.  She does admit to history of palpitations.  She does not recall having MRI of the heart or some kind of arrhythmia monitoring program.  I have written to Dr. Monetta about this.  We discussed the fact that she has progressive pulm sarcoidosis symptoms and the prednisone  is causing side effects and also in addition with the prednisone  taper she is having more symptoms.  Last visit we discussed the second line methotrexate  therapy.  She is open to the idea.  We went over the side effects in detail including common and rare side effects and the requirement for significant amount of blood monitoring.  She is agreeable to this.  We discussed referral to [facility] or [facility] for clinical trial participation by [employer] but she is not interested.  We discussed about seeing Dr. Slater Staff in our practice resident sarcoid expert but at this point in time she wants to hold off.       OV 08/15/2022  Subjective:  Patient ID: Ashley [place], female , DOB: [birth year] , age 10 y.o. , MRN: [id number] , ADDRESS: [address] [place] [address] PCP Micheal Wolm ORN, MD Patient Care Team: Micheal Wolm ORN, MD as PCP - General  This Provider for this visit: Treatment Team:  Attending Provider: Geronimo Amel, MD  Follow-up pulmonary sarcoidosis last CT scan March  2023  08/15/2022 -   Chief Complaint  Patient presents with   Follow-up    Doing good with prednisone  currently     HPI Ashley [place] 48 y.o. -returns for follow-up.  At this point in time she is back on prednisone  10 mg/day.  She finds this dose to be helpful for her.  Early in the morning she has severe cough that last 30 to 60 minutes and it has white sputum.  Then she takes a prednisone  10 mg/day and the cough goes away.  Overall quality of life is better no shortness of breath rest of the day there is no cough.  Her skin lesions in the face have resolved.  The skin lesions in the hands do persist but they are improved.  She is open to getting a dermatology referral right now.  She did have mitral regurgitation on the 2D echo when she had a TEE and it is only mild.  This was in August 2023 and I reviewed that result.  At this point in time she is content taking prednisone  10 mg/day for her sarcoid.  She does not want methotrexate .  In the past she declined to clinical trials.  Review of the labs indicate that she had anemia in March 2023.  I will will offer her repeat CBC testing.  We discussed vaccination she has had a COVID-vaccine but  not her flu.  She has never had a flu shot and does not intend to.     OV 09/24/2022  Subjective:  Patient ID: Ashley [place], female , DOB: [birth year] , age 34 y.o. , MRN: [id number] , ADDRESS: [address] [place] [address] PCP Micheal Wolm ORN, MD Patient Care Team: Micheal Wolm ORN, MD as PCP - General  This Provider for this visit: Treatment Team:  Attending Provider: Geronimo Amel, MD    09/24/2022 -   Chief Complaint  Patient presents with   Follow-up    Breathing has been good,winded with walking.      HPI Ashley [place] 48 y.o. -returns for follow-up with her daughter.  She is giving the history.  She tells me since last visit she has had 2 trips to [place] 1 time in November 2023 and the other 1 just this past  weekend.  She is having insidious onset of dyspnea on exertion relieved by rest.  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  Is been going on for 3 or 4 weeks relieved by rest.  She is also reporting bilateral ankle swelling around the same time.  Particularly with sitting.  She went [place] by car and it was definitely present.  Her baseline cough early in the morning for 30 minutes is unchanged.  This cough is made worse by perfume and smoke but now also present with the new onset of dyspnea.  She does have very streaky mild hemoptysis and thick yellow sputum but this is baseline.  Her hemoglobin was last checked a year ago and she was anemic.  I reviewed this result again.  She is wondering about getting a repeat CT scan of the chest to reassess her sarcoid.  She had a TEE in summer 2023 and she only had mild mitral valve prolapse.  I reviewed this result again.     OV 12/06/2022  Subjective:  Patient ID: Ashley [place], female , DOB: 06-15-1976 , age 56 y.o. , MRN: [id number] , ADDRESS: [address] [place] [address] PCP Micheal Wolm ORN, MD Patient Care Team: Micheal Wolm ORN, MD as PCP - General  This Provider for this visit: Treatment Team:  Attending Provider: Geronimo Amel, MD    12/06/2022 -   Chief Complaint  Patient presents with   Follow-up    Sarcoidosis f/up     HPI Ashley [place] 48 y.o. -returns for follow-up.  She has not had a pulmonary function test today.  She states that she is on 10 mg prednisone  taking it compliant.  Shortness of breath is improved although there is residual shortness of breath.  Ankle edema from previous visit is also resolved.  She is walking and this is helping her.  Her skin lesions are also improved.  However there are side effects from the prednisone  her hemoglobin A1c in January 2024 is gone up to greater than 7.  Her blood pressure today is elevated 180/120.  This the  first time it has been elevated.  She is not on antihypertensives.  Iron  deficiency anemia continues.  She states she is not taking iron  pills because she does not want to.  She is also got diminished sleep 25% of the time.  She is open to reducing her prednisone  to 7.5 mg/day.  Her cardiologist Dr. Monetta is retiring.  She is upcoming cardiology appointment with a new cardiologist  in May 2024.  Otherwise no new issues.         OV 02/20/2023  Subjective:  Patient ID: Ashley [place], female , DOB: Nov 18, 1975 , age 31 y.o. , MRN: [id number] , ADDRESS: [address] [place] [address] PCP Micheal Wolm ORN, MD Patient Care Team: Micheal Wolm ORN, MD as PCP - General  This Provider for this visit: Treatment Team:  Attending Provider: Geronimo Amel, MD    02/20/2023 -   Chief Complaint  Patient presents with   Follow-up    SOB with exertion.  Cough and wheeze persistent.     HPI Ashley [place] 48 y.o. -follow-up pulmonary sarcoidosis on prednisone   Returns for follow-up.  Presents with her daughter Donika Butner.  She is now on a reduced dose of prednisone  7.5 mg/day.  So far there is no recurrence of rash.  In the past reducing prednisone  is caused flareup of sarcoid.  However when prednisone  is increased her blood pressure is high and her blood sugar goes up.  Most recent hemoglobin A1c 7.2.  She not on any treatment for this.  She ideally wants to come off the prednisone  because this is prednisone  related hyperglycemia.  She is agreed to drop her prednisone  to 5 mg/day.  She is gotten hypertensive and since last visit she is on hydrochlorothiazide  and also Cozaar .  Her blood pressure has improved with a systolic of 132 today.  From a respiratory standpoint her pulmonary function test is stable compared to August 2023 but is still down compared to 1 year ago.  If this is very similar at 1.98 L / 54% and DLCO of 12.47/58%.  We talked about steroid sparing options.  She is  quite interested at this time and infliximab.  We went over methotrexate  again.  She is more fearful of methotrexate  even though this is less immunosuppressive.  This because of the side effect profile and personal experience she has heard from other people.  We went over TNF alpha blockade infliximab.  Went over the side effect profile on up-to-date that includes infusion reactions abdominal symptoms headaches and also long-term risk for opportunistic infections.  She is interested in this because the schedule convenience and other reasons.  She wants to meet with the pharmacist about this.  She wants to go ahead.  She is particular interested in the steroid sparing effects of this.    In terms of other issues - She has new cardiologist Dr Krasowski cardiac MRI was recommended with this being changed to PET scan because of dye allergy.  She has had a ZIO monitor but the results are pending.  Reviewed the external records on this.   OV 05/16/2023  Subjective:  Patient ID: Ashley [place], female , DOB: [birth year] , age 66 y.o. , MRN: [id number] , ADDRESS: [address] [place] [address] PCP Micheal Wolm ORN, MD Patient Care Team: Micheal Wolm ORN, MD as PCP - General  This Provider for this visit: Treatment Team:  Attending Provider: Geronimo Amel, MD    05/16/2023 -   Chief Complaint  Patient presents with   Follow-up    F/up on sarcoidosis     HPI Ashley [place] 48 y.o. -returns for follow-up of her sarcoidosis.  After the last visit started process for TNF alpha blockade.  However pharmacy has not been able to get in touch with her.  She tells me that she is not interested in infliximab because of the side effect  profile.  Therefore she does not want to do it.  In the interim she did see primary care on 04/23/2023 because of boyfriend had positive QuantiFERON gold test in March 2023.  Was checked again by the primary care on 04/23/2023 and again negative.  She did see  cardiology on 04/29/2023.  Appears she did have a TEE and the mitral regurgitation is only mild.  Pro BNP was checked and this was normal.  Cardiology is working towards getting a PET scan in the presence of allergy to MRI dye.  She told me this is scheduled next week.  The main issue is that week or 2 after last visit she picked up her skin lesion on her right dorsum that was very suspicious for sarcoid.  She did see dermatology.  Fluticasone  did not resolve it so she went up on the prednisone  to 20 mg/day and then this resolved it.  This happened approximately a month ago when she was taking 20 mg/day.  For the last 1 week she is on back on 5 mg baseline.  Currently she just has mild cough and she is able to tolerate it.  She is not interested in methotrexate  or clinical trials or infliximab.  Overall she deems this all stable.    OV 06/28/2024  Subjective:  Patient ID: Ashley Henry, female , DOB: 1976/07/17 , age 45 y.o. , MRN: [id number] , ADDRESS: [address] [place] [address] PCP Micheal Wolm ORN, MD Patient Care Team: Micheal Wolm ORN, MD as PCP - General  This Provider for this visit: Treatment Team:  Attending Provider: Geronimo Amel, MD    06/28/2024 -   Chief Complaint  Patient presents with   Cough    Pt states breathing has been the same since LOV, pt stated skin lesion are still occurring  SOB exertion while walking or fragrance Prod cough ( yellow)     Type of visit: Video Virtual Visit Identification of patient Ashley Henry with 1976-06-05 and MRN [id number] - 2 person identifier Risks: Risks, benefits, limitations of telephone visit explained. Patient understood and verbalized agreement to proceed Anyone else on call: just patient Patient location: her office This provider location: [address], [address]; [place]; [place] [address]. [facility]. [phone]   HPI Ashley Henry 48 y.o. -presents for follow-up.  Is a video  visit.  Last visit was over a year ago.  She remains on prednisone  5 mg/day.  In the interim there are no new health issues she states her respiratory status is same.  She continues to have skin lesions.  She is still not interested in methotrexate .  I did give her an update on the literature where the field is moving towards methotrexate  as primary treatment because of safety profile over prednisone .  Nevertheless she feels methotrexate  is not in her best interest.  I respect her sentiment about this.  She has upcoming CT scan of the chest and November 2025.  She wants to keep this.  Last chest imaging was a cardiac PET/CT a year ago.  She does want to keep the CT scan so she can have an understanding of her sarcoid.  She is willing to have a pulmonary function test in 6 months.  Otherwise no new issues.   NM PET CARDIAC 07/16/23 IMPRESSION: Diffuse reticulonodular interstitial thickening in both lungs, consistent with an atypical inflammatory or infectious process. Differential diagnosis includes pulmonary sarcoidosis.       FDG  uptake was not observed. LV perfusion is normal. There is no evidence of infarction.   Left ventricular function is normal. EF: 57%. End diastolic cavity size is normal.   Coronary calcium was absent on the attenuation correction CT images.   FDG uptake findings are inconsistent with active myocardial inflammation/sarcoidosis. Electronically Signed   By: Norleen DELENA Kil M.D.   On: 07/16/2023 11:08    PFT     Latest Ref Rng & Units 02/20/2023    3:38 PM 05/14/2022    3:33 PM 01/25/2022   12:10 PM  PFT Results  FVC-Pre L 1.98  1.95  2.30   FVC-Predicted Pre % 54  53  76   FVC-Post L   2.16   FVC-Predicted Post %   71   Pre FEV1/FVC % % 56  63  88   Post FEV1/FCV % %   85   FEV1-Pre L 1.11  1.22  2.02   FEV1-Predicted Pre % 37  41  82   FEV1-Post L   1.83   DLCO uncorrected ml/min/mmHg 12.47   15.20   DLCO UNC% % 58   70   DLCO corrected ml/min/mmHg 12.47    15.20   DLCO COR %Predicted % 58   70   DLVA Predicted % 116   122   TLC L   3.17   TLC % Predicted %   62   RV % Predicted %   54        LAB RESULTS last 96 hours No results found.       has a past medical history of Abnormal CT scan of lung (07/13/2020), Abnormal finding on imaging of liver (07/13/2020), Anemia, BACK PAIN, UPPER (07/27/2010), BRONCHITIS, ACUTE (07/16/2010), CELLULITIS, GREAT TOE (01/08/2010), Enlarged uterus (05/13/2011), Essential hypertension (09/07/2009), HEADACHE (08/03/2010), HYPERLIPIDEMIA (10/11/2009), Hypertension (2010), INGROWN NAIL (02/08/2010), Iron  deficiency anemia due to chronic blood loss (06/20/2014), Kidney stone (10/30/2015), OBESITY (10/11/2009), PARESTHESIA (08/03/2010), Pre-diabetes, Renal disorder, SINUSITIS, ACUTE (12/07/2009), SORE THROAT (02/14/2010), and Vitamin D  deficiency (07/13/2020).   reports that she has never smoked. She has never used smokeless tobacco.  Past Surgical History:  Procedure Laterality Date   BREAST REDUCTION SURGERY  1999   BUBBLE STUDY  05/16/2022   Procedure: BUBBLE STUDY;  Surgeon: Okey Vina GAILS, MD;  Location: [facility] ENDOSCOPY;  Service: Cardiovascular;;   CESAREAN SECTION  2004   Cysts removed from chest wall     EYE SURGERY Left    MASS EXCISION N/A 04/14/2018   Procedure: EXCISION OF CHEST WALL MASS;  Surgeon: Vernetta Berg, MD;  Location: [facility];  Service: General;  Laterality: N/A;   TEE WITHOUT CARDIOVERSION N/A 05/16/2022   Procedure: TRANSESOPHAGEAL ECHOCARDIOGRAM (TEE);  Surgeon: Okey Vina GAILS, MD;  Location: [facility] ENDOSCOPY;  Service: Cardiovascular;  Laterality: N/A;    Allergies  Allergen Reactions   Multihance  [Gadobenate] Nausea And Vomiting   Collagenase Hives   Collagenase Clostridium Histolyticum Hives   Aspirin Rash    Immunization History  Administered Date(s) Administered   PFIZER(Purple Top)SARS-COV-2 Vaccination 12/30/2020, 01/20/2021, 06/30/2021   PPD Test 03/29/2020    Td 09/04/2009   Td (Adult),5 Lf Tetanus Toxid, Preservative Free 09/04/2009   Tdap 09/04/2009    Family History  Problem Relation Age of Onset   Hypertension Mother    Diabetes Mother    Hypertension Father    Stomach cancer Paternal Grandmother    Stomach cancer Maternal Uncle    Heart disease Maternal Uncle    Lung  cancer Maternal Grandfather    Prostate cancer Maternal Uncle    Thyroid disease Maternal Uncle      Current Outpatient Medications:    albuterol  (PROVENTIL ) (2.5 MG/3ML) 0.083% nebulizer solution, Take 3 mLs (2.5 mg total) by nebulization every 6 (six) hours as needed for wheezing or shortness of breath., Disp: 75 mL, Rfl: 5   albuterol  (VENTOLIN  HFA) 108 (90 Base) MCG/ACT inhaler, Inhale 2 puffs into the lungs every 6 (six) hours as needed for wheezing or shortness of breath., Disp: 18 g, Rfl: 2   amLODipine  (NORVASC ) 5 MG tablet, TAKE 1 TABLET(5 MG) BY MOUTH DAILY, Disp: 90 tablet, Rfl: 1   benzonatate  (TESSALON ) 100 MG capsule, Take 1 capsule every 8 hours as needed for cough, Disp: 60 capsule, Rfl: 2   BEPREVE  1.5 % SOLN, INSTILL 1 DROP IN BOTH EYES DAILY, Disp: 10 mL, Rfl: 5   budesonide -formoterol  (SYMBICORT ) 80-4.5 MCG/ACT inhaler, Inhale 2 puffs into the lungs 2 (two) times daily., Disp: 1 each, Rfl: 12   cyclobenzaprine  (FLEXERIL ) 5 MG tablet, TAKE 1 TABLET BY MOUTH EVERY 8 HOURS AS NEEDED FOR MUSCLE SPASM, Disp: 60 tablet, Rfl: 2   Desoximetasone  (TOPICORT ) 0.25 % ointment, APPLY TOPICALLY TO AFFECTED AREA TWICE DAILY, Disp: 30 g, Rfl: 1   hydrochlorothiazide  (HYDRODIURIL ) 25 MG tablet, TAKE 1 TABLET(25 MG) BY MOUTH DAILY, Disp: 90 tablet, Rfl: 1   hydrocortisone  butyrate (LOCOID ) 0.1 % CREA cream, Apply 1 Application topically 2 (two) times daily., Disp: 45 g, Rfl: 2   losartan  (COZAAR ) 50 MG tablet, TAKE 1 TABLET(50 MG) BY MOUTH DAILY, Disp: 90 tablet, Rfl: 0   Norgestimate -Eth Estradiol  (TRI-LO-MILI) 0.18/0.215/0.25 MG-25 MCG TABS, Take 1 tablet by mouth  daily., Disp: 28 tablet, Rfl: 1   omeprazole  (PRILOSEC) 40 MG capsule, TAKE 1 CAPSULE(40 MG) BY MOUTH DAILY, Disp: 90 capsule, Rfl: 3   predniSONE  (DELTASONE ) 5 MG tablet, TAKE 1 TABLET(5 MG) BY MOUTH DAILY WITH BREAKFAST, Disp: 90 tablet, Rfl: 1   ferrous sulfate (SLOW IRON ) 160 (50 Fe) MG TBCR SR tablet, Take 1 tablet (160 mg total) by mouth daily. (Patient not taking: Reported on 06/28/2024), Disp: 30 tablet, Rfl: 3   Norgestimate -Ethinyl Estradiol  Triphasic (TRI-LO-SPRINTEC) 0.18/0.215/0.25 MG-25 MCG tab, Take 1 tablet by mouth daily. (Patient not taking: Reported on 06/28/2024), Disp: 28 tablet, Rfl: 3      Objective:   Vitals:   06/28/24 0940  Weight: 260 lb (117.9 kg)  Height: 5' 4 (1.626 m)    Estimated body mass index is 44.63 kg/m as calculated from the following:   Height as of this encounter: 5' 4 (1.626 m).   Weight as of this encounter: 260 lb (117.9 kg).  @WEIGHTCHANGE @  Filed Weights   06/28/24 0940  Weight: 260 lb (117.9 kg)     Physical Exam   General: No distress. Looks wel O2 at rest: no Cane present: no Sitting in wheel chair: no Frail: no Obese: no Neuro: Alert and Oriented x 3. GCS 15. Speech normal Psych: Pleasant Resp:  , No overt respiratory distress       Assessment/     Assessment & Plan Sarcoidosis  Chronic cough  Hypertension, unspecified type  Anemia, unspecified type    PLAN Patient Instructions  Sarcoidosis Chronic cough Dyspnea on exertion Mediastinal adenopathy Current use of chronic steroids  - Last seen August 2024.  And since then subjectively stable on prednisone  5 mg/day.  You have upcoming CT scan of the chest November 2025   Plan -  Keep CT scan of the chest appointment for November 2025 and we will inform you of the results -Do pulmonary function test in 6 months spirometry and DLCO -Continue prednisone  5 mg/day - Continue Symbicort  empiric inhaler therapy schedule -Respect no interest in interested  in methotrexate , or infliximab or clinical trials due to safety concern     Pigmented skin lesions  -Very suspicious of sarcoidosis.  This appears to be an ongoing problem even on video visit 06/28/2024   Plan - According to dermatology  Rheumatoid factor positive -mildly positive in 2021 and persistent though March 2024 Associted arthralgia  -  not reported as a problem on this visit October 2025  Plan  - monitor -  - at some point can decide on rheumatology referral   Abnormal CT of liver along with MRI of the liver and spleen in 2021 ad in CT March 2023   Plan - Refer to gastroenterology some time in future [deferred in the prior visits]   HY   Follow-up - 6 months 15-minute visit after pulmonary function testing.    FOLLOWUP    Return in about 6 months (around 12/27/2024) for 15 min visit, after Spiro and DLCO, Face to Face Visit.    SIGNATURE    Dr. Dorethia Cave, M.D., F.C.C.P,  Pulmonary and Critical Care Medicine Staff Physician, [facility] Director - Interstitial Lung Disease  Program  Pulmonary Fibrosis [facility] at [facility] [place], [place], [address]  Pager: [phone], If no answer or between  15:00h - 7:00h: call [phone] Telephone: [phone]  10:11 AM 06/28/2024

## 2024-06-28 ENCOUNTER — Encounter: Payer: Self-pay | Admitting: Internal Medicine

## 2024-06-28 ENCOUNTER — Telehealth (INDEPENDENT_AMBULATORY_CARE_PROVIDER_SITE_OTHER): Admitting: Internal Medicine

## 2024-06-28 VITALS — Ht 64.0 in | Wt 260.0 lb

## 2024-06-28 DIAGNOSIS — D869 Sarcoidosis, unspecified: Secondary | ICD-10-CM | POA: Diagnosis not present

## 2024-06-28 DIAGNOSIS — M059 Rheumatoid arthritis with rheumatoid factor, unspecified: Secondary | ICD-10-CM

## 2024-06-28 DIAGNOSIS — D649 Anemia, unspecified: Secondary | ICD-10-CM | POA: Diagnosis not present

## 2024-06-28 DIAGNOSIS — I10 Essential (primary) hypertension: Secondary | ICD-10-CM

## 2024-06-28 DIAGNOSIS — R053 Chronic cough: Secondary | ICD-10-CM

## 2024-06-28 DIAGNOSIS — R932 Abnormal findings on diagnostic imaging of liver and biliary tract: Secondary | ICD-10-CM

## 2024-07-21 ENCOUNTER — Other Ambulatory Visit: Payer: Self-pay | Admitting: Family Medicine

## 2024-07-30 ENCOUNTER — Ambulatory Visit (HOSPITAL_COMMUNITY)
Admission: RE | Admit: 2024-07-30 | Discharge: 2024-07-30 | Disposition: A | Discharge status: Home / Self Care | Source: Ambulatory Visit | Attending: Internal Medicine | Admitting: Internal Medicine

## 2024-07-30 ENCOUNTER — Encounter (HOSPITAL_COMMUNITY): Payer: Self-pay

## 2024-07-30 DIAGNOSIS — Z7952 Long term (current) use of systemic steroids: Secondary | ICD-10-CM | POA: Insufficient documentation

## 2024-07-30 DIAGNOSIS — R053 Chronic cough: Secondary | ICD-10-CM | POA: Diagnosis present

## 2024-07-30 DIAGNOSIS — L819 Disorder of pigmentation, unspecified: Secondary | ICD-10-CM | POA: Insufficient documentation

## 2024-07-30 DIAGNOSIS — R0609 Other forms of dyspnea: Secondary | ICD-10-CM | POA: Insufficient documentation

## 2024-07-30 DIAGNOSIS — D869 Sarcoidosis, unspecified: Secondary | ICD-10-CM | POA: Insufficient documentation

## 2024-08-01 ENCOUNTER — Ambulatory Visit: Payer: Self-pay | Admitting: Internal Medicine

## 2024-08-01 NOTE — Progress Notes (Signed)
 CT scan shows improvement of sarcoid. We should discuss reducing steroid dose. Please give her an appt - non urgent/video ok  Xxxxx  IMPRESSION: 1. Mediastinal and pulmonary parenchymal findings of sarcoid. Overall, pulmonary parenchymal involvement has improved somewhat and is more fibrotic in appearance. Findings are suggestive of an alternative diagnosis (not UIP) per consensus guidelines: Diagnosis of Idiopathic Pulmonary Fibrosis: An Official ATS/ERS/JRS/ALAT Clinical Practice Guideline. Am JINNY Honey Crit Care Med Vol 198, Iss 5, [phone], May 17 2017. 2. Punctate right renal stone. 3. Enlarged pulmonic trunk, indicative of pulmonary arterial hypertension. 4.  Aortic atherosclerosis (ICD10-I70.0).     Electronically Signed   By: Newell Eke M.D.   On: 07/30/2024 11:50

## 2024-08-02 NOTE — Telephone Encounter (Signed)
 Patient scheduled for video visit.

## 2024-08-03 NOTE — Patient Instructions (Signed)
 Sarcoidosis Chronic cough Dyspnea on exertion Mediastinal adenopathy Current use of chronic steroids   - Shortness of breath and PFT stable on prednisone  5 mg per day.   Some mild cough - Noted and respect reluctance for TNF alpha blockade infliximab because of side effect concern. -PET scan suggestibility but also respect fact that he would like to have another scan done -Noted desire to improve cough with the help of inhaled steroids.   Plan -Continue prednisone  5 mg/day - Do high-resolution CT scan of the chest supine and prone in November 2025 - Try Symbicort  empiric inhaler therapy schedule -Respect no inte -Not interested in methotrexate , or infliximab or clinical trials due to safety concern   HYPERTENSION 180/120 on 12/06/2022 and 132/90 on 02/20/2023 Elevated HgbA1c Jan 2024 and March 2024  - likely prednisone  contributing but hemoglobin A1c recently 6.5  Plan -According to primary care physician   Anemia, unspecified type -noted in October 2021. Persistent on May 2024; hgb 9.9  Seems like Iron  Deficiency anemia. Ongoing May 2024. Not taking iron  pills per personal choice  Plan -monitor and support from primary care physician   Pigmented skin lesions  -Very suspicious of sarcoidosis.  Flared up in the right dorsum of the hand after recent visit June 2024 and again has flared up on this visit February 2025  Plan - Referred to [facility] dermatology  Rheumatoid factor positive -mildly positive in 2021 and persistent though March 2024 Associted arthralgia  - can be sarcoid arthritis versus RA but glad joint pain improved  and not much this visit 12/06/2022   Plan  - monitor -  - at some point can decide on rheumatology referral   Abnormal CT of liver along with MRI of the liver and spleen in 2021 ad in CT March 2023   Plan - Refer to gastroenterology some time in future   HY   Follow-up - November 2025 face-to-face visit but after CT scan of the  chest

## 2024-08-03 NOTE — Progress Notes (Unsigned)
 OV 06/19/2020  Subjective:  Patient ID: Ashley Henry, female , DOB: 05-18-1976 , age 48 y.o. , MRN: [id number] , ADDRESS: [address] [place] [address]  PCP Micheal Wolm ORN, MD Attending : Treatment Team:  Attending Provider: Geronimo Amel, MD    06/19/2020 -   Chief Complaint  Patient presents with   Consult    Patient is here for a productive/dry cough that she has had for several months with brown/yellow thivk sputum. Shortness of breath with exertion, stong smells, to hot.      Ashley Ashley Henry 48 y.o. -referred by primary care physician Burchette, Wolm ORN, MD.  Patient is a psychotherapist.  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  Her 3 maternal uncles, her brother and her maternal aunt all have sarcoidosis.  Her uncles are deceased although from other reasons.  Her brother and her aunt are still living.  She tells me that in the last 6 months he has had insidious onset of chronic cough, shortness of breath, night sweats and chills.  These have persisted.  This is despite having an intentional 8 pound weight loss during this time.  She says cough is made worse by talking or exertion.  Similarly shortness of breath is made worse by exertion.  Also made worse by talking through the mask during counseling sessions.  Both are relieved by rest.  Cough is also relieved by not talking.  She does have nocturnal awakening with cough chest tightness and wheezing.  She also reports right infra axillary right upper quadrant pain with coughing.  She does feel chest tightness deep inside the chest.  She had CT scan of the chest that I personally visualized.  Shows mediastinal lymphadenopathy consistent with stage I sarcoidosis.  CT scan abdomen cuts on the chest CT show possible evidence of sarcoidosis in the liver.  MRI has been recommended.  There are associated palpitations and night sweats.  Cough is associated with sputum productin     IMPRESSION:  1.  Pulmonary parenchymal and thoracic nodal findings which are consistent with sarcoidosis. 2. Heterogeneous density throughout the liver. Possibly heterogeneous steatosis. Hepatic involvement of sarcoidosis could look similar. If there are right upper quadrant symptoms or abnormal liver function test, pre and post contrast abdominal MRI should be considered.     Electronically Signed   By: Rockey Kilts M.D.   On: 05/29/2020 10:59  ROS - per Ashley  Results for AILAH, BARNA (MRN [id number]) as of 06/19/2020 09:03  Ref. Range 09/26/2017 14:27 10/13/2017 16:54 10/06/2018 14:47  Anti Nuclear Antibody (ANA) Latest Ref Range: NEGATIVE   NEGATIVE   SSA (Ro) (ENA) Antibody, IgG Latest Ref Range: <1.0 NEG AI <1.0 NEG    SSB (La) (ENA) Antibody, IgG Latest Ref Range: <1.0 NEG AI <1.0 NEG       07/13/2020 Follow up : Cough/? Sarcoid /Abnormal CT chest and MRI AB   48 year old female never smoker seen for pulmonary consult June 19, 2020 for 68-month history of chronic cough shortness of breath night sweats and abnormal CT chest suspicious for sarcoidosis.  Patient has a very strong family history of sarcoidosis.  Patient presents for a 3-week follow-up.  Patient was seen last visit for pulmonary consult for a 53-month history of cough shortness of breath night sweats and chills.  She was seen by her primary care provider.  Subsequent CT chest May 29, 2020 showed pulmonary nodularity, possible bilateral bilateral hilar  adenopathy, liver abnormalities.  This was suspicious for possible sarcoidosis.  Lab work last visit showed an elevated ACE level at 82 and a high sed rate at greater than 130.  Her rheumatoid factor was positive but CCP was negative.  INR was slightly elevated at 1.1.  Patient anemia was stable with hemoglobin at 9.3.  Her platelets were normal.  Hepatic function testing was normal.  Patient was set up for an MRI abdomen that showed abnormalities with a hyperenhancing area in the medial  segment of the left hepatic lobe hepatic nodularity.  And splenic lesions.  There was suspicion for portal hypertension with possible varices versus a distal esophageal hyperenhancement.  There was no signs of steatosis. Patient has significant fatigue low energy decreased activity tolerance cough joint pains.  She has had intermittent rashes but has none currently.  She has had a recent eye exam with no reported abnormalities. We discussed all of her test and lab results. Has very strong family history of sarcoidosis with 3 maternal uncles, her brother and a maternal aunt that all have sarcoidosis. Patient has had intentional weight loss says that she has been on a diet and is down greater than 89 pounds. She denies any hemoptysis bloody stools hematemesis, bruising.  We had a long discussion regarding her abnormalities in her lab work and recent MRI of her liver and spleen.  We discussed the need to proceed with probable bronchoscopy with biopsy and referral to GI for consideration for further work-up of liver involvement and abnormalities. Unfortunately patient's medical insurance is going to stop July 16, 2020.  She declines flu shot and Covid vaccines  Patient works independently as a airline pilot.    TEST/EVENTS :  Chest x-ray September 29, 2017 clear lungs no acute process  Renal CT April 2017-no focal abnormalities noted in the liver, spleen or pancreas, nonobstructive left nephrolithiasis, moderate right hydroureter nephrosis  CT chest without contrast May 29, 2020 showed perilymphatic, peribronchovascular and subpleural distribution of pulmonary nodularity throughout right apical nodule 5 mm and nodularity along the left major fissure measuring 9 mm.  Heterogeneous density throughout the liver.,  Possible mild bilateral hilar adenopathy  MR abdomen July 08, 2020 hyperenhancing area in the medial segment of the left hepatic lobe, background of nodular hepatic contours and  hepatic heterogeneity atypical for sarcoid associated lesion given arterial enhancement other areas more typical for sarcoid but remain nonspecific.,  Splenic lesions noted.  Potential portal hypertension with varices versus distal esophageal hyperenhancement, no signs of steatosis  Labs June 19, 2020: QuantiFERON gold negative, Sjogren's negative, LFTs normal, rheumatoid factor positive, CCP negative, INR 1.1, allergy profile negative, IgE 67, Anti-DNA antibody negative, hepatic panel normal, platelets normal, WBC 7.1 ACE level elevated 82, sed rate greater than 130, vitamin D  8, hemoglobin 9.3 hematocrit 28  2D echo July 07, 2020 EF 55 to 60%, mild LVH, pulmonary artery pressure normal  Previous iron  levels were noted low  HIV 2019 neg.   OV 11/22/2021  Subjective:  Patient ID: Ashley Henry, female , DOB: [birth year] , age 50 y.o. , MRN: [id number] , ADDRESS: [address] [place] [address] PCP Micheal Wolm ORN, MD Patient Care Team: Micheal Wolm ORN, MD as PCP - General  This Provider for this visit: Treatment Team:  Attending Provider: Geronimo Amel, MD    11/22/2021 -   Chief Complaint  Patient presents with   Follow-up    Pt states she is coughing a lot each day and also  states that her skin has been breaking out. States first thing in the morning she will cough up a lot of phlegm. States that she does have increased SOB especially when exercising.     ICD-10-CM   1. Sarcoidosis  D86.9     2. Chronic cough  R05.3     3. Dyspnea on exertion  R06.09     4. Mediastinal adenopathy  R59.0     5. Vitamin D  deficiency  E55.9     6. Anemia, unspecified type  D64.9     7. Rheumatoid factor positive  R76.8     8. Abnormal CT of liver  R93.2     9. Pigmented skin lesions  L81.9        Ashley Ashley Henry 48 y.o. -returns for follow-up.  Last seen personally in 2021.  At that time the concern was all his symptoms are pointing toward sarcoidosis.  She  then saw a nurse practitioner Madelin Passy in October 2021.  She was confirmed to have sarcoidosis clinically based on high angiotensin-converting enzyme, African-American ethnicity, age and symptoms and CT scan findings of the lung.  She was then prescribed prednisone  for 3 months she says during this time all the symptoms resolved.  She did not have insurance so did not do any further work-up.  She says then early last year she ran out of prednisone  and since then having significant symptoms.  The symptoms include significant amount of night sweats, shortness of breath and wheezing.  Also severe cough day and night.  In the early morning the cough is productive with yellow sputum but rest of the day the cough is dry.  She works as a airline pilot and as she talks the cough is even worse.  She showed a voice recording of the cough and there is clear laryngeal component to the cough.  Symptoms are rated as severe.  Cough not responding to Tessalon  cough.  During this time she had skin lesions and arthralgia.  The skin lesions on the hands forehead.  There is also drying of the palms.  There is arthralgia diffusely.  Vitamin D  deficiency: This was diagnosed in 2021.  Not on treatment currently  Anemia: This was diagnosed in 2021.  Not on treatment currently   Liver lesion seen on MRI along with splenic lesions: These were reported as atypical for sarcoid.  She never saw a GI  Skin lesions: This is new since last visit.  She is open to seeing dermatologist.      OV 12/18/2021  Subjective:  Patient ID: Ashley Henry, female , DOB: [birth year] , age 34 y.o. , MRN: [id number] , ADDRESS: [address] [place] [address] PCP Micheal Wolm ORN, MD Patient Care Team: Micheal Wolm ORN, MD as PCP - General    12/18/2021 -this video visit is to review test results done due to concern of sarcoidosis.   Ashley Ashley Henry 48 y.o. -since her last visit a few weeks ago she continues to have  the same symptoms of arthralgia and shortness of breath.  And skin lesions.  Referred her to the dermatologist and GI because of concerns of splenic lesions.  She has not yet seen them.  At this point in time she is focused on getting through symptom relief.  We did do investigations and they are as follows  CT scan of the chest: Shows worsening sarcoidosis.  I visualized the film and also showed it to the video  Pulmonary function test: Not yet done  Blood work: Shows worsening angiotensin-converting enzyme level to greater than 180.  Persistently high ESR.SABRA  Rheumatoid factor continues to be trace positive.  Continues to be iron  deficient anemic with a hemoglobin 9.9 g% which is baseline since 2014.  She says primary care physician has not yet addressed this.  IMPRESSION: 1. There has been dramatic increase in parenchymal lung involvement with extensive progressive perilymphatic nodularity, as detailed above, compatible with worsening sarcoidosis. 2. Multiple large low-attenuation splenic lesions, nonspecific, but likely reflective of sarcoid involvement in the spleen. 3. Dilatation of the pulmonic trunk (3.7 cm in diameter), concerning for pulmonary arterial hypertension. 4. Aortic atherosclerosis.   Aortic Atherosclerosis (ICD10-I70.0).     Electronically Signed   By: Toribio Aye M.D.   On: 12/12/2021 07:41   01/28/2022 -   Chief Complaint  Patient presents with   Follow-up    PFT performed 01/25/22. Pt states she has been doing okay since last visit and denies any complaints.     Ashley Ashley Henry 48 y.o. -last office visit April 2023 on video.  Presents with her daughter who is at the bedside.  Diagnosis worsening sarcoidosis symptomatic given.  Commenced on prednisone .  I wrote for prednisone  taper of 40 mg x 2 weeks and then 20 mg x 2 weeks.  The pharmacist informed that I skipped a dose of 30 mg for 2 weeks.  Therefore she took 40 mg for 2 weeks and then 30 mg for 2  weeks.  She is currently on 20 mg daily and she is finished 1 week of that.  She wants to extend the taper and go to 10 mg/day and then continue that per instructions.  The prednisone  is really helped shortness of breath is improved cough is improved.  Her skin rash in the face and hands have improved.  Nevertheless she is having prednisone  side effects of increased hunger 10 pound weight gain decreased sleep increased alertness.  Also facial puffiness.  Nevertheless she is continuing with the prednisone  and she is okay doing that.  We did get an echocardiogram.  Previous echocardiogram 2021 was normal.  Currently is reporting moderate to severe mitral regurgitation and rater than 4 cm aortic root dilatation.  I did inform her of these findings.  I also passed a message on to Dr. Monetta her cardiologist.  Pulmonary function shows mild restriction with mild reduction in diffusion capacity.    OV 05/14/2022  Subjective:  Patient ID: Ashley Henry, female , DOB: 1975-09-24 , age 42 y.o. , MRN: [id number] , ADDRESS: [address] [place] [address] PCP Micheal Wolm ORN, MD Patient Care Team: Micheal Wolm ORN, MD as PCP - General  This Provider for this visit: Treatment Team:  Attending Provider: Geronimo Amel, MD    05/14/2022 -   Chief Complaint  Patient presents with   Follow-up    PFT performed today.  Pt states she is about the same compared to last visit.   Follow-up pulmonary sarcoidosis last CT scan March 2023  Ashley Ashley Henry 48 y.o. -returns for follow-up.  At the last visit her symptoms and the skin lesions were better after prednisone  dosage.  We started tapering the prednisone .  Today she presents for follow-up.  She is now on 10 mg/day prednisone  but she says with the taper cough is worse shortness of breath is worse wheezing is worse.  In addition skin lesions are also getting worse.  She  is still gaining weight though she states she is gained another 10  pounds of weight on the current 10 mg of prednisone .  She is frustrated by the worsening symptoms.  She did a pulmonary function test today.  This shows worsening of FVC and FEV1 however she did have significant amount of cough which is part of her symptoms from the sarcoid.  Therefore the results are somewhat unreliable.  Her daughter is here with her today.  She had normal G6PD testing are slightly high back in May 2023 March 2023 the QuantiFERON gold was negative. She has iron  deficiency anemia when last checked in March 2023  She has mitral valve regurgitation and she saw Dr. Monetta and a TEE is pending very shortly.  She does admit to history of palpitations.  She does not recall having MRI of the heart or some kind of arrhythmia monitoring program.  I have written to Dr. Monetta about this.  We discussed the fact that she has progressive pulm sarcoidosis symptoms and the prednisone  is causing side effects and also in addition with the prednisone  taper she is having more symptoms.  Last visit we discussed the second line methotrexate  therapy.  She is open to the idea.  We went over the side effects in detail including common and rare side effects and the requirement for significant amount of blood monitoring.  She is agreeable to this.  We discussed referral to [facility] or [facility] for clinical trial participation by [employer] but she is not interested.  We discussed about seeing Dr. Slater Staff in our practice resident sarcoid expert but at this point in time she wants to hold off.       OV 08/15/2022  Subjective:  Patient ID: Ashley Henry, female , DOB: July 16, 1976 , age 59 y.o. , MRN: [id number] , ADDRESS: [address] [place] [address] PCP Micheal Wolm ORN, MD Patient Care Team: Micheal Wolm ORN, MD as PCP - General  This Provider for this visit: Treatment Team:  Attending Provider: Geronimo Amel, MD  Follow-up pulmonary sarcoidosis last CT scan March  2023  08/15/2022 -   Chief Complaint  Patient presents with   Follow-up    Doing good with prednisone  currently     Ashley Ashley Henry 48 y.o. -returns for follow-up.  At this point in time she is back on prednisone  10 mg/day.  She finds this dose to be helpful for her.  Early in the morning she has severe cough that last 30 to 60 minutes and it has white sputum.  Then she takes a prednisone  10 mg/day and the cough goes away.  Overall quality of life is better no shortness of breath rest of the day there is no cough.  Her skin lesions in the face have resolved.  The skin lesions in the hands do persist but they are improved.  She is open to getting a dermatology referral right now.  She did have mitral regurgitation on the 2D echo when she had a TEE and it is only mild.  This was in August 2023 and I reviewed that result.  At this point in time she is content taking prednisone  10 mg/day for her sarcoid.  She does not want methotrexate .  In the past she declined to clinical trials.  Review of the labs indicate that she had anemia in March 2023.  I will will offer her repeat CBC testing.  We discussed vaccination she has had a COVID-vaccine but  not her flu.  She has never had a flu shot and does not intend to.     OV 09/24/2022  Subjective:  Patient ID: Ashley Henry, female , DOB: 08-Mar-1976 , age 35 y.o. , MRN: [id number] , ADDRESS: [address] [place] [address] PCP Micheal Wolm ORN, MD Patient Care Team: Micheal Wolm ORN, MD as PCP - General  This Provider for this visit: Treatment Team:  Attending Provider: Geronimo Amel, MD    09/24/2022 -   Chief Complaint  Patient presents with   Follow-up    Breathing has been good,winded with walking.      Ashley Ashley Henry 48 y.o. -returns for follow-up with her daughter.  She is giving the history.  She tells me since last visit she has had 2 trips to [place] 1 time in November 2023 and the other 1 just this past  weekend.  She is having insidious onset of dyspnea on exertion relieved by rest.  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  Is been going on for 3 or 4 weeks relieved by rest.  She is also reporting bilateral ankle swelling around the same time.  Particularly with sitting.  She went [place] by car and it was definitely present.  Her baseline cough early in the morning for 30 minutes is unchanged.  This cough is made worse by perfume and smoke but now also present with the new onset of dyspnea.  She does have very streaky mild hemoptysis and thick yellow sputum but this is baseline.  Her hemoglobin was last checked a year ago and she was anemic.  I reviewed this result again.  She is wondering about getting a repeat CT scan of the chest to reassess her sarcoid.  She had a TEE in summer 2023 and she only had mild mitral valve prolapse.  I reviewed this result again.     OV 12/06/2022  Subjective:  Patient ID: Ashley Henry, female , DOB: [birth year] , age 83 y.o. , MRN: [id number] , ADDRESS: [address] [place] [address] PCP Micheal Wolm ORN, MD Patient Care Team: Micheal Wolm ORN, MD as PCP - General  This Provider for this visit: Treatment Team:  Attending Provider: Geronimo Amel, MD    12/06/2022 -   Chief Complaint  Patient presents with   Follow-up    Sarcoidosis f/up     Ashley Ashley Henry 48 y.o. -returns for follow-up.  She has not had a pulmonary function test today.  She states that she is on 10 mg prednisone  taking it compliant.  Shortness of breath is improved although there is residual shortness of breath.  Ankle edema from previous visit is also resolved.  She is walking and this is helping her.  Her skin lesions are also improved.  However there are side effects from the prednisone  her hemoglobin A1c in January 2024 is gone up to greater than 7.  Her blood pressure today is elevated 180/120.  This the  first time it has been elevated.  She is not on antihypertensives.  Iron  deficiency anemia continues.  She states she is not taking iron  pills because she does not want to.  She is also got diminished sleep 25% of the time.  She is open to reducing her prednisone  to 7.5 mg/day.  Her cardiologist Dr. Monetta is retiring.  She is upcoming cardiology appointment with a new cardiologist  in May 2024.  Otherwise no new issues.         OV 02/20/2023  Subjective:  Patient ID: Ashley Henry, female , DOB: 18-Aug-1976 , age 43 y.o. , MRN: [id number] , ADDRESS: [address] [place] [address] PCP Micheal Wolm ORN, MD Patient Care Team: Micheal Wolm ORN, MD as PCP - General  This Provider for this visit: Treatment Team:  Attending Provider: Geronimo Amel, MD    02/20/2023 -   Chief Complaint  Patient presents with   Follow-up    SOB with exertion.  Cough and wheeze persistent.     Ashley Ashley Henry 48 y.o. -follow-up pulmonary sarcoidosis on prednisone   Returns for follow-up.  Presents with her daughter Tawney Vanorman.  She is now on a reduced dose of prednisone  7.5 mg/day.  So far there is no recurrence of rash.  In the past reducing prednisone  is caused flareup of sarcoid.  However when prednisone  is increased her blood pressure is high and her blood sugar goes up.  Most recent hemoglobin A1c 7.2.  She not on any treatment for this.  She ideally wants to come off the prednisone  because this is prednisone  related hyperglycemia.  She is agreed to drop her prednisone  to 5 mg/day.  She is gotten hypertensive and since last visit she is on hydrochlorothiazide  and also Cozaar .  Her blood pressure has improved with a systolic of 132 today.  From a respiratory standpoint her pulmonary function test is stable compared to August 2023 but is still down compared to 1 year ago.  If this is very similar at 1.98 L / 54% and DLCO of 12.47/58%.  We talked about steroid sparing options.  She is  quite interested at this time and infliximab.  We went over methotrexate  again.  She is more fearful of methotrexate  even though this is less immunosuppressive.  This because of the side effect profile and personal experience she has heard from other people.  We went over TNF alpha blockade infliximab.  Went over the side effect profile on up-to-date that includes infusion reactions abdominal symptoms headaches and also long-term risk for opportunistic infections.  She is interested in this because the schedule convenience and other reasons.  She wants to meet with the pharmacist about this.  She wants to go ahead.  She is particular interested in the steroid sparing effects of this.    In terms of other issues - She has new cardiologist Dr Krasowski cardiac MRI was recommended with this being changed to PET scan because of dye allergy.  She has had a ZIO monitor but the results are pending.  Reviewed the external records on this.   OV 05/16/2023  Subjective:  Patient ID: Ashley Henry, female , DOB: Feb 05, 1976 , age 29 y.o. , MRN: [id number] , ADDRESS: [address] [place] [address] PCP Micheal Wolm ORN, MD Patient Care Team: Micheal Wolm ORN, MD as PCP - General  This Provider for this visit: Treatment Team:  Attending Provider: Geronimo Amel, MD    05/16/2023 -   Chief Complaint  Patient presents with   Follow-up    F/up on sarcoidosis     Ashley Ashley Henry 48 y.o. -returns for follow-up of her sarcoidosis.  After the last visit started process for TNF alpha blockade.  However pharmacy has not been able to get in touch with her.  She tells me that she is not interested in infliximab because of the side effect  profile.  Therefore she does not want to do it.  In the interim she did see primary care on 04/23/2023 because of boyfriend had positive QuantiFERON gold test in March 2023.  Was checked again by the primary care on 04/23/2023 and again negative.  She did see  cardiology on 04/29/2023.  Appears she did have a TEE and the mitral regurgitation is only mild.  Pro BNP was checked and this was normal.  Cardiology is working towards getting a PET scan in the presence of allergy to MRI dye.  She told me this is scheduled next week.  The main issue is that week or 2 after last visit she picked up her skin lesion on her right dorsum that was very suspicious for sarcoid.  She did see dermatology.  Fluticasone  did not resolve it so she went up on the prednisone  to 20 mg/day and then this resolved it.  This happened approximately a month ago when she was taking 20 mg/day.  For the last 1 week she is on back on 5 mg baseline.  Currently she just has mild cough and she is able to tolerate it.  She is not interested in methotrexate  or clinical trials or infliximab.  Overall she deems this all stable.       OV 11/07/2023  Subjective:  Patient ID: Ashley Henry, female , DOB: [birth year] , age 68 y.o. , MRN: [id number] , ADDRESS: [address] [place] [address] PCP Micheal Wolm ORN, MD Patient Care Team: Micheal Wolm ORN, MD as PCP - General  This Provider for this visit: Treatment Team:  Attending Provider: Geronimo Amel, MD  Type of visit: Video Virtual Visit Identification of patient Ashley Henry with [birth year] and MRN [id number] - 2 person identifier Risks: Risks, benefits, limitations of telephone visit explained. Patient understood and verbalized agreement to proceed Anyone else on call: just patient Patient location: her home This provider location: [address], [address]; [place]; [place] [address].  Pulmonary Office. [phone]   11/07/2023 -  FU pulmonary sarciid   Ashley Ashley Henry 48 y.o.  - remai pn pred 5mg  per day  -skin lesion hand, figner, forehead.x months  x new since last visit in aug 2024. Somewhat prominent x 3 months. Itchy => has seen derm. Was given steroid ointment (oct 2024) has not folowe dup.  She  does not want to go back to [facility] dermatology.  Will make a referral to [facility] dermatology.  Or [facility].  -  Also cough intermittenly - mild, SAme. No change. Worse ith lot of talking and early AM. Sometime in morning. Reduced by vicks vapr . Alb helps  - DOE - due to mask and going to get her patient. Same no change   CARDIAC PET 07/16/23 - persnally visualized this > and showed it to her and compared to 2023 spring.  The pulmonary infiltrates might be slightly better but definitely not worse.  Obviously it is different type of imaging.  arrative & Impression      FDG uptake was not observed. LV perfusion is normal. There is no evidence of infarction.   Left ventricular function is normal. EF: 57%. End diastolic cavity size is normal.   Coronary calcium was absent on the attenuation correction CT images.   FDG uptake findings are inconsistent with active myocardial inflammation/sarcoidosis.   Electronically signed by Darryle Decent, MD ___________________________________________________________________________________________________________   CLINICAL DATA:  This over-read does not include interpretation  of cardiac or coronary anatomy or pathology. The cardiac PET-CT interpretation by the cardiologist is attached.   COMPARISON:  None Available.   FINDINGS: Diffuse reticulonodular interstitial thickening is seen in the visualized portions of both lungs with predominance in the upper lung fields. This is consistent with an atypical inflammatory or infectious process, and differential diagnosis includes pulmonary by sarcoidosis. No pleural fluid seen.   The visualized portions of the mediastinum and chest wall are unremarkable.   IMPRESSION: Diffuse reticulonodular interstitial thickening in both lungs, consistent with an atypical inflammatory or infectious process. Differential diagnosis includes pulmonary sarcoidosis.     Electronically Signed   By:  Norleen DELENA Kil M.D.   On: 07/16/2023 11:08        OV 08/03/2024  Subjective:  Patient ID: Ashley Henry, female , DOB: Jul 25, 1976 , age 42 y.o. , MRN: [id number] , ADDRESS: [address] [place] [address] PCP Micheal Wolm ORN, MD Patient Care Team: Micheal Wolm ORN, MD as PCP - General  This Provider for this visit: Treatment Team:  Attending Provider: Geronimo Amel, MD    08/03/2024 -  No chief complaint on file.    Ashley Ashley Henry 48 y.o. -    CT Chest data from date: ****  - personally visualized and independently interpreted : *** - my findings are: ***   PFT     Latest Ref Rng & Units 02/20/2023    3:38 PM 05/14/2022    3:33 PM 01/25/2022   12:10 PM  PFT Results  FVC-Pre L 1.98  1.95  2.30   FVC-Predicted Pre % 54  53  76   FVC-Post L   2.16   FVC-Predicted Post %   71   Pre FEV1/FVC % % 56  63  88   Post FEV1/FCV % %   85   FEV1-Pre L 1.11  1.22  2.02   FEV1-Predicted Pre % 37  41  82   FEV1-Post L   1.83   DLCO uncorrected ml/min/mmHg 12.47   15.20   DLCO UNC% % 58   70   DLCO corrected ml/min/mmHg 12.47   15.20   DLCO COR %Predicted % 58   70   DLVA Predicted % 116   122   TLC L   3.17   TLC % Predicted %   62   RV % Predicted %   54        LAB RESULTS last 96 hours No results found.       has a past medical history of Abnormal CT scan of lung (07/13/2020), Abnormal finding on imaging of liver (07/13/2020), Anemia, BACK PAIN, UPPER (07/27/2010), BRONCHITIS, ACUTE (07/16/2010), CELLULITIS, GREAT TOE (01/08/2010), Enlarged uterus (05/13/2011), Essential hypertension (09/07/2009), HEADACHE (08/03/2010), HYPERLIPIDEMIA (10/11/2009), Hypertension (2010), INGROWN NAIL (02/08/2010), Iron  deficiency anemia due to chronic blood loss (06/20/2014), Kidney stone (10/30/2015), OBESITY (10/11/2009), PARESTHESIA (08/03/2010), Pre-diabetes, Renal disorder, SINUSITIS, ACUTE (12/07/2009), SORE THROAT (02/14/2010), and Vitamin D  deficiency  (07/13/2020).   reports that she has never smoked. She has never used smokeless tobacco.  Past Surgical History:  Procedure Laterality Date   BREAST REDUCTION SURGERY  1999   BUBBLE STUDY  05/16/2022   Procedure: BUBBLE STUDY;  Surgeon: Okey Vina GAILS, MD;  Location: [facility] ENDOSCOPY;  Service: Cardiovascular;;   CESAREAN SECTION  2004   Cysts removed from chest wall     EYE SURGERY Left    MASS EXCISION N/A 04/14/2018   Procedure: EXCISION OF CHEST WALL MASS;  Surgeon: Vernetta Berg, MD;  Location: [facility];  Service: General;  Laterality: N/A;   TEE WITHOUT CARDIOVERSION N/A 05/16/2022   Procedure: TRANSESOPHAGEAL ECHOCARDIOGRAM (TEE);  Surgeon: Okey Vina GAILS, MD;  Location: [facility] ENDOSCOPY;  Service: Cardiovascular;  Laterality: N/A;    Allergies  Allergen Reactions   Multihance  [Gadobenate] Nausea And Vomiting   Collagenase Hives   Collagenase Clostridium Histolyticum Hives   Aspirin Rash    Immunization History  Administered Date(s) Administered   PFIZER(Purple Top)SARS-COV-2 Vaccination 12/30/2020, 01/20/2021, 06/30/2021   PPD Test 03/29/2020   Td 09/04/2009   Td (Adult),5 Lf Tetanus Toxid, Preservative Free 09/04/2009   Tdap 09/04/2009    Family History  Problem Relation Age of Onset   Hypertension Mother    Diabetes Mother    Hypertension Father    Stomach cancer Paternal Grandmother    Stomach cancer Maternal Uncle    Heart disease Maternal Uncle    Lung cancer Maternal Grandfather    Prostate cancer Maternal Uncle    Thyroid disease Maternal Uncle      Current Outpatient Medications:    albuterol  (PROVENTIL ) (2.5 MG/3ML) 0.083% nebulizer solution, Take 3 mLs (2.5 mg total) by nebulization every 6 (six) hours as needed for wheezing or shortness of breath., Disp: 75 mL, Rfl: 5   albuterol  (VENTOLIN  HFA) 108 (90 Base) MCG/ACT inhaler, Inhale 2 puffs into the lungs every 6 (six) hours as needed for wheezing or shortness of breath., Disp: 18 g, Rfl:  2   amLODipine  (NORVASC ) 5 MG tablet, TAKE 1 TABLET(5 MG) BY MOUTH DAILY, Disp: 90 tablet, Rfl: 1   benzonatate  (TESSALON ) 100 MG capsule, Take 1 capsule every 8 hours as needed for cough, Disp: 60 capsule, Rfl: 2   BEPREVE  1.5 % SOLN, INSTILL 1 DROP IN BOTH EYES DAILY, Disp: 10 mL, Rfl: 5   budesonide -formoterol  (SYMBICORT ) 80-4.5 MCG/ACT inhaler, Inhale 2 puffs into the lungs 2 (two) times daily., Disp: 1 each, Rfl: 12   cyclobenzaprine  (FLEXERIL ) 5 MG tablet, TAKE 1 TABLET BY MOUTH EVERY 8 HOURS AS NEEDED FOR MUSCLE SPASM, Disp: 60 tablet, Rfl: 2   Desoximetasone  (TOPICORT ) 0.25 % ointment, APPLY TOPICALLY TO AFFECTED AREA TWICE DAILY, Disp: 30 g, Rfl: 1   ferrous sulfate (SLOW IRON ) 160 (50 Fe) MG TBCR SR tablet, Take 1 tablet (160 mg total) by mouth daily. (Patient not taking: Reported on 06/28/2024), Disp: 30 tablet, Rfl: 3   hydrochlorothiazide  (HYDRODIURIL ) 25 MG tablet, TAKE 1 TABLET(25 MG) BY MOUTH DAILY, Disp: 90 tablet, Rfl: 1   hydrocortisone  butyrate (LOCOID ) 0.1 % CREA cream, Apply 1 Application topically 2 (two) times daily., Disp: 45 g, Rfl: 2   losartan  (COZAAR ) 50 MG tablet, TAKE 1 TABLET(50 MG) BY MOUTH DAILY, Disp: 90 tablet, Rfl: 0   Norgestimate -Eth Estradiol  (TRI-LO-MILI) 0.18/0.215/0.25 MG-25 MCG TABS, Take 1 tablet by mouth daily., Disp: 28 tablet, Rfl: 1   Norgestimate -Ethinyl Estradiol  Triphasic (TRI-LO-SPRINTEC) 0.18/0.215/0.25 MG-25 MCG tab, Take 1 tablet by mouth daily. (Patient not taking: Reported on 06/28/2024), Disp: 28 tablet, Rfl: 3   omeprazole  (PRILOSEC) 40 MG capsule, TAKE 1 CAPSULE(40 MG) BY MOUTH DAILY, Disp: 90 capsule, Rfl: 3   predniSONE  (DELTASONE ) 5 MG tablet, TAKE 1 TABLET(5 MG) BY MOUTH DAILY WITH BREAKFAST, Disp: 90 tablet, Rfl: 1      Objective:   There were no vitals filed for this visit.  Estimated body mass index is 44.63 kg/m as calculated from the following:   Height as of 06/28/24: 5' 4 (1.626  m).   Weight as of 06/28/24: 260 lb  (117.9 kg).  @WEIGHTCHANGE @  There were no vitals filed for this visit.   Physical Exam   General: No distress. *** O2 at rest: *** Cane present: *** Sitting in wheel chair: *** Frail: *** Obese: *** Neuro: Alert and Oriented x 3. GCS 15. Speech normal Psych: Pleasant Resp:  Barrel Chest - ***.  Wheeze - ***, Crackles - ***, No overt respiratory distress CVS: Normal heart sounds. Murmurs - *** Ext: Stigmata of Connective Tissue Disease - *** HEENT: Normal upper airway. PEERL +. No post nasal drip        Assessment/     Assessment & Plan Sarcoidosis  Chronic cough  DOE (dyspnea on exertion)  Mediastinal adenopathy  Current chronic use of systemic steroids    PLAN Patient Instructions  Sarcoidosis Chronic cough Dyspnea on exertion Mediastinal adenopathy Current use of chronic steroids   - Shortness of breath and PFT stable on prednisone  5 mg per day.   Some mild cough - Noted and respect reluctance for TNF alpha blockade infliximab because of side effect concern. -PET scan suggestibility but also respect fact that he would like to have another scan done -Noted desire to improve cough with the help of inhaled steroids.   Plan -Continue prednisone  5 mg/day - Do high-resolution CT scan of the chest supine and prone in November 2025 - Try Symbicort  empiric inhaler therapy schedule -Respect no inte -Not interested in methotrexate , or infliximab or clinical trials due to safety concern   HYPERTENSION 180/120 on 12/06/2022 and 132/90 on 02/20/2023 Elevated HgbA1c Jan 2024 and March 2024  - likely prednisone  contributing but hemoglobin A1c recently 6.5  Plan -According to primary care physician   Anemia, unspecified type -noted in October 2021. Persistent on May 2024; hgb 9.9  Seems like Iron  Deficiency anemia. Ongoing May 2024. Not taking iron  pills per personal choice  Plan -monitor and support from primary care physician   Pigmented skin  lesions  -Very suspicious of sarcoidosis.  Flared up in the right dorsum of the hand after recent visit June 2024 and again has flared up on this visit February 2025  Plan - Referred to [facility] dermatology  Rheumatoid factor positive -mildly positive in 2021 and persistent though March 2024 Associted arthralgia  - can be sarcoid arthritis versus RA but glad joint pain improved  and not much this visit 12/06/2022   Plan  - monitor -  - at some point can decide on rheumatology referral   Abnormal CT of liver along with MRI of the liver and spleen in 2021 ad in CT March 2023   Plan - Refer to gastroenterology some time in future   HY   Follow-up - November 2025 face-to-face visit but after CT scan of the chest    FOLLOWUP    No follow-ups on file.    SIGNATURE    Dr. Dorethia Cave, M.D., F.C.C.P,  Pulmonary and Critical Care Medicine Staff Physician, [facility] Center Director - Interstitial Lung Disease  Program  Pulmonary Fibrosis [facility] [place], [place], [address]  Pager: [phone], If no answer or between  15:00h - 7:00h: call [phone] Telephone: [phone]  10:07 PM 08/03/2024   Moderate Complexity MDM OFFICE  2021 E/M guidelines, first released in 2021, with minor revisions added in 2023 and 2024 Must meet the requirements for 2 out of 3 dimensions to qualify.  Number and complexity of problems addressed Amount and/or complexity of data reviewed Risk of complications and/or morbidity  One or more chronic illness with mild exacerbation, OR progression, OR  side effects of treatment  Two or more stable chronic illnesses  One undiagnosed new problem with uncertain prognosis  One acute illness with systemic symptoms   One Acute complicated injury Must meet the requirements for 1 of 3 of the categories)  Category 1: Tests and documents, historian  Any combination of 3 of the  following:  Assessment requiring an independent historian  Review of prior external note(s) from each unique source  Review of results of each unique test  Ordering of each unique test    Category 2: Interpretation of tests   Independent interpretation of a test performed by another physician/other qualified health care professional (not separately reported)  Category 3: Discuss management/tests  Discussion of management or test interpretation with external physician/other qualified health care professional/appropriate source (not separately reported) Moderate risk of morbidity from additional diagnostic testing or treatment Examples only:  Prescription drug management  Decision regarding minor surgery with identfied patient or procedure risk factors  Decision regarding elective major surgery without identified patient or procedure risk factors  Diagnosis or treatment significantly limited by social determinants of health             HIGh Complexity  OFFICE   2021 E/M guidelines, first released in 2021, with minor revisions added in 2023. Must meet the requirements for 2 out of 3 dimensions to qualify.    Number and complexity of problems addressed Amount and/or complexity of data reviewed Risk of complications and/or morbidity  Severe exacerbation of chronic illness  Acute or chronic illnesses that may pose a threat to life or bodily function, e.g., multiple trauma, acute MI, pulmonary embolus, severe respiratory distress, progressive rheumatoid arthritis, psychiatric illness with potential threat to self or others, peritonitis, acute renal failure, abrupt change in neurological status Must meet the requirements for 2 of 3 of the categories)  Category 1: Tests and documents, historian  Any combination of 3 of the following:  Assessment requiring an independent historian  Review of prior external note(s) from each unique source  Review of results of each unique  test  Ordering of each unique test    Category 2: Interpretation of tests    Independent interpretation of a test performed by another physician/other qualified health care professional (not separately reported)  Category 3: Discuss management/tests  Discussion of management or test interpretation with external physician/other qualified health care professional/appropriate source (not separately reported)  HIGH risk of morbidity from additional diagnostic testing or treatment Examples only:  Drug therapy requiring intensive monitoring for toxicity  Decision for elective major surgery with identified pateint or procedure risk factors  Decision regarding hospitalization or escalation of level of care  Decision for DNR or to de-escalate care   Parenteral controlled  substances            LEGEND - Independent interpretation involves the interpretation of a test for which there is a CPT code, and an interpretation or report is customary. When a review and interpretation of a test is performed and documented by the provider, but not separately reported (billed), then this would represent an independent interpretation. This report does not need to conform to the usual standards of a complete report of the test. This does not include interpretation of tests that do not have formal reports such as a complete blood count with  differential and blood cultures. Examples would include reviewing a chest radiograph and documenting in the medical record an interpretation, but not separately reporting (billing) the interpretation of the chest radiograph.   An appropriate source includes professionals who are not health care professionals but may be involved in the management of the patient, such as a clinical research associate, upper officer, case manager or teacher, and does not include discussion with family or informal caregivers.    - SDOH: SDOH are the conditions in the environments where people are  born, live, learn, work, play, worship, and age that affect a wide range of health, functioning, and quality-of-life outcomes and risks. (e.g., housing, food insecurity, transportation, etc.). SDOH-related Z codes ranging from Z55-Z65 are the ICD-10-CM diagnosis codes used to document SDOH data Z55 - Problems related to education and literacy Z56 - Problems related to employment and unemployment Z57 - Occupational exposure to risk factors Z58 - Problems related to physical environment Z59 - Problems related to housing and economic circumstances [phone] - Problems related to social environment [phone] - Problems related to upbringing [phone] - Other problems related to primary support group, including family circumstances Z22 - Problems related to certain psychosocial circumstances Z65 - Problems related to other psychosocial circumstances

## 2024-08-04 ENCOUNTER — Telehealth (INDEPENDENT_AMBULATORY_CARE_PROVIDER_SITE_OTHER): Admitting: Internal Medicine

## 2024-08-04 ENCOUNTER — Encounter: Payer: Self-pay | Admitting: Internal Medicine

## 2024-08-04 VITALS — Ht 64.0 in | Wt 260.0 lb

## 2024-08-04 DIAGNOSIS — R590 Localized enlarged lymph nodes: Secondary | ICD-10-CM | POA: Diagnosis not present

## 2024-08-04 DIAGNOSIS — R0609 Other forms of dyspnea: Secondary | ICD-10-CM

## 2024-08-04 DIAGNOSIS — D869 Sarcoidosis, unspecified: Secondary | ICD-10-CM

## 2024-08-04 DIAGNOSIS — R053 Chronic cough: Secondary | ICD-10-CM

## 2024-08-04 DIAGNOSIS — Z7952 Long term (current) use of systemic steroids: Secondary | ICD-10-CM

## 2024-08-26 ENCOUNTER — Other Ambulatory Visit (HOSPITAL_BASED_OUTPATIENT_CLINIC_OR_DEPARTMENT_OTHER): Payer: Self-pay | Admitting: Family Medicine

## 2024-08-26 DIAGNOSIS — Z1231 Encounter for screening mammogram for malignant neoplasm of breast: Secondary | ICD-10-CM

## 2024-08-29 ENCOUNTER — Inpatient Hospital Stay (HOSPITAL_BASED_OUTPATIENT_CLINIC_OR_DEPARTMENT_OTHER): Admission: RE | Admit: 2024-08-29 | Discharge: 2024-08-29 | Attending: Family Medicine | Admitting: Family Medicine

## 2024-08-29 DIAGNOSIS — Z1231 Encounter for screening mammogram for malignant neoplasm of breast: Secondary | ICD-10-CM

## 2024-09-01 ENCOUNTER — Other Ambulatory Visit: Payer: Self-pay | Admitting: Family Medicine

## 2024-09-13 ENCOUNTER — Other Ambulatory Visit: Payer: Self-pay | Admitting: Family Medicine

## 2024-09-22 ENCOUNTER — Other Ambulatory Visit: Payer: Self-pay | Admitting: Internal Medicine

## 2025-02-17 ENCOUNTER — Ambulatory Visit: Admitting: Internal Medicine
